# Patient Record
Sex: Male | Born: 1985
Health system: Southern US, Community
[De-identification: ages and names within clinical notes are randomized; demographics above are authoritative.]

## PROBLEM LIST (undated history)

## (undated) DIAGNOSIS — U071 COVID-19: Secondary | ICD-10-CM

## (undated) DIAGNOSIS — K589 Irritable bowel syndrome without diarrhea: Secondary | ICD-10-CM

## (undated) DIAGNOSIS — L509 Urticaria, unspecified: Secondary | ICD-10-CM

## (undated) DIAGNOSIS — D693 Immune thrombocytopenic purpura: Secondary | ICD-10-CM

## (undated) DIAGNOSIS — F419 Anxiety disorder, unspecified: Secondary | ICD-10-CM

## (undated) DIAGNOSIS — F319 Bipolar disorder, unspecified: Secondary | ICD-10-CM

## (undated) DIAGNOSIS — G4730 Sleep apnea, unspecified: Secondary | ICD-10-CM

## (undated) DIAGNOSIS — F32A Depression, unspecified: Secondary | ICD-10-CM

## (undated) DIAGNOSIS — I10 Essential (primary) hypertension: Secondary | ICD-10-CM

## (undated) DIAGNOSIS — F329 Major depressive disorder, single episode, unspecified: Secondary | ICD-10-CM

## (undated) DIAGNOSIS — K602 Anal fissure, unspecified: Secondary | ICD-10-CM

## (undated) DIAGNOSIS — R519 Headache, unspecified: Secondary | ICD-10-CM

## (undated) HISTORY — DX: Sleep apnea, unspecified: G47.30

## (undated) HISTORY — DX: Urticaria, unspecified: L50.9

## (undated) HISTORY — PX: COLONOSCOPY: SHX174

## (undated) HISTORY — DX: Headache, unspecified: R51.9

## (undated) HISTORY — DX: Depression, unspecified: F32.A

## (undated) HISTORY — DX: Major depressive disorder, single episode, unspecified: F32.9

## (undated) HISTORY — DX: Bipolar disorder, unspecified: F31.9

## (undated) HISTORY — PX: POLYPECTOMY: SHX149

## (undated) HISTORY — DX: Anal fissure, unspecified: K60.2

---
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
## 2005-03-03 ENCOUNTER — Emergency Department (HOSPITAL_COMMUNITY): Admission: EM | Admit: 2005-03-03 | Discharge: 2005-03-03 | Payer: Self-pay | Admitting: *Deleted

## 2005-07-28 ENCOUNTER — Emergency Department (HOSPITAL_COMMUNITY): Admission: EM | Admit: 2005-07-28 | Discharge: 2005-07-28 | Payer: Self-pay | Admitting: Emergency Medicine

## 2005-11-28 HISTORY — PX: UPPER GASTROINTESTINAL ENDOSCOPY: SHX188

## 2006-02-15 ENCOUNTER — Ambulatory Visit (HOSPITAL_COMMUNITY): Admission: RE | Admit: 2006-02-15 | Discharge: 2006-02-15 | Payer: Self-pay | Admitting: *Deleted

## 2006-02-23 ENCOUNTER — Emergency Department (HOSPITAL_COMMUNITY): Admission: EM | Admit: 2006-02-23 | Discharge: 2006-02-23 | Payer: Self-pay | Admitting: Emergency Medicine

## 2014-04-02 LAB — BASIC METABOLIC PANEL
BUN: 16 mg/dL (ref 4–21)
Creatinine: 1.5 mg/dL — AB (ref 0.6–1.3)
GLUCOSE: 98 mg/dL
POTASSIUM: 4.2 mmol/L (ref 3.4–5.3)
Sodium: 141 mmol/L (ref 137–147)

## 2014-04-02 LAB — CBC AND DIFFERENTIAL
HEMATOCRIT: 39 % — AB (ref 41–53)
HEMOGLOBIN: 14.1 g/dL (ref 13.5–17.5)
PLATELETS: 153 10*3/uL (ref 150–399)
WBC: 5.8 10^3/mL

## 2014-04-02 LAB — TSH: TSH: 1 u[IU]/mL (ref 0.41–5.90)

## 2014-12-29 ENCOUNTER — Encounter: Payer: Self-pay | Admitting: Family Medicine

## 2014-12-29 ENCOUNTER — Ambulatory Visit (INDEPENDENT_AMBULATORY_CARE_PROVIDER_SITE_OTHER): Payer: No Typology Code available for payment source | Admitting: Family Medicine

## 2014-12-29 VITALS — BP 147/92 | HR 116 | Temp 97.9°F | Ht 70.0 in | Wt 178.2 lb

## 2014-12-29 DIAGNOSIS — F329 Major depressive disorder, single episode, unspecified: Secondary | ICD-10-CM

## 2014-12-29 DIAGNOSIS — Z0000 Encounter for general adult medical examination without abnormal findings: Secondary | ICD-10-CM

## 2014-12-29 DIAGNOSIS — R5383 Other fatigue: Secondary | ICD-10-CM | POA: Insufficient documentation

## 2014-12-29 DIAGNOSIS — F331 Major depressive disorder, recurrent, moderate: Secondary | ICD-10-CM | POA: Insufficient documentation

## 2014-12-29 DIAGNOSIS — D693 Immune thrombocytopenic purpura: Secondary | ICD-10-CM

## 2014-12-29 DIAGNOSIS — Z79899 Other long term (current) drug therapy: Secondary | ICD-10-CM | POA: Insufficient documentation

## 2014-12-29 DIAGNOSIS — F32A Depression, unspecified: Secondary | ICD-10-CM

## 2014-12-29 LAB — BASIC METABOLIC PANEL
BUN: 15 mg/dL (ref 6–23)
CALCIUM: 9.3 mg/dL (ref 8.4–10.5)
CO2: 25 mEq/L (ref 19–32)
CREATININE: 1.5 mg/dL — AB (ref 0.50–1.35)
Chloride: 104 mEq/L (ref 96–112)
Glucose, Bld: 96 mg/dL (ref 70–99)
Potassium: 4.1 mEq/L (ref 3.5–5.3)
Sodium: 139 mEq/L (ref 135–145)

## 2014-12-29 LAB — LIPID PANEL
CHOLESTEROL: 180 mg/dL (ref 0–200)
HDL: 52 mg/dL (ref >39)
LDL Cholesterol: 97 mg/dL (ref 0–99)
Total CHOL/HDL Ratio: 3.5 Ratio
Triglycerides: 154 mg/dL — ABNORMAL HIGH (ref <150)
VLDL: 31 mg/dL (ref 0–40)

## 2014-12-29 LAB — CBC
HCT: 42.4 % (ref 39.0–52.0)
HEMOGLOBIN: 15.3 g/dL (ref 13.0–17.0)
MCH: 32.4 pg (ref 26.0–34.0)
MCHC: 36.1 g/dL — AB (ref 30.0–36.0)
MCV: 89.8 fL (ref 78.0–100.0)
MPV: 8.4 fL — AB (ref 8.6–12.4)
Platelets: 148 10*3/uL — ABNORMAL LOW (ref 150–400)
RBC: 4.72 MIL/uL (ref 4.22–5.81)
RDW: 13.4 % (ref 11.5–15.5)
WBC: 8.5 10*3/uL (ref 4.0–10.5)

## 2014-12-29 NOTE — Assessment & Plan Note (Signed)
Patient presents to establish care. He wishes to discuss fatigue/insomnia. I think that this is multifactorial from MDD and Anxiety. He has attempted multiple medications for sleeping including Trazadone/Melatonin/and Diphenhydramine. Has had some benefit from Ambien but wants to continue to wean. Previously found to be B12 deficient and Vitamin D deficient however did not respond to therapy. Currently on Seroquel and Klonopin for treatment of MDD. - check basic labs including TSH/CBC/BMP - encouraged increasing exercise to 4-5 times per week to naturally increase serotonin and to increase physical fatigue which may help with sleep

## 2014-12-29 NOTE — Patient Instructions (Signed)
It is nice to meet you today.  Poor sleep - lets attempt to increase daily exercise to 4-5 times per week, also check lab work including thyroid  Also check basic lab work, Dr. Ree Kida will call you with those results.

## 2014-12-29 NOTE — Progress Notes (Signed)
   Subjective:    Patient ID: Douglas Knight, male    DOB: 04-May-1986, 29 y.o.   MRN: [id number]  HPI 29 y/o male presents to establish care.   Reviewed New Patient Health History. See scanned document. Reviewed and updated PMH/PSH/Medications/Allergies/Social history in EPIC.   Patient would like to discuss insomnia and chronic fatigue. Patient does have a history of MDD which is currently treated by Dr. Pearson Grippe (psychiatry) and Dr. Barnabas Lister (psychology). Taking Seroquel, Klonopin, and Ambien currently. Reports difficulty falling asleep and awakes frequently, will often miss early morning appointments as he is unable to wake up, has attempted multiple medications for sleep including Trazodone, Melatonin, and Diphenhydramine. 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 Negative for fever and chills.  Respiratory: Negative for cough and shortness of breath.   Cardiovascular: Negative for chest pain.  Gastrointestinal: Negative for nausea, vomiting and diarrhea.       Objective:   Physical Exam Vitals: reviewed HEENT: normocephalic, PERRL, EOMI, no scleral icterus, MMM, neck supple, no thyromegaly Cardiac: tachycardic, S1 and S2 present, no murmur, no heaves/thrills Resp: CTAB, normal effort ABD: soft, no tenderness, normal bowel sounds Ext: no edema Skin: no rash Psych: dressed  appropriately, affects is outgoing, mood is described as down/depressed, normal thought process, does not appear to be reacting to internal stimuli     Assessment & Plan:  Please see problem specific assessment and plan.

## 2014-12-30 ENCOUNTER — Telehealth: Payer: Self-pay | Admitting: Family Medicine

## 2014-12-30 DIAGNOSIS — R7989 Other specified abnormal findings of blood chemistry: Secondary | ICD-10-CM

## 2014-12-30 LAB — TSH: TSH: 3.611 u[IU]/mL (ref 0.350–4.500)

## 2014-12-31 DIAGNOSIS — R7989 Other specified abnormal findings of blood chemistry: Secondary | ICD-10-CM | POA: Insufficient documentation

## 2014-12-31 NOTE — Telephone Encounter (Signed)
Pt scheduled Wednesday feb 10th @ 8:30. Deseree Kennon Holter, CMA

## 2014-12-31 NOTE — Telephone Encounter (Signed)
Discussed lab results. Triglycerides mildly elevated however patient had eaten prior to exam. Will recheck in one year. Cr slightly elevated. Denies previous renal disease, no current supplements. Will check UA and BMP in one week.  Note to nursing staff - please call to schedule lab draw for BMP and UA in one week.

## 2015-01-07 ENCOUNTER — Other Ambulatory Visit: Payer: No Typology Code available for payment source

## 2015-01-07 DIAGNOSIS — R7989 Other specified abnormal findings of blood chemistry: Secondary | ICD-10-CM

## 2015-01-07 LAB — BASIC METABOLIC PANEL
BUN: 11 mg/dL (ref 6–23)
CALCIUM: 9.4 mg/dL (ref 8.4–10.5)
CO2: 24 mEq/L (ref 19–32)
CREATININE: 1.34 mg/dL (ref 0.50–1.35)
Chloride: 105 mEq/L (ref 96–112)
GLUCOSE: 84 mg/dL (ref 70–99)
Potassium: 3.8 mEq/L (ref 3.5–5.3)
SODIUM: 141 meq/L (ref 135–145)

## 2015-01-07 NOTE — Progress Notes (Signed)
Bmp done today Douglas Knight 

## 2015-01-09 NOTE — Progress Notes (Signed)
Left voicemail stating normal results, call if he has questions.

## 2015-01-15 ENCOUNTER — Emergency Department (HOSPITAL_COMMUNITY): Payer: No Typology Code available for payment source

## 2015-01-15 ENCOUNTER — Encounter (HOSPITAL_COMMUNITY): Payer: Self-pay | Admitting: *Deleted

## 2015-01-15 ENCOUNTER — Telehealth: Payer: Self-pay | Admitting: Family Medicine

## 2015-01-15 ENCOUNTER — Emergency Department (HOSPITAL_COMMUNITY)
Admission: EM | Admit: 2015-01-15 | Discharge: 2015-01-15 | Disposition: A | Discharge status: Home / Self Care | Payer: No Typology Code available for payment source | Attending: Emergency Medicine | Admitting: Emergency Medicine

## 2015-01-15 DIAGNOSIS — Z79899 Other long term (current) drug therapy: Secondary | ICD-10-CM | POA: Insufficient documentation

## 2015-01-15 DIAGNOSIS — R0602 Shortness of breath: Secondary | ICD-10-CM | POA: Diagnosis present

## 2015-01-15 DIAGNOSIS — R200 Anesthesia of skin: Secondary | ICD-10-CM | POA: Insufficient documentation

## 2015-01-15 DIAGNOSIS — R0689 Other abnormalities of breathing: Secondary | ICD-10-CM | POA: Insufficient documentation

## 2015-01-15 DIAGNOSIS — R110 Nausea: Secondary | ICD-10-CM | POA: Insufficient documentation

## 2015-01-15 DIAGNOSIS — F419 Anxiety disorder, unspecified: Secondary | ICD-10-CM | POA: Insufficient documentation

## 2015-01-15 LAB — I-STAT VENOUS BLOOD GAS, ED
BICARBONATE: 23.2 meq/L (ref 20.0–24.0)
O2 SAT: 78 %
PCO2 VEN: 34.1 mmHg — AB (ref 45.0–50.0)
PO2 VEN: 40 mmHg (ref 30.0–45.0)
TCO2: 24 mmol/L (ref 0–100)
pH, Ven: 7.44 — ABNORMAL HIGH (ref 7.250–7.300)

## 2015-01-15 LAB — CBC
HCT: 41.9 % (ref 39.0–52.0)
HEMOGLOBIN: 15.3 g/dL (ref 13.0–17.0)
MCH: 32.8 pg (ref 26.0–34.0)
MCHC: 36.5 g/dL — AB (ref 30.0–36.0)
MCV: 89.9 fL (ref 78.0–100.0)
PLATELETS: 130 10*3/uL — AB (ref 150–400)
RBC: 4.66 MIL/uL (ref 4.22–5.81)
RDW: 12.5 % (ref 11.5–15.5)
WBC: 4.6 10*3/uL (ref 4.0–10.5)

## 2015-01-15 LAB — I-STAT TROPONIN, ED: Troponin i, poc: 0 ng/mL (ref 0.00–0.08)

## 2015-01-15 LAB — BASIC METABOLIC PANEL
ANION GAP: 8 (ref 5–15)
BUN: 9 mg/dL (ref 6–23)
CALCIUM: 9.3 mg/dL (ref 8.4–10.5)
CO2: 27 mmol/L (ref 19–32)
CREATININE: 1.36 mg/dL — AB (ref 0.50–1.35)
Chloride: 104 mmol/L (ref 96–112)
GFR calc Af Amer: 80 mL/min — ABNORMAL LOW (ref >90)
GFR, EST NON AFRICAN AMERICAN: 69 mL/min — AB (ref >90)
Glucose, Bld: 131 mg/dL — ABNORMAL HIGH (ref 70–99)
Potassium: 3.5 mmol/L (ref 3.5–5.1)
SODIUM: 139 mmol/L (ref 135–145)

## 2015-01-15 LAB — BRAIN NATRIURETIC PEPTIDE: B NATRIURETIC PEPTIDE 5: 3.9 pg/mL (ref 0.0–100.0)

## 2015-01-15 MED ORDER — LORAZEPAM 1 MG PO TABS
2.0000 mg | ORAL_TABLET | Freq: Once | ORAL | Status: AC
  Administered 2015-01-15: 2 mg via ORAL
  Filled 2015-01-15: qty 2

## 2015-01-15 MED ORDER — LORAZEPAM 1 MG PO TABS: 1.0000 mg | ORAL_TABLET | Freq: Once | ORAL | Status: DC

## 2015-01-15 NOTE — Telephone Encounter (Signed)
Erman Thum is a 29 y.o. ,Pt calls the after hours line to discuss his breathing. He states he is unable to catch his full breath when he "yawns". He has no history of shortness of breath, asthma, COPD, lung disease, CHF etc. He does have a history of depression/anxity, and ITP. He denies fever or cough, but states he is dizzy. He is treated by psychiatry Albertine Patricia) and psychology Ruthann Cancer).  - Unlikely lung pathology. Pt sounded very anxious on the phone. He was able to speak ijn full sentences without sounding winded and if anything had pressured speech.  He is a new pt to the clinic as of 2/1, thus not much information available in the records.  - Advised the patient if he felt he could wait until the morning he could make a same day appointment. If he felt that is breathing was difficult, labored or his dizziness was from his "nit being able to catch: his breath than he could go to an urgent care.  Howard Pouch DO PGY3 [facility]

## 2015-01-15 NOTE — ED Notes (Signed)
Rapid respiratiions with soime hand and arm numbness

## 2015-01-15 NOTE — ED Provider Notes (Signed)
CSN: [id number]     Arrival date & time 01/15/15  0244 History  This chart was scribed for Everlene Balls, MD by Rayfield Citizen, ED Scribe. This patient was seen in room A06C/A06C and the patient's care was started at 3:45 AM.    Chief Complaint  Patient presents with  . Shortness of Breath   The history is provided by the patient. No language interpreter was used.    HPI Comments: Douglas Knight is a 29 y.o. male who presents to the Emergency Department complaining of SOB. Patient reports a "constant yawning" sensation with nausea, lightheadedness and "numbness" beginning tonight around 22:00 while watching basketball on TV. He notes a recent nonproductive cough. He denies chest pain, vomiting, abdominal pain, recent travel, recent surgery, recent illness.   History reviewed. No pertinent past medical history. Past Surgical History  Procedure Laterality Date  . Appendectomy  2003    [facility]   Family History  Problem Relation Age of Onset  . Diabetes Father   . Hypertension Father   . Heart disease Maternal Grandfather   . Alzheimer's disease Paternal Grandmother   . Colon cancer Paternal Grandfather   . Depression Paternal Grandfather    History  Substance Use Topics  . Smoking status: Never Smoker   . Smokeless tobacco: Not on file  . Alcohol Use: No    Review of Systems  Psychiatric/Behavioral: The patient is nervous/anxious.   All other systems reviewed and are negative.   Allergies  Sulfa antibiotics and Wellbutrin  Home Medications   Prior to Admission medications   Medication Sig Start Date End Date Taking? Authorizing Provider  clonazePAM (KLONOPIN) 0.5 MG tablet Take 0.5 mg by mouth 2 (two) times daily as needed for anxiety.   Yes Historical Provider, MD  QUEtiapine (SEROQUEL) 300 MG tablet Take 300 mg by mouth at bedtime.   Yes Historical Provider, MD  Vortioxetine HBr 5 MG TABS Take 1 tablet by mouth daily.   Yes Historical Provider, MD   zolpidem (AMBIEN) 10 MG tablet Take 5 mg by mouth at bedtime as needed for sleep.   Yes Historical Provider, MD   BP 156/94 mmHg  Pulse 98  Temp(Src) 97.7 F (36.5 C) (Oral)  Resp 11  Ht 5\' 10"  (1.778 m)  Wt 175 lb (79.379 kg)  BMI 25.11 kg/m2  SpO2 100% Physical Exam  Constitutional: He is oriented to person, place, and time. Vital signs are normal. He appears well-developed and well-nourished.  Non-toxic appearance. He does not appear ill. No distress.  HENT:  Head: Normocephalic and atraumatic.  Nose: Nose normal.  Mouth/Throat: Oropharynx is clear and moist. No oropharyngeal exudate.  Eyes: Conjunctivae and EOM are normal. Pupils are equal, round, and reactive to light. No scleral icterus.  Neck: Normal range of motion. Neck supple. No tracheal deviation, no edema, no erythema and normal range of motion present. No thyroid mass and no thyromegaly present.  Cardiovascular: Normal rate, regular rhythm, S1 normal, S2 normal, normal heart sounds, intact distal pulses and normal pulses.  Exam reveals no gallop and no friction rub.   No murmur heard. Pulses:      Radial pulses are 2+ on the right side, and 2+ on the left side.       Dorsalis pedis pulses are 2+ on the right side, and 2+ on the left side.  Pulmonary/Chest: Effort normal and breath sounds normal. No respiratory distress. He has no wheezes. He has no rhonchi. He has no rales.  Abdominal: Soft. Normal appearance and bowel sounds are normal. He exhibits no distension, no ascites and no mass. There is no hepatosplenomegaly. There is no tenderness. There is no rebound, no guarding and no CVA tenderness.  Musculoskeletal: Normal range of motion. He exhibits no edema or tenderness.  Lymphadenopathy:    He has no cervical adenopathy.  Neurological: He is alert and oriented to person, place, and time. He has normal strength. No cranial nerve deficit or sensory deficit.  Skin: Skin is warm, dry and intact. No petechiae and no rash  noted. He is not diaphoretic. No erythema. No pallor.  Psychiatric: His behavior is normal. Judgment normal. His mood appears anxious.  Nursing note and vitals reviewed.   ED Course  Procedures   DIAGNOSTIC STUDIES: Oxygen Saturation is 99% on RA, normal by my interpretation.    COORDINATION OF CARE: 3:55 AM Discussed treatment plan with pt at bedside and pt agreed to plan.   Labs Review Labs Reviewed  CBC - Abnormal; Notable for the following:    MCHC 36.5 (*)    Platelets 130 (*)    All other components within normal limits  BASIC METABOLIC PANEL - Abnormal; Notable for the following:    Glucose, Bld 131 (*)    Creatinine, Ser 1.36 (*)    GFR calc non Af Amer 69 (*)    GFR calc Af Amer 80 (*)    All other components within normal limits  I-STAT VENOUS BLOOD GAS, ED - Abnormal; Notable for the following:    pH, Ven 7.440 (*)    pCO2, Ven 34.1 (*)    All other components within normal limits  BRAIN NATRIURETIC PEPTIDE  I-STAT TROPOININ, ED    Imaging Review Dg Chest 2 View  01/15/2015   CLINICAL DATA:  Nausea vomiting dyspnea  EXAM: CHEST  2 VIEW  COMPARISON:  None.  FINDINGS: The heart size and mediastinal contours are within normal limits. Both lungs are clear. The visualized skeletal structures are unremarkable.  IMPRESSION: No active cardiopulmonary disease.   Electronically Signed   By: Andreas Newport M.D.   On: 01/15/2015 03:11     EKG Interpretation   Date/Time:  Thursday January 15 2015 04:07:12 EST Ventricular Rate:  85 PR Interval:  150 QRS Duration: 94 QT Interval:  344 QTC Calculation: 409 R Axis:   90 Text Interpretation:  Sinus rhythm Borderline right axis deviation RSR' in  V1 or V2, probably normal variant Confirmed by Glynn Octave  [phone]) on 01/15/2015 4:20:31 AM      MDM   Final diagnoses:  None  Patient presents to the ED for SOB.  This occurred spontaneously while watching basketball.  He denies any chest pain.  This is  consistent with his anxiety, however it is lasting longer than normal.  He took his normal home meds without relief.  He denies any chest pain at anytime.  He has no risk factors for PE and his history is not consistent with ACS, pericarditis, or dissection.  His tachycardia has resolved after ativan 2mg  PO, HR 97 at 06:04am.  He was advised to follow up with his primary care physician.  His VS remain within his normal limits and he is safe for DC.   I personally performed the services described in this documentation, which was scribed in my presence. The recorded information has been reviewed and is accurate.   Everlene Balls, MD 01/15/15 [phone]

## 2015-01-15 NOTE — Discharge Instructions (Signed)
Shortness of Breath Douglas Knight, your blood work, ekg, and chest xray were normal.  Follow up with your regular physician within 3 days. If symptoms worsen come back to emergency department immediately. Thank you. Shortness of breath means you have trouble breathing. Shortness of breath needs medical care right away. HOME CARE   Do not smoke.  Avoid being around chemicals or things (paint fumes, dust) that may bother your breathing.  Rest as needed. Slowly begin your normal activities.  Only take medicines as told by your doctor.  Keep all doctor visits as told. GET HELP RIGHT AWAY IF:   Your shortness of breath gets worse.  You feel lightheaded, pass out (faint), or have a cough that is not helped by medicine.  You cough up blood.  You have pain with breathing.  You have pain in your chest, arms, shoulders, or belly (abdomen).  You have a fever.  You cannot walk up stairs or exercise the way you normally do.  You do not get better in the time expected.  You have a hard time doing normal activities even with rest.  You have problems with your medicines.  You have any new symptoms. MAKE SURE YOU:  Understand these instructions.  Will watch your condition.  Will get help right away if you are not doing well or get worse. Document Released: 05/02/2008 Document Revised: 11/19/2013 Document Reviewed: 01/30/2012 [facility] Patient Information 2015 [place], [place]. This information is not intended to replace advice given to you by your health care provider. Make sure you discuss any questions you have with your health care provider.

## 2015-01-15 NOTE — ED Notes (Signed)
The pt is c/o sob while watching basketball approx 2 hours ago. No pain.  Hx iof anxiety attacks.  He took klonopin 6 hours ago

## 2015-01-15 NOTE — ED Notes (Signed)
MD at bedside. 

## 2015-02-01 ENCOUNTER — Emergency Department (HOSPITAL_COMMUNITY): Payer: No Typology Code available for payment source

## 2015-02-01 ENCOUNTER — Encounter (HOSPITAL_COMMUNITY): Payer: Self-pay | Admitting: Emergency Medicine

## 2015-02-01 ENCOUNTER — Encounter (HOSPITAL_COMMUNITY): Payer: Self-pay | Admitting: *Deleted

## 2015-02-01 ENCOUNTER — Emergency Department (HOSPITAL_COMMUNITY)
Admission: EM | Admit: 2015-02-01 | Discharge: 2015-02-01 | Disposition: A | Discharge status: Home / Self Care | Payer: No Typology Code available for payment source | Attending: Emergency Medicine | Admitting: Emergency Medicine

## 2015-02-01 ENCOUNTER — Emergency Department (INDEPENDENT_AMBULATORY_CARE_PROVIDER_SITE_OTHER)
Admission: EM | Admit: 2015-02-01 | Discharge: 2015-02-01 | Disposition: A | Discharge status: Nursing Home (Includes ICF) | Payer: No Typology Code available for payment source | Source: Home / Self Care | Attending: Emergency Medicine | Admitting: Emergency Medicine

## 2015-02-01 DIAGNOSIS — F458 Other somatoform disorders: Secondary | ICD-10-CM | POA: Diagnosis not present

## 2015-02-01 DIAGNOSIS — R0602 Shortness of breath: Secondary | ICD-10-CM

## 2015-02-01 DIAGNOSIS — Z862 Personal history of diseases of the blood and blood-forming organs and certain disorders involving the immune mechanism: Secondary | ICD-10-CM | POA: Insufficient documentation

## 2015-02-01 DIAGNOSIS — I10 Essential (primary) hypertension: Secondary | ICD-10-CM | POA: Diagnosis not present

## 2015-02-01 DIAGNOSIS — R202 Paresthesia of skin: Secondary | ICD-10-CM | POA: Insufficient documentation

## 2015-02-01 DIAGNOSIS — Z79899 Other long term (current) drug therapy: Secondary | ICD-10-CM | POA: Insufficient documentation

## 2015-02-01 DIAGNOSIS — F419 Anxiety disorder, unspecified: Secondary | ICD-10-CM | POA: Diagnosis not present

## 2015-02-01 HISTORY — DX: Anxiety disorder, unspecified: F41.9

## 2015-02-01 HISTORY — DX: Immune thrombocytopenic purpura: D69.3

## 2015-02-01 HISTORY — DX: Essential (primary) hypertension: I10

## 2015-02-01 LAB — POCT I-STAT, CHEM 8
BUN: 7 mg/dL (ref 6–23)
CREATININE: 1.3 mg/dL (ref 0.50–1.35)
Calcium, Ion: 1.14 mmol/L (ref 1.12–1.23)
Chloride: 104 mmol/L (ref 96–112)
GLUCOSE: 93 mg/dL (ref 70–99)
HCT: 42 % (ref 39.0–52.0)
Hemoglobin: 14.3 g/dL (ref 13.0–17.0)
Potassium: 3.4 mmol/L — ABNORMAL LOW (ref 3.5–5.1)
Sodium: 141 mmol/L (ref 135–145)
TCO2: 20 mmol/L (ref 0–100)

## 2015-02-01 LAB — CBC WITH DIFFERENTIAL/PLATELET
BASOS PCT: 1 % (ref 0–1)
Basophils Absolute: 0.1 10*3/uL (ref 0.0–0.1)
Eosinophils Absolute: 0.1 10*3/uL (ref 0.0–0.7)
Eosinophils Relative: 1 % (ref 0–5)
HCT: 39.4 % (ref 39.0–52.0)
HEMOGLOBIN: 14.4 g/dL (ref 13.0–17.0)
LYMPHS ABS: 1.3 10*3/uL (ref 0.7–4.0)
Lymphocytes Relative: 15 % (ref 12–46)
MCH: 32.3 pg (ref 26.0–34.0)
MCHC: 36.5 g/dL — AB (ref 30.0–36.0)
MCV: 88.3 fL (ref 78.0–100.0)
MONO ABS: 0.6 10*3/uL (ref 0.1–1.0)
Monocytes Relative: 7 % (ref 3–12)
NEUTROS ABS: 6.6 10*3/uL (ref 1.7–7.7)
Neutrophils Relative %: 76 % (ref 43–77)
Platelets: 143 10*3/uL — ABNORMAL LOW (ref 150–400)
RBC: 4.46 MIL/uL (ref 4.22–5.81)
RDW: 12.1 % (ref 11.5–15.5)
WBC: 8.5 10*3/uL (ref 4.0–10.5)

## 2015-02-01 LAB — BASIC METABOLIC PANEL
ANION GAP: 4 — AB (ref 5–15)
BUN: 7 mg/dL (ref 6–23)
CALCIUM: 8.8 mg/dL (ref 8.4–10.5)
CHLORIDE: 105 mmol/L (ref 96–112)
CO2: 30 mmol/L (ref 19–32)
Creatinine, Ser: 1.36 mg/dL — ABNORMAL HIGH (ref 0.50–1.35)
GFR calc non Af Amer: 69 mL/min — ABNORMAL LOW (ref >90)
GFR, EST AFRICAN AMERICAN: 80 mL/min — AB (ref >90)
Glucose, Bld: 96 mg/dL (ref 70–99)
POTASSIUM: 3.5 mmol/L (ref 3.5–5.1)
Sodium: 139 mmol/L (ref 135–145)

## 2015-02-01 MED ORDER — IPRATROPIUM-ALBUTEROL 0.5-2.5 (3) MG/3ML IN SOLN
3.0000 mL | Freq: Once | RESPIRATORY_TRACT | Status: AC
  Administered 2015-02-01: 3 mL via RESPIRATORY_TRACT
  Filled 2015-02-01: qty 3

## 2015-02-01 MED ORDER — MORPHINE SULFATE 2 MG/ML IJ SOLN
2.0000 mg | Freq: Once | INTRAMUSCULAR | Status: AC
  Administered 2015-02-01: 2 mg via INTRAMUSCULAR

## 2015-02-01 MED ORDER — MORPHINE SULFATE 2 MG/ML IJ SOLN
INTRAMUSCULAR | Status: AC
  Filled 2015-02-01: qty 1

## 2015-02-01 MED ORDER — LORAZEPAM 1 MG PO TABS
1.0000 mg | ORAL_TABLET | Freq: Once | ORAL | Status: AC
  Administered 2015-02-01: 1 mg via ORAL
  Filled 2015-02-01: qty 1

## 2015-02-01 NOTE — ED Notes (Signed)
Patient transported to X-ray 

## 2015-02-01 NOTE — ED Provider Notes (Signed)
CSN: [id number]     Arrival date & time 02/01/15  1836 History   First MD Initiated Contact with Patient 02/01/15 1915     Chief Complaint  Patient presents with  . Shortness of Breath  . Tingling     (Consider location/radiation/quality/duration/timing/severity/associated sxs/prior Treatment) HPI Douglas Knight is a 29 y.o. male with history of hypertension, anxiety who comes in for evaluation of shortness of breath. Patient states he has multiple episodes where he feels like he is "constantly yawning" and is unable to catch his breath. He reports being seen last week in the ED for similar symptoms and was discharged home. He saw his PCP today at an urgent care center and was transferred to the ED for further evaluation. Patient reports associated generalized tingling diffusely throughout his chest and abdomen with no overt pain. He also reports 1 episode of nausea today without vomiting that was fleeting. He reports being recently diagnosed with hypertension, started on amlodipine which he took today, but that did not resolve his symptoms. Denies any hemoptysis, for/history of cancer, leg swelling, recent travels or surgeries.  Past Medical History  Diagnosis Date  . Hypertension   . Anxiety   . ITP (idiopathic thrombocytopenic purpura)    Past Surgical History  Procedure Laterality Date  . Appendectomy  2003    [facility]   Family History  Problem Relation Age of Onset  . Diabetes Father   . Hypertension Father   . Heart disease Maternal Grandfather   . Alzheimer's disease Paternal Grandmother   . Colon cancer Paternal Grandfather   . Depression Paternal Grandfather    History  Substance Use Topics  . Smoking status: Never Smoker   . Smokeless tobacco: Not on file  . Alcohol Use: No    Review of Systems A 10 point review of systems was completed and was negative except for pertinent positives and negatives as mentioned in the history of present  illness     Allergies  Sulfa antibiotics and Wellbutrin  Home Medications   Prior to Admission medications   Medication Sig Start Date End Date Taking? Authorizing Provider  ALPRAZolam Duanne Moron) 1 MG tablet Take 0.5-1 mg by mouth 3 (three) times daily as needed for anxiety.   Yes Historical Provider, MD  amLODipine (NORVASC) 5 MG tablet Take 5 mg by mouth daily.   Yes Historical Provider, MD  QUEtiapine (SEROQUEL) 200 MG tablet Take 200 mg by mouth at bedtime.   Yes Historical Provider, MD  Vortioxetine HBr 10 MG TABS Take 10 mg by mouth every morning.   Yes Historical Provider, MD  zolpidem (AMBIEN) 10 MG tablet Take 5 mg by mouth at bedtime.    Yes Historical Provider, MD   BP 141/88 mmHg  Pulse 89  Temp(Src) 97.9 F (36.6 C) (Oral)  Resp 16  Ht 5\' 10"  (1.778 m)  Wt 175 lb (79.379 kg)  BMI 25.11 kg/m2  SpO2 100% Physical Exam  Constitutional: He is oriented to person, place, and time. He appears well-developed and well-nourished.  Appears anxious   HENT:  Head: Normocephalic and atraumatic.  Mouth/Throat: Oropharynx is clear and moist.  Eyes: Conjunctivae are normal. Pupils are equal, round, and reactive to light. Right eye exhibits no discharge. Left eye exhibits no discharge. No scleral icterus.  Neck: Neck supple.  Cardiovascular: Normal rate, regular rhythm and normal heart sounds.   Pulmonary/Chest: Effort normal and breath sounds normal. No respiratory distress. He has no wheezes. He has no rales.  Taking deep breaths  Abdominal: Soft. There is no tenderness.  Musculoskeletal: He exhibits no edema or tenderness.  Neurological: He is alert and oriented to person, place, and time.  Cranial Nerves II-XII grossly intact  Skin: Skin is warm and dry. No rash noted.  Psychiatric: He has a normal mood and affect.  Nursing note and vitals reviewed.   ED Course  Procedures (including critical care time) Labs Review Labs Reviewed  BASIC METABOLIC PANEL - Abnormal;  Notable for the following:    Creatinine, Ser 1.36 (*)    GFR calc non Af Amer 69 (*)    GFR calc Af Amer 80 (*)    Anion gap 4 (*)    All other components within normal limits  CBC WITH DIFFERENTIAL/PLATELET - Abnormal; Notable for the following:    MCHC 36.5 (*)    Platelets 143 (*)    All other components within normal limits  I-STAT TROPOININ, ED    Imaging Review Dg Chest 2 View  02/01/2015   CLINICAL DATA:  Shortness of breath, onset last night at 11 o'clock while lying down. No cold symptoms. Headache telephone on last night and today. Dizziness. Seen in the ED 1 month ago for the same symptoms. Elevated blood pressure. Aching and tingling in the left upper abdomen and left lower chest. Nausea and gagging.  EXAM: CHEST  2 VIEW  COMPARISON:  01/15/2015  FINDINGS: The heart size and mediastinal contours are within normal limits. Both lungs are clear. The visualized skeletal structures are unremarkable.  IMPRESSION: No active cardiopulmonary disease.   Electronically Signed   By: Lucienne Capers M.D.   On: 02/01/2015 20:07     EKG Interpretation   Date/Time:  Sunday February 01 2015 18:51:44 EST Ventricular Rate:  74 PR Interval:  151 QRS Duration: 97 QT Interval:  381 QTC Calculation: 423 R Axis:   91 Text Interpretation:  Sinus rhythm Borderline right axis deviation RSR' in  V1 or V2, probably normal variant since last tracing no significant change  Confirmed by [facility]  MD, ELLIOTT [phone]) on 02/01/2015 7:44:44 PM     Meds given in ED:  Medications  ipratropium-albuterol (DUONEB) 0.5-2.5 (3) MG/3ML nebulizer solution 3 mL (3 mLs Nebulization Given 02/01/15 2019)  LORazepam (ATIVAN) tablet 1 mg (1 mg Oral Given 02/01/15 2201)    Discharge Medication List as of 02/01/2015 10:25 PM     Filed Vitals:   02/01/15 2130 02/01/15 2145 02/01/15 2200 02/01/15 2243  BP: 144/87 142/83 150/83 141/88  Pulse: 99 100 94 89  Temp:    97.9 F (36.6 C)  TempSrc:    Oral  Resp:   17 16   Height:      Weight:      SpO2: 100% 98% 99% 100%    MDM  Vitals stable - WNL -afebrile, maintains saturations 99% on room air Pt resting comfortably in ED. PE--upon reevaluation, patient is very calm, no evidence of respiratory distress, and normal respirations Labwork noncontributory. Troponin negative, EKG reassuring Imaging--chest x-ray shows no acute cardiopulmonary pathology  DDX--clinical picture not consistent with ACS. Patient is perc negative. Low suspicion for dissection, esophageal rupture. No evidence of DKA. Symptoms improved with 1mg  PO Ativan, symptoms potentially due to psychogenic process. Discussed follow-up with psychiatrist for further evaluation and management of psych meds.  I discussed all relevant lab findings and imaging results with pt and they verbalized understanding. Discussed f/u with PCP within 48 hrs and return precautions, pt very amenable to  plan. Patient stable, in good condition and is appropriate for discharge  Final diagnoses:  SOB (shortness of breath)        Verl Dicker, PA-C 02/02/15 2761  Daleen Bo, MD 02/02/15 1110

## 2015-02-01 NOTE — ED Provider Notes (Addendum)
CSN: [id number]     Arrival date & time 02/01/15  1523 History   First MD Initiated Contact with Patient 02/01/15 1530     Chief Complaint  Patient presents with  . Shortness of Breath   (Consider location/radiation/quality/duration/timing/severity/associated sxs/prior Treatment) HPI  He is a 29 year old man here for evaluation of shortness of breath. He states this started last night. He states he feels like he just cannot get his breath. He also reports dizziness, numbness in his fingers and toes, and some discomfort in his left chest and abdomen. He reports nausea, but no vomiting. No fevers or cough. He states he had a similar episode about a month ago. At that time he was evaluated in the ER and treated with some Ativan. He states that shortly after that visit to the ER he was seen by another doctor and found to have elevated blood pressure and was treated with amlodipine. He has tried amlodipine today, but it has not improved his shortness of breath.  He has a history of depression and anxiety. No history of asthma. He is a nonsmoker.  History reviewed. No pertinent past medical history. Past Surgical History  Procedure Laterality Date  . Appendectomy  2003    [facility]   Family History  Problem Relation Age of Onset  . Diabetes Father   . Hypertension Father   . Heart disease Maternal Grandfather   . Alzheimer's disease Paternal Grandmother   . Colon cancer Paternal Grandfather   . Depression Paternal Grandfather    History  Substance Use Topics  . Smoking status: Never Smoker   . Smokeless tobacco: Not on file  . Alcohol Use: No    Review of Systems  Constitutional: Negative for fever and chills.  HENT: Negative for congestion and rhinorrhea.   Respiratory: Positive for shortness of breath. Negative for cough and wheezing.   Cardiovascular: Positive for chest pain.  Gastrointestinal: Positive for nausea and abdominal pain. Negative for vomiting.   Musculoskeletal: Negative for myalgias.  Neurological: Positive for dizziness and numbness.    Allergies  Sulfa antibiotics and Wellbutrin  Home Medications   Prior to Admission medications   Medication Sig Start Date End Date Taking? Authorizing Provider  ALPRAZolam Duanne Moron) 1 MG tablet Take 1 mg by mouth at bedtime as needed for anxiety.   Yes Historical Provider, MD  QUEtiapine (SEROQUEL) 300 MG tablet Take 300 mg by mouth at bedtime.   Yes Historical Provider, MD  Vortioxetine HBr 5 MG TABS Take 2 tablets by mouth daily.    Yes Historical Provider, MD  zolpidem (AMBIEN) 10 MG tablet Take 5 mg by mouth at bedtime as needed for sleep.   Yes Historical Provider, MD  clonazePAM (KLONOPIN) 0.5 MG tablet Take 0.5 mg by mouth 2 (two) times daily as needed for anxiety.    Historical Provider, MD   BP 132/76 mmHg  Pulse 104  Temp(Src) 97.9 F (36.6 C) (Oral)  Resp 24  SpO2 100% Physical Exam  Constitutional: He is oriented to person, place, and time. He appears well-developed and well-nourished. He appears distressed (looks anxious, taking deep breaths.).  Cardiovascular: Regular rhythm and normal heart sounds.  Tachycardia present.   No murmur heard. Pulmonary/Chest: Breath sounds normal. He has no wheezes. He has no rales.  He is taking deep breaths every few seconds.  Neurological: He is alert and oriented to person, place, and time.    ED Course  ED EKG  Date/Time: 02/01/2015 4:15 PM Performed  by: Melony Overly Authorized by: Melony Overly Interpreted by ED physician Comparison: compared with previous ECG from 01/15/2015 Similar to previous ECG Rhythm: sinus rhythm Rate: normal BPM: 90 QRS axis: right Conduction: conduction normal ST Segments: ST segments normal T Waves: T waves normal Clinical impression: abnormal ECG Comments: Rightward axis, but stable from prior EKG.  I have personally reviewed this EKG and agree with the computer printout.   (including critical  care time) Labs Review Labs Reviewed  POCT I-STAT, CHEM 8 - Abnormal; Notable for the following:    Potassium 3.4 (*)    All other components within normal limits    Imaging Review No results found.   MDM   1. Hyperventilation syndrome    Morphine 2 mg IM given for air hunger.  He is hyperventilating. This is likely causing his other symptoms. I do not have a clear reason for him to hyperventilate. He did start all of his psychiatric medications shortly before his first episode of hyperventilation. No real improvement after morphine. I'm concerned about discharging him home in his current state as I think he will likely pass out from hyperventilation. We'll transfer to [facility] ER via EMS for additional management.    Melony Overly, MD 02/01/15 [place], MD 02/06/15 [phone]

## 2015-02-01 NOTE — ED Notes (Addendum)
[facility] not available.  I called [facility] and they are coming. Lips dry given sips of water. States headache is worse.  C/o nausea.

## 2015-02-01 NOTE — Discharge Instructions (Signed)
Shortness of Breath Shortness of breath means you have trouble breathing. It could also mean that you have a medical problem. You should get immediate medical care for shortness of breath. CAUSES   Not enough oxygen in the air such as with high altitudes or a smoke-filled room.  Certain lung diseases, infections, or problems.  Heart disease or conditions, such as angina or heart failure.  Low red blood cells (anemia).  Poor physical fitness, which can cause shortness of breath when you exercise.  Chest or back injuries or stiffness.  Being overweight.  Smoking.  Anxiety, which can make you feel like you are not getting enough air. DIAGNOSIS  Serious medical problems can often be found during your physical exam. Tests may also be done to determine why you are having shortness of breath. Tests may include:  Chest X-rays.  Lung function tests.  Blood tests.  An electrocardiogram (ECG).  An ambulatory electrocardiogram. An ambulatory ECG records your heartbeat patterns over a 24-hour period.  Exercise testing.  A transthoracic echocardiogram (TTE). During echocardiography, sound waves are used to evaluate how blood flows through your heart.  A transesophageal echocardiogram (TEE).  Imaging scans. Your health care provider may not be able to find a cause for your shortness of breath after your exam. In this case, it is important to have a follow-up exam with your health care provider as directed.  TREATMENT  Treatment for shortness of breath depends on the cause of your symptoms and can vary greatly. HOME CARE INSTRUCTIONS   Do not smoke. Smoking is a common cause of shortness of breath. If you smoke, ask for help to quit.  Avoid being around chemicals or things that may bother your breathing, such as paint fumes and dust.  Rest as needed. Slowly resume your usual activities.  If medicines were prescribed, take them as directed for the full length of time directed. This  includes oxygen and any inhaled medicines.  Keep all follow-up appointments as directed by your health care provider. SEEK MEDICAL CARE IF:   Your condition does not improve in the time expected.  You have a hard time doing your normal activities even with rest.  You have any new symptoms. SEEK IMMEDIATE MEDICAL CARE IF:   Your shortness of breath gets worse.  You feel light-headed, faint, or develop a cough not controlled with medicines.  You start coughing up blood.  You have pain with breathing.  You have chest pain or pain in your arms, shoulders, or abdomen.  You have a fever.  You are unable to walk up stairs or exercise the way you normally do. MAKE SURE YOU:  Understand these instructions.  Will watch your condition.  Will get help right away if you are not doing well or get worse. Document Released: 08/09/2001 Document Revised: 11/19/2013 Document Reviewed: 01/30/2012 [facility] Patient Information 2015 [place], [place]. This information is not intended to replace advice given to you by your health care provider. Make sure you discuss any questions you have with your health care provider.  Your evaluation in the ED today did not reveal an emergent cause for your symptoms at this time. He had normal labs and chest x-ray today. It is important for you to follow-up with your primary care and/or your psychiatrist for further evaluation and management of your symptoms. Return to ED for new or worsening symptoms.

## 2015-02-01 NOTE — ED Notes (Signed)
Per EMS: Pt from [facility] for eval of sob and generalized tingling since last night, pt with hx of anxiety but feels this is different. Pt states recent htn dx and has been having trouble controlling bp, currently on amlodipine.  Denies any pain at this time, pt also reports some "chest tingling" that started last night. Reports left underarm pressure at this time. WAs given morphine at [facility] for "air hunger". NAD noted.

## 2015-02-01 NOTE — ED Notes (Addendum)
C/o SOB onset last night @ 1100 while lying down.  No cold symptoms. C/o headache off and on last night and today. C/o dizziness onset last night and today.  Seen in ED 1 month ago for the same and was given 2 mg. Ativan.  He was put on amlodipine.  He took 1 hr ago, because his BP was 170/100. C/o aching and tingling in L upper abdomen and L lower chest. When he can't get out a yawn he gets nauseated and gags and then there is another one right behind it.

## 2015-02-02 LAB — I-STAT TROPONIN, ED: Troponin i, poc: 0 ng/mL (ref 0.00–0.08)

## 2015-02-09 ENCOUNTER — Encounter: Payer: Self-pay | Admitting: Family Medicine

## 2015-02-09 ENCOUNTER — Ambulatory Visit (INDEPENDENT_AMBULATORY_CARE_PROVIDER_SITE_OTHER): Payer: No Typology Code available for payment source | Admitting: Family Medicine

## 2015-02-09 VITALS — BP 137/85 | HR 83 | Temp 97.9°F | Ht 70.0 in | Wt 183.8 lb

## 2015-02-09 DIAGNOSIS — F419 Anxiety disorder, unspecified: Principal | ICD-10-CM

## 2015-02-09 DIAGNOSIS — F32A Depression, unspecified: Secondary | ICD-10-CM

## 2015-02-09 DIAGNOSIS — F418 Other specified anxiety disorders: Secondary | ICD-10-CM

## 2015-02-09 DIAGNOSIS — F329 Major depressive disorder, single episode, unspecified: Secondary | ICD-10-CM

## 2015-02-09 DIAGNOSIS — I10 Essential (primary) hypertension: Secondary | ICD-10-CM

## 2015-02-09 NOTE — Progress Notes (Signed)
   Subjective:    Patient ID: Janie Strothman, male    DOB: June 19, 1986, 29 y.o.   MRN: [id number]  HPI 29 year old male presents for ER follow-up.  Patient is a 29 year old male with past medical history of anxiety and depression. Patient has had multiple ER visits over the past 2 months. 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 Pearson Grippe of psychiatry and Dr. Barnabas Lister of psychology, patient is awaiting sleep study to further evaluate his insomnia  Current medications include Vortioxetine which was recently increased to 15 mg daily, Xanax when necessary, Norvasc 5 mg daily for hypertension, and zolpidem and Seroquel at nighttime for sleep  Hypertension-during recent emergency room visits patient has been noted to have elevated blood pressure, he has been started on Norvasc 5 mg daily, patient has been checking his blood pressures at home which have also been elevated, no associated chest pain, no headache, no vision changes   Review of Systems See history of present illness No current chest pain, shortness of breath, or panic    Objective:   Physical Exam Vitals: Reviewed Gen.: Pleasant male, no acute  distress Cardiac: Regular rate and rhythm, S1 and S2 present, no murmurs, no heaves or thrills Respiratory: Clear to patient bilaterally, normal effort Psych: Appropriately dressed, no tangential thoughts, does not appear to be reacting to internal stimuli, no flight of ideas, affect is outgoing  Reviewed recent ED notes, chest x-rays have been unremarkable, most recent EKG showed normal sinus rhythm with right axis deviation     Assessment & Plan:  Please see problem specific assessment and plan.

## 2015-02-09 NOTE — Patient Instructions (Signed)
Your blood pressure is well controlled. Continue Norvasc daily.  Please let me know the results of your sleep study.  Follow up in one month.

## 2015-02-09 NOTE — Assessment & Plan Note (Addendum)
Patient presents to office for follow-up of multiple ER visits. Symptoms are most consistent with panic attacks. Patient is currently asymptomatic. -No changes in pharmacotherapy today as a psychiatrist recently increased SSRI -Continue Xanax when necessary panic -Follow-up with sleep study for insomnia which has been ordered by [facility] -I will contact Dr. Albertine Patricia to discuss this patient, patient has given verbal consent for me to contact Dr. Albertine Patricia

## 2015-02-27 ENCOUNTER — Encounter: Payer: Self-pay | Admitting: Family Medicine

## 2015-02-27 DIAGNOSIS — I10 Essential (primary) hypertension: Secondary | ICD-10-CM | POA: Insufficient documentation

## 2015-02-27 NOTE — Assessment & Plan Note (Signed)
Blood pressure mildly elevated (in prehypertension range) -continue Norvasc 5 mg daily -patient to check home blood pressure -adjust medications based on readings -consider home 24 hour BP monitoring

## 2015-03-09 ENCOUNTER — Ambulatory Visit (INDEPENDENT_AMBULATORY_CARE_PROVIDER_SITE_OTHER): Payer: No Typology Code available for payment source | Admitting: Family Medicine

## 2015-03-09 ENCOUNTER — Encounter: Payer: Self-pay | Admitting: Family Medicine

## 2015-03-09 VITALS — BP 134/66 | HR 89 | Temp 97.7°F | Ht 70.0 in | Wt 180.0 lb

## 2015-03-09 DIAGNOSIS — L709 Acne, unspecified: Secondary | ICD-10-CM | POA: Insufficient documentation

## 2015-03-09 DIAGNOSIS — F329 Major depressive disorder, single episode, unspecified: Secondary | ICD-10-CM

## 2015-03-09 DIAGNOSIS — I10 Essential (primary) hypertension: Secondary | ICD-10-CM | POA: Diagnosis not present

## 2015-03-09 DIAGNOSIS — L700 Acne vulgaris: Secondary | ICD-10-CM | POA: Diagnosis not present

## 2015-03-09 DIAGNOSIS — R5383 Other fatigue: Secondary | ICD-10-CM | POA: Diagnosis not present

## 2015-03-09 DIAGNOSIS — F32A Depression, unspecified: Secondary | ICD-10-CM

## 2015-03-09 MED ORDER — CLINDAMYCIN PHOSPHATE 1 % EX GEL: Freq: Two times a day (BID) | CUTANEOUS | Status: DC

## 2015-03-09 MED ORDER — BENZOYL PEROXIDE 5 % EX LIQD: Freq: Two times a day (BID) | CUTANEOUS | Status: DC

## 2015-03-09 NOTE — Patient Instructions (Addendum)
It was nice to see you.  Continue current dose of Norvasc.   Please schedule an appointment with our pharmacy team to get a 24 hour blood pressure monitor.   Return after the 24 hour blood pressure monitor.

## 2015-03-09 NOTE — Progress Notes (Signed)
   Subjective:    Patient ID: Douglas Knight, male    DOB: 19-May-1986, 29 y.o.   MRN: [id number]  HPI 29 y/o male presents for follow up of HTN.  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  Respiratory: Negative for cough and shortness of breath.   Cardiovascular: Negative for chest pain.  Gastrointestinal: Negative for nausea, vomiting and diarrhea.       Objective:   Physical Exam Vitals: reviewed Gen: pleasant male, NAD HEENT: normocephalic, PERRL, EOMI, no scleral icterus, MMM Cardiac: RRR, S1 and S2 present, no murmur Resp: CTAB, normal effort Ext: no edema Skin: multiple closed and inflamed comedones of the face and neck     Assessment & Plan:  Please see problem specific assessment and plan.

## 2015-03-09 NOTE — Assessment & Plan Note (Signed)
Multiple closed/inflamed comedones of face and neck -start Benzoyl Peroxide wash and Clindamycin gel -return in one month

## 2015-03-09 NOTE — Assessment & Plan Note (Signed)
Patient has upcoming home sleep study to evaluate insomnia.  -follow up results

## 2015-03-09 NOTE — Assessment & Plan Note (Signed)
Blood Pressures at goal today however home pressure elevated -continue Norvasc 5 mg daily -schedule for 24 hour home BP monitoring -patient to bring home BP cuff to next appointment to compare to office cuff

## 2015-03-16 ENCOUNTER — Ambulatory Visit (INDEPENDENT_AMBULATORY_CARE_PROVIDER_SITE_OTHER): Payer: No Typology Code available for payment source | Admitting: Pharmacist

## 2015-03-16 ENCOUNTER — Encounter: Payer: Self-pay | Admitting: Pharmacist

## 2015-03-16 VITALS — BP 128/78 | HR 86 | Ht 70.28 in | Wt 184.6 lb

## 2015-03-16 DIAGNOSIS — I10 Essential (primary) hypertension: Secondary | ICD-10-CM | POA: Diagnosis not present

## 2015-03-16 NOTE — Progress Notes (Signed)
S:    Patient arrives in a pleasant mood ambulating by self.    He presents to the clinic for ambulatory blood pressure evaluation.  Pt is a 29yoM studying social work at [employer] reporting home BP readings elevated in the 160-170s/100-110s.  Clinic readings have consistently been ~140/90s.  Pt may be anxious/stressed at home with school but his home BP cuff may also not be working properly- asked patient to bring it in at next visit to compare with office cuff.   Medication compliance is reported to be with amlodipine 5mg  daily in AM.  Discussed procedure for wearing the monitor and gave patient written instructions. Monitor was placed on non-dominant arm with instructions to return in the morning.   Patient returned the monitor to clinic on the afternoon of 4/20 (unable to bring the meter back on 4/19).   Meter was interrogated and results were reviewed on 4/20.   During phone conversation with patient on 4/21 he shared the following RE his BP monitoring day.    He was relaxed for several hours after monitor placement and took a nap during the morning.  He traveled to [facility] and was in class for the afternoon and evening.  He stayed at a hotel in [facility] going to sleep ~ 11:30 PM.   O:   Last 3 Office BP readings: BP Readings from Last 3 Encounters:  03/16/15 128/78  03/09/15 134/66  02/09/15 137/85  Today's Office BP reading: 128/78 mmHg (manual reading)  ABPM Study Data: Arm Placement left arm   Overall Mean 24hr BP:   145/87 mmHg HR: 91   Daytime Mean BP:  150/95 mmHg HR: 94   Nighttime Mean BP:  131/69 mmHg HR: 82    Dipping Pattern: Yes.    Sys:   12.7%   Dia: 27.4%   [normal dipping ~10-20%]   Non-hypertensive ABPM thresholds: daytime BP <135/85 mmHg, sleeptime BP <120/70 mmHg NICE Hypertension Guidelines ([place]) using ABPM: Stage I: >135/85 mmHg, Stage 2: >150/95 mmHg)   BMET    Component Value Date/Time   NA 139 02/01/2015 1932   NA 141 04/02/2014   K 3.5  02/01/2015 1932   CL 105 02/01/2015 1932   CO2 30 02/01/2015 1932   GLUCOSE 96 02/01/2015 1932   BUN 7 02/01/2015 1932   BUN 16 04/02/2014   CREATININE 1.36* 02/01/2015 1932   CREATININE 1.34 01/07/2015 0935   CREATININE 1.5* 04/02/2014   CALCIUM 8.8 02/01/2015 1932   GFRNONAA 69* 02/01/2015 1932   GFRAA 80* 02/01/2015 1932    A/P: Recently diagnosed hypertension in a 29 year old male found to have erratic blood pressure readings during the day.   After reviewing his activity log and the 24-hour ambulatory blood pressure readings it appears his blood pressure is at goal with resting or relatively low stress.   However, when he is stressed (in class) his blood pressure increases to readings > 150/100 including several readings > 170/100.   His nocturnal dipping pattern is normal.  Changes to medications were discussed however it was decided that his elevated readings may decrease as soon as the stress of his final classes at [employer] ends in a few weeks.   He was asked to monitor his readings at home, record readings that are out of range and the situation that creates the elevated readings.   Suggest a trial of a beta-blocker in the future.   Discussed with Dr. Ree Kida.    Results reviewed.  F/U Clinic Visit with Dr. Ree Kida in 2-4 weeks for Blood pressure follow up.  Total time in face-to-face counseling 35 minutes.  Patient seen with Eunice Blase, PharmD Candidate.

## 2015-03-16 NOTE — Patient Instructions (Addendum)
It was good to see you today!  Continue to take your amlodipine 5mg  daily.  Wearing the Blood Pressure Monitor  The cuff will inflate every 20 minutes during the day and every 30 minutes while you sleep.  Your blood pressure readings will NOT display after cuff inflation  Fill out the blood pressure-activity diary during the day, especially during activities that may affect your reading -- such as exercise, stress, walking, taking your blood pressure medications  Important things to know:  Avoid taking the monitor off for the next 24 hours, unless it causes you discomfort or pain.  Do NOT get the monitor wet and do NOT dry to clean the monitor with any cleaning products.  Do NOT drop the monitor.  Do NOT put the monitor on anyone else's arm.  When the cuff inflates, avoid excess movement. Let the cuffed arm hang loosely, slightly away from the body. Avoid flexing the muscles or moving the hand/fingers.  When you go to sleep, make sure that the hose is not kinked.  Remember to fill out the blood pressure activity diary.  If you experience severe pain or unusual pain (not associated with getting your blood pressure checked), remove the monitor.  Troubleshooting:  Code  Troubleshooting   1  Check cuff position, tighten cuff   2, 3  Remain still during reading   4, 87  Check air hose connections and make sure cuff is tight   85, 89  Check hose connections and make tubing is not crimped   86  Push START/STOP to restart reading   88, 91  Retry by pushing START/STOP   90  Replace batteries. If problem persists, remove monitor and bring back to   clinic at follow up   97, 98, 99  Service required - Remove monitor and bring back to clinic at follow up    Blood Pressure Activity Diary  Time Lying down/ Sleeping Walking/ Exercise Stressed/ Angry Headache/ Pain Dizzy  9 AM       10 AM       11 AM       12 PM       1 PM       2 PM       Time Lying down/ Sleeping Walking/  Exercise Stressed/ Angry Headache/ Pain Dizzy  3 PM       4 PM        5 PM       6 PM       7 PM       8 PM       Time Lying down/ Sleeping Walking/ Exercise Stressed/ Angry Headache/ Pain Dizzy  9 PM       10 PM       11 PM       12 AM       1 AM       2 AM       3 AM       Time Lying down/ Sleeping Walking/ Exercise Stressed/ Angry Headache/ Pain Dizzy  4 AM       5 AM       6 AM       7 AM       8 AM       9 AM       10 AM        Time you woke up: _________  Time you went to sleep:__________    Come back Wednesday morning to have the monitor removed  Call the [facility] if you have any questions before then [phone])   Follow up Plan - See DR. Fletke in 2-4 weeks with any elevated blood pressure readings from home monitor.

## 2015-03-16 NOTE — Progress Notes (Deleted)
Wearing the Blood Pressure Monitor  The cuff will inflate every 20 minutes during the day and every 30 minutes while you sleep.  Your blood pressure readings will NOT display after cuff inflation  Fill out the blood pressure-activity diary during the day, especially during activities that may affect your reading -- such as exercise, stress, walking, taking your blood pressure medications  Important things to know:  Avoid taking the monitor off for the next 24 hours, unless it causes you discomfort or pain.  Do NOT get the monitor wet and do NOT dry to clean the monitor with any cleaning products.  Do NOT drop the monitor.  Do NOT put the monitor on anyone else's arm.  When the cuff inflates, avoid excess movement. Let the cuffed arm hang loosely, slightly away from the body. Avoid flexing the muscles or moving the hand/fingers.  When you go to sleep, make sure that the hose is not kinked.  Remember to fill out the blood pressure activity diary.  If you experience severe pain or unusual pain (not associated with getting your blood pressure checked), remove the monitor.  Troubleshooting:  Code  Troubleshooting   1  Check cuff position, tighten cuff   2, 3  Remain still during reading   4, 87  Check air hose connections and make sure cuff is tight   85, 89  Check hose connections and make tubing is not crimped   86  Push START/STOP to restart reading   88, 91  Retry by pushing START/STOP   90  Replace batteries. If problem persists, remove monitor and bring back to   clinic at follow up   97, 98, 99  Service required - Remove monitor and bring back to clinic at follow up    Blood Pressure Activity Diary  Time Lying down/ Sleeping Walking/ Exercise Stressed/ Angry Headache/ Pain Dizzy  9 AM       10 AM       11 AM       12 PM       1 PM       2 PM       Time Lying down/ Sleeping Walking/ Exercise Stressed/ Angry Headache/ Pain Dizzy  3 PM       4 PM        5 PM       6  PM       7 PM       8 PM       Time Lying down/ Sleeping Walking/ Exercise Stressed/ Angry Headache/ Pain Dizzy  9 PM       10 PM       11 PM       12 AM       1 AM       2 AM       3 AM       Time Lying down/ Sleeping Walking/ Exercise Stressed/ Angry Headache/ Pain Dizzy  4 AM       5 AM       6 AM       7 AM       8 AM       9 AM       10 AM        Time you woke up: _________                  Time you went to sleep:__________  Come back tomorrow at ___________ to have the monitor removed  Call the [facility] if you have any questions before then [phone])

## 2015-03-18 ENCOUNTER — Telehealth: Payer: Self-pay | Admitting: Pharmacist

## 2015-03-18 NOTE — Telephone Encounter (Signed)
Did NOT return to meter, called requested return of meter.   He immediately called back and stated he would be returning to the office within the next hour.   He was told he will likely need to just drop off meter and have [place] return call with results.

## 2015-03-19 ENCOUNTER — Telehealth: Payer: Self-pay | Admitting: Pharmacist

## 2015-03-19 NOTE — Telephone Encounter (Signed)
Attempted to call to follow up with Ambulatory Blood Pressure Monitor - results.  Left message requesting call back to discuss results.

## 2015-03-19 NOTE — Progress Notes (Signed)
Patient ID: Douglas Knight, male   DOB: 07-Sep-1986, 29 y.o.   MRN: [id number] Reviewed: Agree with Dr. Graylin Shiver documentation and management.

## 2015-03-19 NOTE — Assessment & Plan Note (Signed)
Recently diagnosed hypertension in a 29 year old male found to have erratic blood pressure readings during the day.   After reviewing his activity log and the 24-hour ambulatory blood pressure readings it appears his blood pressure is at goal with resting or relatively low stress.   However, when he is stressed (in class) his blood pressure increases to readings > 150/100 including several readings > 170/100.   His nocturnal dipping pattern is normal.  Changes to medications were discussed however it was decided that his elevated readings may decrease as soon as the stress of his final classes at [employer] ends in a few weeks.   He was asked to monitor his readings at home, record readings that are out of range and the situation that creates the elevated readings.   Suggest a trial of a beta-blocker in the future.

## 2015-06-02 ENCOUNTER — Ambulatory Visit: Payer: No Typology Code available for payment source | Admitting: Family Medicine

## 2015-07-27 ENCOUNTER — Encounter: Payer: Self-pay | Admitting: Family Medicine

## 2015-07-27 ENCOUNTER — Ambulatory Visit (INDEPENDENT_AMBULATORY_CARE_PROVIDER_SITE_OTHER): Payer: No Typology Code available for payment source | Admitting: Family Medicine

## 2015-07-27 VITALS — BP 122/76 | HR 72 | Temp 98.0°F | Ht 70.0 in | Wt 182.0 lb

## 2015-07-27 DIAGNOSIS — G4700 Insomnia, unspecified: Secondary | ICD-10-CM | POA: Diagnosis not present

## 2015-07-27 DIAGNOSIS — F329 Major depressive disorder, single episode, unspecified: Secondary | ICD-10-CM

## 2015-07-27 DIAGNOSIS — R5383 Other fatigue: Secondary | ICD-10-CM

## 2015-07-27 DIAGNOSIS — I10 Essential (primary) hypertension: Secondary | ICD-10-CM | POA: Diagnosis not present

## 2015-07-27 MED ORDER — ESZOPICLONE 1 MG PO TABS: 1.0000 mg | ORAL_TABLET | Freq: Every evening | ORAL | Status: DC | PRN

## 2015-07-27 NOTE — Progress Notes (Signed)
   Subjective:    Patient ID: Douglas Knight, male    DOB: May 12, 1986, 29 y.o.   MRN: [id number]  HPI 29 year old male presents for follow-up of multiple medical issues.  Fatigue- patient continues to have daily fatigue, this is significantly impacting his work as he has trouble with concentration, she reports associated insomnia, he has prolonged sleep latency, patient did recently undergo sleep study which identified mild sleep apnea, he has been intolerant to the CPAP machine  Insomnia - patient reports racing thoughts, difficulty falling asleep, it affects his ability to wake up in the morning, currently taking Ambien 10 mg nightly which has provided minimal relief, has attempted trazodone in the past which provided minimal relief of his symptoms, he has also tried Restoril which provided minimal relief  Anxiety/depressed mood - patient follows with Dr. Raeanne Barry and Dr. 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  Respiratory: Negative for shortness of breath.   Cardiovascular: Negative for chest pain.  Gastrointestinal: Negative for nausea, vomiting and diarrhea.       Objective:   Physical Exam Vitals: Reviewed Gen.: Pleasant male, no acute distress Cardiac: Regular rate and rhythm, S1 and S2 present, no  murmurs, no heaves or thrills Respiratory: Clear to station bilaterally, normal effort Psych: Appropriate dressed, affect is outgoing and slightly anxious, mood is described as depressed, no genital thoughts, no flight of ideas, does not appear to be reacting to internal stimuli       Assessment & Plan:  Please see problem specific assessment and plan.

## 2015-07-27 NOTE — Assessment & Plan Note (Signed)
Patient continues to have insomnia with prolonged sleep latency -Attempt trial of Lunesta 1 mg nightly

## 2015-07-27 NOTE — Patient Instructions (Addendum)
It was nice to see you today.  I will check with Dr. Albertine Patricia to see if Douglas Knight is an option for you.  Stop ambien, start Lunesta.

## 2015-07-27 NOTE — Assessment & Plan Note (Signed)
Controlled on amlodipine 5 mg daily -Continue current therapy

## 2015-07-27 NOTE — Assessment & Plan Note (Addendum)
Patient continues to have daily fatigue which is likely multifactorial from insomnia, mild OSA, and chronic depression/anxiety. -Attempt trial of Lunesta 1 mg nightly, discontinue Ambien -No changes to current psychiatric medications -Encouraged continued follow-up with psychologist and psychiatrist -Continue to exercise 4-5 times per week -Will discuss starting Strattera with Dr. Albertine Patricia to help with decreased focus

## 2015-09-24 ENCOUNTER — Ambulatory Visit: Payer: No Typology Code available for payment source | Admitting: Family Medicine

## 2015-11-16 ENCOUNTER — Ambulatory Visit (INDEPENDENT_AMBULATORY_CARE_PROVIDER_SITE_OTHER): Payer: BLUE CROSS/BLUE SHIELD | Admitting: Family Medicine

## 2015-11-16 ENCOUNTER — Encounter: Payer: Self-pay | Admitting: Family Medicine

## 2015-11-16 VITALS — BP 148/84 | HR 81 | Temp 98.0°F | Ht 70.0 in | Wt 167.0 lb

## 2015-11-16 DIAGNOSIS — F418 Other specified anxiety disorders: Secondary | ICD-10-CM

## 2015-11-16 DIAGNOSIS — F419 Anxiety disorder, unspecified: Principal | ICD-10-CM

## 2015-11-16 DIAGNOSIS — R5383 Other fatigue: Secondary | ICD-10-CM

## 2015-11-16 DIAGNOSIS — F329 Major depressive disorder, single episode, unspecified: Secondary | ICD-10-CM

## 2015-11-16 DIAGNOSIS — F32A Depression, unspecified: Secondary | ICD-10-CM

## 2015-11-16 LAB — HIV ANTIBODY (ROUTINE TESTING W REFLEX): HIV 1&2 Ab, 4th Generation: NONREACTIVE

## 2015-11-16 MED ORDER — HYDROXYZINE HCL 50 MG PO TABS: 50.0000 mg | ORAL_TABLET | Freq: Four times a day (QID) | ORAL | Status: DC | PRN

## 2015-11-16 NOTE — Patient Instructions (Signed)
Take hydroxyzine as needed for anxiety. Will likely make you sleepy. Please contact Dr. Gwenlyn Saran, clinical psychologist, to set up an appointment in  [place] [facility].  She can be reached at [phone].    Checking labwork today: vitamin D, HIV Follow up with Dr. Ree Kida in 2-3 weeks  Be well, Dr. Ardelia Mems

## 2015-11-16 NOTE — Progress Notes (Signed)
Date of Visit: 11/16/2015   HPI:  Patient presents for follow up of his depression and anxiety. Previously followed by psychiatry Dr. Tomasa Blase but patient reports no longer wanting to see that provider. 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 Wants to get in with a new psychiatrist. Tapered off of xanax recently, last took about 2.5 weeks ago. Reports feeling withdrawal symptoms from not being on xanax - headache, body aches, nausea, poor sleep, increased anxiety, fatigue. Also notes appetite is low. He attributes all these to uncontrolled mood issues. Currently taking risperdal 0.5mg  daily and lunesta 3mg  daily. Denies SI/HI. Patient adamant that he does not want to go back on a benzodiazepine due to how badly he felt coming off of xanax.  ROS: See HPI.  [place]: history of hypertension, fatigue, acne, anxiety/dep, chronic ITP  PHYSICAL EXAM: BP 148/84 mmHg  Pulse 81  Temp(Src) 98 F (36.7 C) (Oral)  Ht 5\' 10"  (1.778 m)  Wt 167 lb (75.751 kg)  BMI 23.96 kg/m2 Gen: NAD, pleasant, cooperative HEENT: NCAT Heart: regular rate and rhythm no murmur Lungs: clear to auscultation bilaterally normal work of breathing  Neuro: alert grossly nonfocal, speech normal Ext: atraumatic Psych: normal range of affect, well groomed, speech normal in rate and volume, normal eye contact   ASSESSMENT/PLAN:  Anxiety and depression Uncontrolled at present. PHQ-9 score 12, "very difficult". Denies SI/HI. Discussed recommendation that patient be seen by new psychiatrist. While he is agreeable, unfortunately his options are limited. He is willing to see Drs. Gwenlyn Saran and [name] in mood disorder clinic here. Will give him Dr. Tod Persia phone number so that he can schedule appointment. In the interim, rx hydroxyzine as anxiolytic, will also help with sleep.   Fatigue Prior  labwork reviewed - normal TSH, CBC, BMET. Patient endorsing mild weight loss, decreased appetite, and fatigue. Likely these are related to his psychiatric conditions, but will do further eval to rule out organic process: check vit D & HIV today.   FOLLOW UP: F/u in  for 2-3 weeks with PCP for above issues Schedule appointment in [facility].   [place]. Ardelia Mems, [place]

## 2015-11-17 ENCOUNTER — Telehealth: Payer: Self-pay | Admitting: Family Medicine

## 2015-11-17 ENCOUNTER — Telehealth: Payer: Self-pay | Admitting: Psychology

## 2015-11-17 ENCOUNTER — Encounter: Payer: Self-pay | Admitting: Family Medicine

## 2015-11-17 DIAGNOSIS — E559 Vitamin D deficiency, unspecified: Secondary | ICD-10-CM | POA: Insufficient documentation

## 2015-11-17 LAB — VITAMIN D 25 HYDROXY (VIT D DEFICIENCY, FRACTURES): Vit D, 25-Hydroxy: 18 ng/mL — ABNORMAL LOW (ref 30–100)

## 2015-11-17 MED ORDER — CHOLECALCIFEROL 1.25 MG (50000 UT) PO TABS: 1.0000 | ORAL_TABLET | ORAL | Status: DC

## 2015-11-17 NOTE — Telephone Encounter (Signed)
Patient left a VM requesting an appointment for [facility].  Reviewed chart.  Looks like he has seen Pearson Grippe (psychiatrist) in the past and Lissa Morales (psychologist.  Called patient back to discuss.  Left a VM.

## 2015-11-17 NOTE — Assessment & Plan Note (Addendum)
Prior labwork reviewed - normal TSH, CBC, BMET. Patient endorsing mild weight loss, decreased appetite, and fatigue. Likely these are related to his psychiatric conditions, but will do further eval to rule out organic process: check vit D & HIV today.

## 2015-11-17 NOTE — Telephone Encounter (Signed)
Mr. Douglas Knight called back.  We discussed his situation briefly.  He recently came off of Xanax and would like to stay off of it.  Dr. Ardelia Mems prescribed him hydroxyzine for anxiety.  He is no longer seeing Dr. Albertine Patricia (things did not work out) and is hesitant to see too many people in the community because he knows them professionally (he is a Education officer, museum at [employer]).  Explained the nature of our clinic and what to expect.  First available is February 1st at 9:00.  He agreed.  I told him the following: -  If he is unable to make the appointment he needs to call me. -  If he misses the appointment without a phone call, I won't be able to schedule him back in my clinic. He voiced an understanding and was able to repeat the appointment date and time back to me.

## 2015-11-17 NOTE — Telephone Encounter (Signed)
Attempted to reach patient to discuss low vit D level. No answer, left VM asking him to return call to [place]. -When he returns call, please advise that his vit D level was low and that I recommend he take a weekly vitamin D supplement for 8 weeks, at which point his level will need to be rechecked. I have sent this in to his pharmacy. -Please also tell him his HIV test was NEGATIVE.  Thanks, Leeanne Rio, MD

## 2015-11-17 NOTE — Telephone Encounter (Signed)
Message delivered

## 2015-11-17 NOTE — Assessment & Plan Note (Signed)
Uncontrolled at present. PHQ-9 score 12, "very difficult". Denies SI/HI. Discussed recommendation that patient be seen by new psychiatrist. While he is agreeable, unfortunately his options are limited. He is willing to see Drs. Gwenlyn Saran and [name] in mood disorder clinic here. Will give him Dr. Tod Persia phone number so that he can schedule appointment. In the interim, rx hydroxyzine as anxiolytic, will also help with sleep.

## 2015-12-01 NOTE — Telephone Encounter (Signed)
Red team, can you call this patient and deliver message below? I wasn't able to reach him before and it appears he has not called back.  Thanks, Leeanne Rio, MD

## 2015-12-02 NOTE — Telephone Encounter (Signed)
Left message on voicemail for patient to return call. 

## 2015-12-02 NOTE — Telephone Encounter (Signed)
Patient informed, expressed understanding. 

## 2015-12-04 ENCOUNTER — Ambulatory Visit (INDEPENDENT_AMBULATORY_CARE_PROVIDER_SITE_OTHER): Payer: BLUE CROSS/BLUE SHIELD | Admitting: Family Medicine

## 2015-12-04 ENCOUNTER — Encounter: Payer: Self-pay | Admitting: Family Medicine

## 2015-12-04 ENCOUNTER — Telehealth: Payer: Self-pay | Admitting: *Deleted

## 2015-12-04 VITALS — BP 147/82 | HR 79 | Temp 98.4°F | Ht 70.0 in | Wt 166.9 lb

## 2015-12-04 DIAGNOSIS — F329 Major depressive disorder, single episode, unspecified: Secondary | ICD-10-CM

## 2015-12-04 DIAGNOSIS — F418 Other specified anxiety disorders: Secondary | ICD-10-CM

## 2015-12-04 DIAGNOSIS — L700 Acne vulgaris: Secondary | ICD-10-CM | POA: Diagnosis not present

## 2015-12-04 DIAGNOSIS — F419 Anxiety disorder, unspecified: Secondary | ICD-10-CM

## 2015-12-04 DIAGNOSIS — G4700 Insomnia, unspecified: Secondary | ICD-10-CM

## 2015-12-04 DIAGNOSIS — F32A Depression, unspecified: Secondary | ICD-10-CM

## 2015-12-04 MED ORDER — BUSPIRONE HCL 7.5 MG PO TABS: 7.5000 mg | ORAL_TABLET | Freq: Two times a day (BID) | ORAL | Status: DC

## 2015-12-04 MED ORDER — RISPERIDONE 0.5 MG PO TABS: 0.5000 mg | ORAL_TABLET | Freq: Every day | ORAL | Status: DC

## 2015-12-04 MED ORDER — ESZOPICLONE 1 MG PO TABS: 3.0000 mg | ORAL_TABLET | Freq: Every evening | ORAL | Status: DC | PRN

## 2015-12-04 MED ORDER — LORAZEPAM 0.5 MG PO TABS: 0.5000 mg | ORAL_TABLET | Freq: Two times a day (BID) | ORAL | Status: DC | PRN

## 2015-12-04 NOTE — Assessment & Plan Note (Signed)
Patient reports stable acne with occasional flareups. -Continue current therapy with benzoyl peroxide and when necessary clindamycin

## 2015-12-04 NOTE — Assessment & Plan Note (Signed)
Uncontrolled with Lunesta. Patient has failed multiple other sleep therapies. Symptoms secondary to severe anxiety. -We'll continue current dose of Lunesta -CBT therapy to be initiated when he sees Dr. Gwenlyn Saran in February 2017

## 2015-12-04 NOTE — Progress Notes (Signed)
   Subjective:    Patient ID: Douglas Knight, male    DOB: [birth year], 30 y.o.   MRN: [id number]  HPI 30 year old male presents for follow-up of anxiety and depression.  Anxiety and depression - patient recently seen on 11/16/2015 by Dr. Ardelia Mems at [facility], started on hydroxyzine when necessary anxiety, patient reports no relief with this medication, he continues to take risperidone 0.5 mg daily at bedtime, this has previously been prescribed by his psychiatrist Dr. Albertine Patricia, the patient is no longer seeing this physician as he felt that they did not have a therapeutic relationship, he has upcoming appointment with Dr. 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, museum, he is considering restarting a benzodiazepine however is hesitant due to the addictive quality of these medications  Insomnia - patient reports continued difficulty with sleep, she has difficulty with both falling asleep and has multiple nighttime awakenings, is currently taking Lunesta 3 mg nightly  Acne - patient reports intermittent flares however symptoms are significantly improved with benzoyl peroxide wash and as needed clindamycin gel  Social - currently has a girlfriend who works as a Theme park manager, works as a Education officer, museum   Review of Systems  Constitutional: Negative for fever, chills and fatigue.  Respiratory: Negative for shortness of breath.   Cardiovascular: Negative for chest pain.  Gastrointestinal: Negative for nausea, vomiting and diarrhea.       Objective:   Physical Exam Vitals: Reviewed Gen.: Pleasant male, mildly anxious, no acute distress Psych: Well dressed, behavior is appropriate, affect is outgoing, mood is depressed, no tangential thoughts, no hallucinations, does not appear to be responding to  internal stimuli, no homicidal or suicidal thoughts Skin - multiple inflammatory papules of the face     Assessment & Plan:  Anxiety and depression Patient continues to have uncontrolled symptoms of anxiety and depression. -Will initiate trial of BuSpar 7.5 mg twice a day, short course of Ativan included until full effect of BuSpar is noted -Refill of risperidone provided today -Patient to follow-up with Dr. Sharyn Lull cane in February 2017  Acne Patient reports stable acne with occasional flareups. -Continue current therapy with benzoyl peroxide and when necessary clindamycin  Insomnia Uncontrolled with Lunesta. Patient has failed multiple other sleep therapies. Symptoms secondary to severe anxiety. -We'll continue current dose of Lunesta -CBT therapy to be initiated when he sees Dr. Gwenlyn Saran in February 2017

## 2015-12-04 NOTE — Telephone Encounter (Signed)
Douglas Knight from [facility] called requesting Lunesta 1 mg tablet to the 3 mg tablet.  Rx was written for eszopiclone (LUNESTA) 1 MG TABS tablet:Take 3 tablets (3 mg total) by mouth at bedtime as needed for sleep. Take immediately before bedtime.  Patient has been taking 3 mg tablet once at bed time as needed. Verbal order given by Dr. Nori Riis to change to Lunesta 3 mg tablet once at bedtime as needed.  Derl Barrow, RN

## 2015-12-04 NOTE — Patient Instructions (Signed)
It was nice to see you today.  Please start Buspar twice daily. Take Ativan prn anxiety.   Please return for follow up in one month.

## 2015-12-04 NOTE — Assessment & Plan Note (Signed)
Patient continues to have uncontrolled symptoms of anxiety and depression. -Will initiate trial of BuSpar 7.5 mg twice a day, short course of Ativan included until full effect of BuSpar is noted -Refill of risperidone provided today -Patient to follow-up with Dr. Sharyn Lull cane in February 2017

## 2015-12-22 ENCOUNTER — Telehealth: Payer: Self-pay | Admitting: *Deleted

## 2015-12-22 ENCOUNTER — Ambulatory Visit (INDEPENDENT_AMBULATORY_CARE_PROVIDER_SITE_OTHER): Payer: BLUE CROSS/BLUE SHIELD | Admitting: Family Medicine

## 2015-12-22 ENCOUNTER — Encounter: Payer: Self-pay | Admitting: Family Medicine

## 2015-12-22 VITALS — BP 135/88 | HR 96 | Temp 98.0°F | Ht 70.0 in | Wt 173.0 lb

## 2015-12-22 DIAGNOSIS — G4452 New daily persistent headache (NDPH): Secondary | ICD-10-CM | POA: Diagnosis not present

## 2015-12-22 DIAGNOSIS — M62838 Other muscle spasm: Secondary | ICD-10-CM

## 2015-12-22 DIAGNOSIS — R42 Dizziness and giddiness: Secondary | ICD-10-CM

## 2015-12-22 DIAGNOSIS — M6248 Contracture of muscle, other site: Secondary | ICD-10-CM | POA: Diagnosis not present

## 2015-12-22 LAB — BASIC METABOLIC PANEL
BUN: 16 mg/dL (ref 7–25)
CALCIUM: 9.2 mg/dL (ref 8.6–10.3)
CO2: 27 mmol/L (ref 20–31)
CREATININE: 1.1 mg/dL (ref 0.60–1.35)
Chloride: 103 mmol/L (ref 98–110)
GLUCOSE: 94 mg/dL (ref 65–99)
Potassium: 4 mmol/L (ref 3.5–5.3)
Sodium: 139 mmol/L (ref 135–146)

## 2015-12-22 LAB — CBC
HEMATOCRIT: 42.2 % (ref 39.0–52.0)
Hemoglobin: 15.1 g/dL (ref 13.0–17.0)
MCH: 32.3 pg (ref 26.0–34.0)
MCHC: 35.8 g/dL (ref 30.0–36.0)
MCV: 90.4 fL (ref 78.0–100.0)
MPV: 8.4 fL — ABNORMAL LOW (ref 8.6–12.4)
Platelets: 119 10*3/uL — ABNORMAL LOW (ref 150–400)
RBC: 4.67 MIL/uL (ref 4.22–5.81)
RDW: 13.1 % (ref 11.5–15.5)
WBC: 6.3 10*3/uL (ref 4.0–10.5)

## 2015-12-22 LAB — POCT SEDIMENTATION RATE: POCT SED RATE: 2 mm/hr (ref 0–22)

## 2015-12-22 MED ORDER — CYCLOBENZAPRINE HCL 10 MG PO TABS: 10.0000 mg | ORAL_TABLET | Freq: Three times a day (TID) | ORAL | Status: DC | PRN

## 2015-12-22 NOTE — Progress Notes (Signed)
Date of Visit: 12/22/2015   HPI:  Patient presents to discuss vertigo, ongoing for the last 8 days. Was seen at Urgent Care ([facility] [address]) twice for this. First time was given rx for meclizine, which didn't help. Seen again and was given rx for prednisone and doxycycline out of concern for possible tick bite. Is still on doxycycline. Took one day of prednisone but didn't like how it made him feel so he stopped it. Initially had stomach bug about 2 weeks ago with vomiting and diarrhea. That resolved. Took phenergan during that illness.   Denies any head injuries. Does not take blood thinners. Has mild ringing in his ears but no decreased hearing. He denies having any fevers. Has had posterior neck pain and stiffness. Has had constant headache since the dizziness started, mostly in the back of his head and neck. No history of migraines. While the headache is constant, the dizziness comes and goes several times per day. Headache has awoken him from sleep. Dizziness is not made worse with position changes. Bright lights make the headache worse. Tylenol and advil have helped dull the pain. No trouble speaking. His coworkers saw mild tremors yesterday but these have since resolved.   ROS: See HPI.  [place]: history of acne, anxiety/depression, chronic ITP, hypertension, chronic fatigue and insomnia, vit D deficiency  PHYSICAL EXAM: BP 135/88 mmHg  Pulse 96  Temp(Src) 98 F (36.7 C) (Oral)  Ht 5\' 10"  (1.778 m)  Wt 173 lb (78.472 kg)  BMI 24.82 kg/m2 Gen: NAD, pleasant, cooperative, alert HEENT: normocephalic, atraumatic. Posterior neck musculature tender to palpation and consistent with spasm. Diminished passive and active ROM secondary to posterior neck musculature pain. Mouth moist. tympanic membranes with mild effusion but no erythema or severe bulging. Ophthalmoscopic exam attempted but difficult due to lack of pharmacologic dilatation. Heart: regular rate and rhythm, no  murmur Lungs: clear to auscultation bilaterally, normal work of breathing  Neuro: alert and oriented. Speech normal. pupils equal round and reactive to light. extraocular movements intact without nystagmus. Hearing grossly equal bilaterally. Face symmetric. Facial muscles intact. Sensation intact to light touch over bilateral face. Shoulder shrug and head turn 5/5 bilaterally. Tongue protrudes midline. Uvula midline. Sensation intact to light touch over upper and lower extremities bilaterally. Patellar reflexes diminished bilaterally but equal. Finger nose finger testing normal bilaterally. Unable to perform head impulse test due to neck pain associated with rapid movement of head. Ext: No appreciable lower extremity edema bilaterally   ASSESSMENT/PLAN:  30 yo M with history of anxiety, depression, chronic fatigue presents with 8 days of vertigo, continuous new headache, and neck pain. Differential diagnosis is broad and includes benign etiologies such as BPPV or complex migraine, along with more severe conditions such as intracranial mass lesion, posterior cerebral or cerebellar stroke, intracranial bleed, meningitis, vestibular tumor. While patient does have notable neck stiffness on exam, I have low suspicion for meningitis at this time given lack of subjective or objective fevers. Favor neck muscle spasm given tenderness directly over neck musculature. Neurological exam is reassuringly normal. With new headache that awakes patient in the morning and associated with vertiginous symptoms, warrants further evaluation with labwork and imaging.  Plan: - labwork today: CBC with differential, BMET, sed rate - MRI brain with and without contrast. Advised can take up to two of his ativan 0.5mg  pill prior to MRI, will need a driver if he does this. - trial of flexeril and heating pad for trapezius spasm - offered medication to  assist with pain, patient declines and prefers to continue to use  tylenol/ibuprofen - schedule follow up appointment here at [facility] with either myself or PCP within 1-2 weeks - discussed red flags, including neuro abnormalities or fevers which should prompt immediate evaluation - patient agreeable to this plan.  FOLLOW UP: Follow up in 1-2 weeks for above problems.  [place]. Ardelia Mems, [place]

## 2015-12-22 NOTE — Telephone Encounter (Signed)
Contacted scheduling for this and the med center [facility]) is going to contact pt to set the time and allow pt to answer questions.  Forwarding to referral coordinator as an Juluis Rainier, still needs authorization though. Katharina Caper, April D, [place]

## 2015-12-22 NOTE — Patient Instructions (Signed)
Checking labwork and MRI scan. Follow up in 1-2 weeks to review results and see how you're doing.  Be well, Dr. Ardelia Mems   Vertigo Vertigo means you feel like you or your surroundings are moving when they are not. Vertigo can be dangerous if it occurs when you are at work, driving, or performing difficult activities.  CAUSES  Vertigo occurs when there is a conflict of signals sent to your brain from the visual and sensory systems in your body. There are many different causes of vertigo, including:  Infections, especially in the inner ear.  A bad reaction to a drug or misuse of alcohol and medicines.  Withdrawal from drugs or alcohol.  Rapidly changing positions, such as lying down or rolling over in bed.  A migraine headache.  Decreased blood flow to the brain.  Increased pressure in the brain from a head injury, infection, tumor, or bleeding. SYMPTOMS  You may feel as though the world is spinning around or you are falling to the ground. Because your balance is upset, vertigo can cause nausea and vomiting. You may have involuntary eye movements (nystagmus). DIAGNOSIS  Vertigo is usually diagnosed by physical exam. If the cause of your vertigo is unknown, your caregiver may perform imaging tests, such as an MRI scan (magnetic resonance imaging). TREATMENT  Most cases of vertigo resolve on their own, without treatment. Depending on the cause, your caregiver may prescribe certain medicines. If your vertigo is related to body position issues, your caregiver may recommend movements or procedures to correct the problem. In rare cases, if your vertigo is caused by certain inner ear problems, you may need surgery. HOME CARE INSTRUCTIONS   Follow your caregiver's instructions.  Avoid driving.  Avoid operating heavy machinery.  Avoid performing any tasks that would be dangerous to you or others during a vertigo episode.  Tell your caregiver if you notice that certain medicines seem to  be causing your vertigo. Some of the medicines used to treat vertigo episodes can actually make them worse in some people. SEEK IMMEDIATE MEDICAL CARE IF:   Your medicines do not relieve your vertigo or are making it worse.  You develop problems with talking, walking, weakness, or using your arms, hands, or legs.  You develop severe headaches.  Your nausea or vomiting continues or gets worse.  You develop visual changes.  A family member notices behavioral changes.  Your condition gets worse. MAKE SURE YOU:  Understand these instructions.  Will watch your condition.  Will get help right away if you are not doing well or get worse.   This information is not intended to replace advice given to you by your health care provider. Make sure you discuss any questions you have with your health care provider.   Document Released: 08/24/2005 Document Revised: 02/06/2012 Document Reviewed: 03/09/2015 Elsevier Interactive Patient Education [employer].

## 2015-12-23 DIAGNOSIS — R42 Dizziness and giddiness: Secondary | ICD-10-CM | POA: Insufficient documentation

## 2015-12-23 NOTE — Telephone Encounter (Signed)
Done

## 2015-12-24 ENCOUNTER — Encounter: Payer: Self-pay | Admitting: Family Medicine

## 2015-12-25 ENCOUNTER — Telehealth: Payer: Self-pay | Admitting: Family Medicine

## 2015-12-25 NOTE — Telephone Encounter (Signed)
Would like test results from tuesday

## 2015-12-25 NOTE — Telephone Encounter (Signed)
Returned call to patient and explained unremarkable labs except for low platelets (chronic, stable) Advised patient he should be getting letter in the mail with lab copy as I routed one yesterday  Leeanne Rio, MD

## 2015-12-26 ENCOUNTER — Ambulatory Visit (HOSPITAL_BASED_OUTPATIENT_CLINIC_OR_DEPARTMENT_OTHER): Payer: BLUE CROSS/BLUE SHIELD

## 2015-12-30 ENCOUNTER — Ambulatory Visit (INDEPENDENT_AMBULATORY_CARE_PROVIDER_SITE_OTHER): Payer: BLUE CROSS/BLUE SHIELD | Admitting: Psychology

## 2015-12-30 DIAGNOSIS — F331 Major depressive disorder, recurrent, moderate: Secondary | ICD-10-CM | POA: Diagnosis not present

## 2015-12-30 NOTE — Patient Instructions (Signed)
Please schedule a follow-up for:  February 15th at 8:30.   Dr. [name] recommended increasing your Risperdal to 1 mg at night (increased from 0.5 mg).  We are hoping this helps your racing thoughts and gets you better sleep. Going to dinner at your parents on Friday seems like a good thing to do. You agreed to watch the Ted Talk by Almond Lint on Shame and Vulnerability.  You can google this.

## 2015-12-30 NOTE — Assessment & Plan Note (Signed)
Douglas Knight is neatly groomed and appropriately dressed for work.  He maintains good eye contact and is cooperative and attentive.  Speech is normal in tone, rate and rhythm.  Mood is depressed with a mildly anxious affect.  Thought process is logical and goal directed.  Denied suicidal or homicidal ideation.  Does not appear to be responding to any internal stimuli.  Able to maintain train of thought and concentrate on the questions.  Judgment and insight are average to above average.  Treatment team thinks that he was "faking good" and that he is doing not as well as he presents.  Given his long list of medications that he has tried (and failed) and his report of his experience with a previous psychiatrist, wanted to be cautious in our approach.  Therapy is likely the most important component of treatment.  He is interested in medication however.  Dr. [name] thought it reasonable to increase the Risperdal.  It has been helpful per patient report and is at a low dose.  Hopefully it will help with sleep and decreasing his anxious thoughts at bed time.  Only wanted to make one change at a time.  See patient instructions for further plan.

## 2015-12-30 NOTE — Progress Notes (Signed)
Presenting Issue: Depression and anxiety.  Reports psychosomatic symptoms manifest when his anxiety worsens.  Report of symptoms: Vertigo and other somatic symptoms - most prominent is a need, but inability, to yawn.  Can lead to a panic attack.  Anxiety symptoms can be panic like (lasting sometimes for 15-30 minutes and one time for days).  Also reports depressive symptoms including anhedonia, depressed mood, lack of energy, sleep difficulty, feelings of worthlessness, difficulty concentrating, and (currently) fleeting suicidal ideation.  Duration of CURRENT symptoms:  End of December to now. Age of onset of first mood disturbance:  2005 when he was 30 years old.  Graduated from his K-12 school where he fit in and went off to college where he did not fit in.  Started out as an "adjustment disorder" per his report.     Impact on function: Significant.  Missed 7 days of work this month already.  Having trouble focusing at work, getting notes done.  Isolating.  Hasn't done anything outside of work and home since December.  Keeps up with laundry and other household tasks.    Psychiatric History - Diagnoses:  GAD and Depression - Hospitalizations:  Behavioral Health in [facility] in 2007 secondary to a suicide attempt (overdose) - Pharmacotherapy: Saw Dr. Tressa Busman until he retired.  Saw Dr. Albertine Patricia but did not think this was a good match. Zoloft   50 mg worsening depression Brintellix  10 mg Panic state Remeron 45 mg Sleep good; gained too much weight Abilify/Celexa  No benefit Cymbalta 60 mg Started during hospitalization; remained stable for about 8 months; went to [place] and did well without anything;  Restarted in 2014 and did not notice benefit Lexapro  No worse; no better Ambien  Short-term it was helpful; decreased in efficacy over time Wellbutrin       150 mg Panic state Nortriptyline    100 mg No benefit Xanax     1 mg Three times a day.  Had withdrawal symptoms when stopped. Klonopin 0.5  mg No benefit Buspar 7.5 mg Twice a day Seroquel        300 mg For sleep; was not helpful and did not notice an effect on mood  Currently takes: Risperdal 0.5 mg Calms mind; helps some with sleep Lunesta    3 mg  Ativan  0.5 mg Twice a day  - Outpatient therapy: Was seeing Lissa Morales but became too expensive.  Has better insurance and saw Dr. Almon Register Monday.  Will follow on Friday.  Thinks it is a reasonable connection  Family history of psychiatric issues:  Parents are married.  No noted mental health issues in them or his two siblings.  Paternal grandfather may have suffered a major depressive episode.  No significant details.  Parents are supportive.  He has trouble being honest / authentic with them for fear of disappointing them.  Current and history of substance use: Denied current or history of significant substance use.  Other: Works as a Mining engineer at [employer].  Has Masters in Social Work.  Owns his own home.  Has significant debt to pay off.  Was involved in [employer] until he went to college.  Significant shift afterward that has been difficult to get past.

## 2016-01-02 ENCOUNTER — Ambulatory Visit (HOSPITAL_BASED_OUTPATIENT_CLINIC_OR_DEPARTMENT_OTHER): Payer: BLUE CROSS/BLUE SHIELD

## 2016-01-04 ENCOUNTER — Telehealth: Payer: Self-pay | Admitting: Family Medicine

## 2016-01-04 NOTE — Telephone Encounter (Signed)
Attempted to contact patient. Left message stating that [facility] has been attempting to contact him for an imaging study. Provided number. Also told the patient to call my office to confirm that he has received this message.

## 2016-01-04 NOTE — Telephone Encounter (Signed)
[facility] called because the patient has missed both of his appointments to have some imaging done. jw

## 2016-01-13 ENCOUNTER — Ambulatory Visit (INDEPENDENT_AMBULATORY_CARE_PROVIDER_SITE_OTHER): Payer: BLUE CROSS/BLUE SHIELD | Admitting: Psychology

## 2016-01-13 DIAGNOSIS — E559 Vitamin D deficiency, unspecified: Secondary | ICD-10-CM

## 2016-01-13 DIAGNOSIS — F331 Major depressive disorder, recurrent, moderate: Secondary | ICD-10-CM

## 2016-01-13 DIAGNOSIS — F332 Major depressive disorder, recurrent severe without psychotic features: Secondary | ICD-10-CM | POA: Diagnosis not present

## 2016-01-13 LAB — T4, FREE: Free T4: 1.3 ng/dL (ref 0.8–1.8)

## 2016-01-13 LAB — TSH: TSH: 1.37 m[IU]/L (ref 0.40–4.50)

## 2016-01-13 LAB — T3, FREE: T3 FREE: 3.7 pg/mL (ref 2.3–4.2)

## 2016-01-13 NOTE — Addendum Note (Signed)
Addended by: Lupita Dawn on: 01/13/2016 02:16 PM   Modules accepted: Orders, Medications

## 2016-01-13 NOTE — Assessment & Plan Note (Addendum)
Significantly depressed mood with evidence of psychomotor retardation.  He denies suicidal thoughts, intention, or plan.  Dr. [name] reviewed lab work and medical record.  Discussed recommended plan as follows:  FMLA:  Would help make sure he doesn't lose his job but runs the risk of an easy excuse for calling out.  Decided against this at present. Inpatient:  Not interested in 5-7 acute stay; looking for a wellness treatment facility but most he has found need a co-occuring substance use disorder. 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 Therapy:  Had a not so good 2nd session with Dr. Parke Simmers.  Discussed options.  Recommended two therapists.  Tyge thinks it is necessary to get connected.   Medications:  His list of tried medications is very long.  Dr. [name] thinks Cymbalta had the best reported benefit so far.  Given the severity of Graylon's depression, Dr. [name] wanted to try several things at once.  - Cymbalta 60 mg daily  - Vit D 2000 units  - L-methylfolate 15 mg  - Lyothyronine 25 mcg  - Keep Risperdal the same  - Keep Ativan the same (he reported he is taking about one pill 4 out of 7 days)  Jaycean able to repeat back most of the treatment plan.  Will see in two weeks and talk as needed.

## 2016-01-13 NOTE — Patient Instructions (Addendum)
Please schedule a follow-up for:  March 1st at 11:30.   Dr. [name] recommended several changes today: - Keep the Risperdal where it is - Add Cymbalta 60 mg - best taken in the evening with a meal - Start the thyroid medicine Dr. [name] talked to you about - You can get the Folic Acid on [place] as Dr. [name] described - He also recommended adding Vitamin D since your level was so low before  We recommended two therapists for you to see if you might connect with them better.  Josph Macho May can be reached at [phone].  Dr. Cheryln Manly can be reached at [phone].

## 2016-01-13 NOTE — Progress Notes (Signed)
Kalev states that "he can't go any lower" in terms of his mood.  He has noticed increasing crying spells and has missed two more days of work.  His somatic symptoms are worse as well and he notices himself "stuffing" his emotional experience whenever around people.  He denies suicidal ideation but notes disturbing dreams about dying.  He is thinking a lot about his financial debt and is not socially connected with his family or his colleagues.  He does note that going to work is helpful in terms of structure.  It can be hard to do his job though.  Discussed inpatient, IOP programs, therapy referrals, FMLA, medications, and behavioral interventions.

## 2016-01-14 ENCOUNTER — Telehealth: Payer: Self-pay | Admitting: Psychology

## 2016-01-14 LAB — COMPREHENSIVE METABOLIC PANEL
ALBUMIN: 4.6 g/dL (ref 3.6–5.1)
ALT: 42 U/L (ref 9–46)
AST: 24 U/L (ref 10–40)
Alkaline Phosphatase: 91 U/L (ref 40–115)
BILIRUBIN TOTAL: 0.4 mg/dL (ref 0.2–1.2)
BUN: 15 mg/dL (ref 7–25)
CHLORIDE: 104 mmol/L (ref 98–110)
CO2: 28 mmol/L (ref 20–31)
CREATININE: 1.12 mg/dL (ref 0.60–1.35)
Calcium: 9.7 mg/dL (ref 8.6–10.3)
Glucose, Bld: 95 mg/dL (ref 65–99)
Potassium: 4.5 mmol/L (ref 3.5–5.3)
SODIUM: 140 mmol/L (ref 135–146)
Total Protein: 7.2 g/dL (ref 6.1–8.1)

## 2016-01-14 NOTE — Telephone Encounter (Signed)
Discussed lab results with Dr. [name].  Called patient and left a VM indicated that everything came back in the normal range.  Will follow-up at next appointment or he can call back to discuss.

## 2016-01-15 ENCOUNTER — Telehealth: Payer: Self-pay | Admitting: Psychology

## 2016-01-15 NOTE — Telephone Encounter (Signed)
Darris left a VM stating he has had a bad headache since starting the Cymbalta and has felt more on edge emotionally with crying spells.  Did not attend work today.  Discussed with Dr. [name] who recommended Linvel continue with the medication and take an additional Ativan if need be.  Discussed suicidal ideation.  Not currently suicidal.  Discussed plan if this were to change.  He voiced an understanding of Dr. Johny Shears thinking (temporary or not related to the medicine) and agreed to continue.  Will follow up as scheduled or as needed.

## 2016-01-16 LAB — VITAMIN D 1,25 DIHYDROXY
VITAMIN D 1, 25 (OH) TOTAL: 39 pg/mL (ref 18–72)
VITAMIN D3 1, 25 (OH): 9 pg/mL
Vitamin D2 1, 25 (OH)2: 30 pg/mL

## 2016-01-26 ENCOUNTER — Telehealth: Payer: Self-pay | Admitting: Psychology

## 2016-01-26 NOTE — Telephone Encounter (Signed)
Called to cancel [facility] appointment for tomorrow secondary to Dr. [name] having the flu.  Rescheduled for 3/15 at 8:30.  Checked in with how he is doing.  Said he had a bad weekend with panic attacks.  Attended two performances (one in [place] and one in [employer]) scheduled this weekend.  Was looking forward to it but got overwhelmed / panicky with crowds, noise, stimulation.  Felt bad about things not going the way he wanted.    Headaches are still there but more dull.  Appetite is pretty poor.  Mood is reported about the same - discouraged about performance at work - feels like he isn't able to put a coherent thought together.  Explained that we likely can not make any changes prior to seeing him but said I would run things by Dr. [name].  He said he wrote down the new appointment time and said he was okay with the plan.

## 2016-01-28 ENCOUNTER — Other Ambulatory Visit: Payer: Self-pay

## 2016-01-28 ENCOUNTER — Ambulatory Visit (HOSPITAL_COMMUNITY)
Admission: RE | Admit: 2016-01-28 | Discharge: 2016-01-28 | Disposition: A | Discharge status: Home / Self Care | Payer: BLUE CROSS/BLUE SHIELD | Source: Ambulatory Visit | Attending: Family Medicine | Admitting: Family Medicine

## 2016-01-28 ENCOUNTER — Ambulatory Visit (INDEPENDENT_AMBULATORY_CARE_PROVIDER_SITE_OTHER): Payer: BLUE CROSS/BLUE SHIELD | Admitting: Family Medicine

## 2016-01-28 VITALS — BP 157/88 | HR 78 | Temp 98.3°F | Resp 18 | Wt 173.3 lb

## 2016-01-28 DIAGNOSIS — R251 Tremor, unspecified: Secondary | ICD-10-CM | POA: Diagnosis not present

## 2016-01-28 DIAGNOSIS — F331 Major depressive disorder, recurrent, moderate: Secondary | ICD-10-CM | POA: Diagnosis not present

## 2016-01-28 DIAGNOSIS — R0789 Other chest pain: Secondary | ICD-10-CM

## 2016-01-28 DIAGNOSIS — T43205A Adverse effect of unspecified antidepressants, initial encounter: Secondary | ICD-10-CM

## 2016-01-28 LAB — GLUCOSE, CAPILLARY: Glucose-Capillary: 88 mg/dL (ref 65–99)

## 2016-01-28 NOTE — Assessment & Plan Note (Signed)
Patient's presentation consistent with withdrawal from cymbalta given that he stopped it abruptly 3-4 days ago due to side effect intolerance. Possibly has some component of panic attack. EKG and CBG normal. No SI today, though appears visibly depressed. Will restart patient's cymbalta at half dose and taper off over the next 2 weeks .Patient has appointment with mood disorder clinic in 2 weeks.

## 2016-01-28 NOTE — Progress Notes (Signed)
    Subjective:  Douglas Knight is a 30 y.o. male who presents to the [facility] today for same day appointment with a chief complaint of tremor.   HPI:  Tremor Patient presents with diffuse tremors for about an hour and a half. Associated symptoms include nausea, headache, chest "pressure," palpitations, and chills. Patient has a history of panic attacks, however says that this is not like a normal panic attack for him. No fevers or recent illnesses. Describes the chest pressure as like "someone hitting me in the chest" but says that it is not painful. No shortness of breath. No vomiting or diarrhea.  Patient was started on cymbalta 60mg  2 weeks ago, however he stopped 3-4 days ago because he did not like the side effects. Patient otherwise has been compliant with his medications and has not taken any additional medications. He last took a dose of Ativan 3-4 days ago.   ROS: Per HPI  Objective:  Physical Exam: BP 157/88 mmHg  Pulse 78  Temp(Src) 98.3 F (36.8 C) (Oral)  Resp 18  Wt 173 lb 4.8 oz (78.608 kg)  SpO2 99%  Gen: Anxious appearing 30 year old male in NAD sitting on exam table, poor eye contact HEENT: EOMI with 2-3 beats of clonus with lateral movement bilaterally. PERRL. MMM CV: RRR with no murmurs appreciated Pulm: NWOB, CTAB with no crackles, wheezes, or rhonchi GI: Normal bowel sounds present. Soft, Nontender, Nondistended. MSK: no edema, cyanosis, or clubbing noted Skin: warm, dry Neuro: CN2-12 intact. Motor strength 5/5 in upper and lower extremities. Sensation to light touch grossly intact. No tremor observed, though patient appears restless.  Psych: Patient anxious and agitated appearing though overall has blunted affect with poor eye contact. No SI or HI. Speech normal.   Results for orders placed or performed in visit on 01/28/16 (from the past 72 hour(s))  Glucose, capillary     Status: None   Collection Time: 01/28/16  2:19 PM  Result Value Ref Range   Glucose-Capillary 88 65 - 99 mg/dL   EKG: NSR  Assessment/Plan:  Antidepressant Withdrawal Syndrome Major depressive disorder, recurrent episode, moderate (HCC) Patient's presentation consistent with withdrawal from cymbalta given that he stopped it abruptly 3-4 days ago due to side effect intolerance. Possibly has some component of panic attack. EKG and CBG normal. No SI today, though appears visibly depressed. Will restart patient's cymbalta at half dose and taper off over the next 2 weeks .Patient has appointment with mood disorder clinic in 2 weeks.   Algis Greenhouse. Jerline Pain, [facility] Resident PGY-2 01/28/2016 2:47 PM

## 2016-01-28 NOTE — Patient Instructions (Signed)
Your symptoms are due to withdrawal effects from the cymbalta. Restarting the ativan will help.  Please start back taking half a tablet of your cymbalta for the next 4 days, then take half a tablet every other day for 4 days, then take a half a tablet every 3 days for 2 doses then stop.   Please come back to see Dr [name] and Dr Gwenlyn Saran soon.  If you need anything else, let us know.  Take care,  Dr Jerline Pain

## 2016-02-10 ENCOUNTER — Ambulatory Visit (INDEPENDENT_AMBULATORY_CARE_PROVIDER_SITE_OTHER): Payer: BLUE CROSS/BLUE SHIELD | Admitting: Psychology

## 2016-02-10 DIAGNOSIS — F331 Major depressive disorder, recurrent, moderate: Secondary | ICD-10-CM | POA: Diagnosis not present

## 2016-02-10 NOTE — Progress Notes (Signed)
Reason for follow-up:  Continued difficulty with depression.  Recently was tapered off of Cymbalta secondary to side effects (headaches, decreased appetite, sexual side effects.  Continues on Risperdal, Ativan.  Not taking Cytomel or Deplin.    Issues discussed:  Medication, ECT, Treatment / Wellness programs, therapy.  Girlfriend.  Learned that he has a live-in girlfriend.  They have been together for 1.5 years.  Identified goals:  He will research options and come back to discuss.

## 2016-02-10 NOTE — Assessment & Plan Note (Addendum)
Mood is reported as depressed and irritable.  Affect is consistent.  Also reports irritability, anxiety, poor sleep.  Not functioning well at work.  Supervisor has noticed.  Denied SI currently.    He called therapists.  Has an appointment with Georgana Curio in three weeks.  Previous therapist cost prohibitive.  Other therapist did not return phone call.  Expressed that treatment team has exhausted most of the possibilities.  Trying newly release medications is an option but he has been tried on older versions of the same class without success.  The only class he hasn't been tried on is MAOIs.  Discussed efficacy and potential safety issues.  Reviewed food and medicine restrictions.  He will research.  See patient instructions for further plan.

## 2016-02-10 NOTE — Patient Instructions (Addendum)
Please follow-up with [place] on 3/22 at 11:30.   Please research Parnate, Emsam (selegiline patch), and rexulti (replacing Risperdal).  You can also research ECT at [facility]. Trying gabapentin was a strategy that you proposed that Dr. [name] thought was reasonable.  Take one pill three times a day.  If drowsiness is an issue, you can split the dose or take it all in the evening.

## 2016-02-17 ENCOUNTER — Ambulatory Visit: Payer: BLUE CROSS/BLUE SHIELD | Admitting: Psychology

## 2016-02-17 ENCOUNTER — Telehealth: Payer: Self-pay | Admitting: Psychology

## 2016-02-17 NOTE — Telephone Encounter (Addendum)
Douglas Knight called to cancel Douglas Knight follow-up because of work commitments.  He stated he has been thinking about his options.  ECT "scares" him but he would be open to a consult.  He called Dr. Donia Pounds office - a psychiatrist Dr. [name] recommended, and has an intake appointment on 02/24/16.  He will not see the physician at that appointment.  He scheduled a follow-up for 03/02/16 to check-in and help determine next steps.  We will not continue to advise him on pharmacologic options if he establishes with a psychiatrist.

## 2016-03-02 ENCOUNTER — Ambulatory Visit (INDEPENDENT_AMBULATORY_CARE_PROVIDER_SITE_OTHER): Payer: BLUE CROSS/BLUE SHIELD | Admitting: Psychology

## 2016-03-02 DIAGNOSIS — F331 Major depressive disorder, recurrent, moderate: Secondary | ICD-10-CM

## 2016-03-02 NOTE — Progress Notes (Signed)
Reason for follow-up:  Continued treatment of treatment resistant depression.    Issues discussed:  He had an appointment scheduled with Dr. [name] mutltidisciplinary clinic but had to cancel.  Expects to get another appointment in two weeks or so.  Is going to search for a therapist within this practice.  Will wait to pursue any additional treatment (MAOi, ECT, [place]) until he meets with him.    He reported he has not missed any work in the last two weeks.  Overall, feeling better with increased productivity.  He cites the fact he has worked a few weekends which has given him a day off during the week.  He has been productive during this time and it has felt good.  Has joined [employer] and plans to go twice during the week and for group activities on Saturday.  His parents are helping him some financially which is a bit of a relief.    Identified goals:  Transfer care to Dr. Barbie Banner, a psychiatrist that Dr. [name] recommended.

## 2016-03-02 NOTE — Assessment & Plan Note (Signed)
Report of mood is improved (not likely not depressed but definitely less depressed).  Affect is significantly brighter with what seems like genuine smiling and laughter and increased verbal output.  Treatment team questioned whether there might be a seasonal component to his depression.  He notices some shifts but not on a yearly basis.  We asked Tanvir to let us know if we can help in the future.  Based on his history and how he presented the last few times, a higher level of care was recommended.

## 2016-03-29 DIAGNOSIS — F411 Generalized anxiety disorder: Secondary | ICD-10-CM | POA: Diagnosis not present

## 2016-03-29 DIAGNOSIS — F4010 Social phobia, unspecified: Secondary | ICD-10-CM | POA: Diagnosis not present

## 2016-03-29 DIAGNOSIS — F339 Major depressive disorder, recurrent, unspecified: Secondary | ICD-10-CM | POA: Diagnosis not present

## 2016-04-04 DIAGNOSIS — F3340 Major depressive disorder, recurrent, in remission, unspecified: Secondary | ICD-10-CM | POA: Diagnosis not present

## 2016-04-04 DIAGNOSIS — F411 Generalized anxiety disorder: Secondary | ICD-10-CM | POA: Diagnosis not present

## 2016-04-04 DIAGNOSIS — F4010 Social phobia, unspecified: Secondary | ICD-10-CM | POA: Diagnosis not present

## 2016-04-29 ENCOUNTER — Encounter: Payer: Self-pay | Admitting: Family Medicine

## 2016-04-29 ENCOUNTER — Ambulatory Visit (INDEPENDENT_AMBULATORY_CARE_PROVIDER_SITE_OTHER): Payer: BLUE CROSS/BLUE SHIELD | Admitting: Family Medicine

## 2016-04-29 VITALS — BP 140/91 | HR 82 | Temp 98.1°F | Ht 70.0 in | Wt 189.0 lb

## 2016-04-29 DIAGNOSIS — J309 Allergic rhinitis, unspecified: Secondary | ICD-10-CM | POA: Insufficient documentation

## 2016-04-29 DIAGNOSIS — R7989 Other specified abnormal findings of blood chemistry: Secondary | ICD-10-CM

## 2016-04-29 DIAGNOSIS — G43009 Migraine without aura, not intractable, without status migrainosus: Secondary | ICD-10-CM

## 2016-04-29 DIAGNOSIS — I10 Essential (primary) hypertension: Secondary | ICD-10-CM | POA: Diagnosis not present

## 2016-04-29 DIAGNOSIS — G4700 Insomnia, unspecified: Secondary | ICD-10-CM

## 2016-04-29 DIAGNOSIS — F331 Major depressive disorder, recurrent, moderate: Secondary | ICD-10-CM | POA: Diagnosis not present

## 2016-04-29 DIAGNOSIS — G43909 Migraine, unspecified, not intractable, without status migrainosus: Secondary | ICD-10-CM | POA: Insufficient documentation

## 2016-04-29 DIAGNOSIS — R748 Abnormal levels of other serum enzymes: Secondary | ICD-10-CM

## 2016-04-29 MED ORDER — SUMATRIPTAN SUCCINATE 25 MG PO TABS: 25.0000 mg | ORAL_TABLET | ORAL | Status: DC | PRN

## 2016-04-29 MED ORDER — GABAPENTIN 100 MG PO CAPS: 300.0000 mg | ORAL_CAPSULE | Freq: Every day | ORAL | Status: DC

## 2016-04-29 NOTE — Patient Instructions (Signed)
It was nice to see you today.  Anxiety/Mood - continue to see Dr. Barbie Banner and his team  Acne - continue the Benzoyl Peroxide and Clindamycin  Blood Pressure - continue Norvasc  Migraines - take Imitrex as needed  Come back in a month to follow up on Migraines.

## 2016-04-29 NOTE — Progress Notes (Signed)
   Subjective:    Patient ID: Douglas Knight, male    DOB: August 09, 1986, 30 y.o.   MRN: [id number]  HPI 30 y/o male presents for routine follow up of HTN and Anxiety/Depression.   HTN - on Norvasc 5 mg daily, reports compliance, chest pain with anxiety/panic  Anxiety/Depression - Followed previously with Dr. Lyndle Herrlich. [name] (last visit 03/02/16), Risperdal 0.5 mg QHS, was started on Gapapentin 300 mg QHS (mild improvement of sleep)  Referred to Dr. [name] [facility], was seen by a psychiatric NP - started on Zyprexa (had worsening mood, poor sleep), NP wanted to start Prozac but has previously failed this therapy in the past. Ativan was discontinued (however has been taking previous prescription that is left over). Lunesta switched to Ambien CR   Hx. Elevated Cr. - no urinary complaints  Acne - still using Benzoyl peroxide and Clindamycin gel, goes through flares intermittently, worse with shaving, overall improved  Migraines - ?allergy related, sensitive to light, nausea, across head, last 1-2 hours, has attempted otc migraine meds (Excedrine Migraine). Occur several times per week. Does report seasonal allergies/runny nose/congestion however this has improved over the past few weeks as pollen levels have decreased.   Social - still working as Education officer, museum, still having difficulty with fatigue/tiredness, not getting work done in a time fashion due to this, had been calling out frequently early in the year, has not missed work in a month now   Review of Systems See above    Objective:   Physical Exam BP 140/91 mmHg  Pulse 82  Temp(Src) 98.1 F (36.7 C) (Oral)  Ht 5\' 10"  (1.778 m)  Wt 189 lb (85.73 kg)  BMI 27.12 kg/m2 Gen: pleasant male, NAD Cardiac: RRR, S1 and S2 present, no murmur Resp: CTAB, normal effort Psych: well dressed, affect is outgoing, mood is anxious, no flight of ideas, no tangential thoughts, no SI, no HI   PHQ-9 score of 16 (very  difficult) GAD-7 score of 16 (very difficult)  TSH 12/2015 wnl BMP 11/2015 Cr 1.10    Assessment & Plan:  Major depressive disorder, recurrent episode, moderate ([place]) Patient continues to have daily depressed mood and anxiety (PHQ-9 score of 16 and GAD-7 score of 16). Recently referred to office of Dr. [name] (seen by NP). Had multiple medication changes. Prozac prescribed (did not take as previous treatment failure), started Zyprexa (mulitple side effects), continues Risperday 0.5 mg QHS and Gabapentin 300 mg QHS. Ativan discontinued by NP (however patient taking leftover pills that were previously prescribed).  -I encouraged regular follow up with Dr. [name] and patient is amenable to following their treatment recommendations -refill of Gabapentin provided (suggested that this be filled by Dr. [name] office in the future)  Essential hypertension Mildly elevated but much improved with Norvasc 5 mg. Anxiety likely contributing to elevated BP. -no changes in therapy today  Insomnia NP at Dr. [name] office changed him from [place] to Ambien CR. Symptoms unchanged. -continue current therapy, patient to get sleep aids from Dr. [name] office  Elevated serum creatinine Improved. Cr 1.10 at last check in early 2017.  Migraine Headaches 1-2 times per week consistent with migraines. -trial of Imitrex  Allergic rhinitis Patient has symptoms consistent with allergic rhinitis in early spring (now improved as pollen counts down) -no treatment today however consider anti-histimine in future

## 2016-04-29 NOTE — Assessment & Plan Note (Signed)
Patient continues to have daily depressed mood and anxiety (PHQ-9 score of 16 and GAD-7 score of 16). Recently referred to office of Dr. [name] (seen by NP). Had multiple medication changes. Prozac prescribed (did not take as previous treatment failure), started Zyprexa (mulitple side effects), continues Risperday 0.5 mg QHS and Gabapentin 300 mg QHS. Ativan discontinued by NP (however patient taking leftover pills that were previously prescribed).  -I encouraged regular follow up with Dr. [name] and patient is amenable to following their treatment recommendations -refill of Gabapentin provided (suggested that this be filled by Dr. [name] office in the future)

## 2016-04-29 NOTE — Assessment & Plan Note (Signed)
Headaches 1-2 times per week consistent with migraines. -trial of Imitrex

## 2016-04-29 NOTE — Assessment & Plan Note (Signed)
Patient has symptoms consistent with allergic rhinitis in early spring (now improved as pollen counts down) -no treatment today however consider anti-histimine in future

## 2016-04-29 NOTE — Assessment & Plan Note (Signed)
NP at Dr. [name] office changed him from [place] to Ambien CR. Symptoms unchanged. -continue current therapy, patient to get sleep aids from Dr. [name] office

## 2016-04-29 NOTE — Assessment & Plan Note (Signed)
Mildly elevated but much improved with Norvasc 5 mg. Anxiety likely contributing to elevated BP. -no changes in therapy today

## 2016-04-29 NOTE — Assessment & Plan Note (Signed)
Improved. Cr 1.10 at last check in early 2017.

## 2016-05-04 DIAGNOSIS — F3340 Major depressive disorder, recurrent, in remission, unspecified: Secondary | ICD-10-CM | POA: Diagnosis not present

## 2016-05-04 DIAGNOSIS — F411 Generalized anxiety disorder: Secondary | ICD-10-CM | POA: Diagnosis not present

## 2016-05-04 DIAGNOSIS — F4010 Social phobia, unspecified: Secondary | ICD-10-CM | POA: Diagnosis not present

## 2016-05-20 ENCOUNTER — Encounter: Payer: Self-pay | Admitting: Internal Medicine

## 2016-05-20 ENCOUNTER — Ambulatory Visit (INDEPENDENT_AMBULATORY_CARE_PROVIDER_SITE_OTHER): Payer: BLUE CROSS/BLUE SHIELD | Admitting: Internal Medicine

## 2016-05-20 VITALS — BP 131/85 | HR 81 | Temp 98.5°F | Wt 186.0 lb

## 2016-05-20 DIAGNOSIS — J069 Acute upper respiratory infection, unspecified: Secondary | ICD-10-CM | POA: Diagnosis not present

## 2016-05-20 MED ORDER — DM-GUAIFENESIN ER 30-600 MG PO TB12: 1.0000 | ORAL_TABLET | Freq: Two times a day (BID) | ORAL | Status: DC

## 2016-05-20 MED ORDER — BENZONATATE 200 MG PO CAPS: 200.0000 mg | ORAL_CAPSULE | Freq: Three times a day (TID) | ORAL | Status: DC | PRN

## 2016-05-20 MED ORDER — ONDANSETRON HCL 4 MG PO TABS: 4.0000 mg | ORAL_TABLET | Freq: Three times a day (TID) | ORAL | Status: DC | PRN

## 2016-05-20 NOTE — Patient Instructions (Signed)
Cough likely due to a viral infection You should be better in: next week  Call us if you have severe shortness of breath, high fever or are not better in 2 weeks

## 2016-05-20 NOTE — Progress Notes (Signed)
   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 Dry heaving but no vomiting  - Congestion  - No muscle aches  - DayQuil has helped some.    Past Medical History Reviewed problem list.  Medications- reviewed and updated No additions to family history Social history- patient is a non- smoker  Objective: BP 131/85 mmHg  Pulse 81  Temp(Src) 98.5 F (36.9 C) (Oral)  Wt 186 lb (84.369 kg)  SpO2 96% Gen: NAD, alert, cooperative with exam HEENT: PERRL, TMs nml Neck: FROM, supple, no lymphadenopathy  CV: RRR, good S1/S2, no murmur, Resp: CTABL, no wheezes, non-labored  Assessment/Plan: See problem based a/p  URI, acute URI symptoms, sick contacts, likely viral with no report of fevers. Here for symptomatic treatment of cough and nausea.  - benzonatate (TESSALON) 200 MG capsule; Take 1 capsule (200 mg total) by mouth 3 (three) times daily as needed for cough.  Dispense: 20 capsule; Refill: 0 - dextromethorphan-guaiFENesin (MUCINEX DM) 30-600 MG 12hr tablet; Take 1 tablet by mouth 2 (two) times daily.  Dispense: 20 tablet; Refill: 0 - ondansetron (ZOFRAN) 4 MG tablet; Take 1 tablet (4 mg total) by mouth every 8 (eight) hours as needed for nausea or vomiting.  Dispense: 20 tablet; Refill: 0

## 2016-05-21 NOTE — Assessment & Plan Note (Signed)
URI symptoms, sick contacts, likely viral with no report of fevers. Here for symptomatic treatment of cough and nausea.  - benzonatate (TESSALON) 200 MG capsule; Take 1 capsule (200 mg total) by mouth 3 (three) times daily as needed for cough.  Dispense: 20 capsule; Refill: 0 - dextromethorphan-guaiFENesin (MUCINEX DM) 30-600 MG 12hr tablet; Take 1 tablet by mouth 2 (two) times daily.  Dispense: 20 tablet; Refill: 0 - ondansetron (ZOFRAN) 4 MG tablet; Take 1 tablet (4 mg total) by mouth every 8 (eight) hours as needed for nausea or vomiting.  Dispense: 20 tablet; Refill: 0

## 2016-05-26 ENCOUNTER — Other Ambulatory Visit: Payer: Self-pay | Admitting: Family Medicine

## 2016-06-02 DIAGNOSIS — F39 Unspecified mood [affective] disorder: Secondary | ICD-10-CM | POA: Diagnosis not present

## 2016-06-02 DIAGNOSIS — F332 Major depressive disorder, recurrent severe without psychotic features: Secondary | ICD-10-CM | POA: Diagnosis not present

## 2016-06-02 DIAGNOSIS — F5109 Other insomnia not due to a substance or known physiological condition: Secondary | ICD-10-CM | POA: Diagnosis not present

## 2016-06-02 DIAGNOSIS — F411 Generalized anxiety disorder: Secondary | ICD-10-CM | POA: Diagnosis not present

## 2016-07-21 DIAGNOSIS — G4730 Sleep apnea, unspecified: Secondary | ICD-10-CM | POA: Diagnosis not present

## 2016-07-21 DIAGNOSIS — F5109 Other insomnia not due to a substance or known physiological condition: Secondary | ICD-10-CM | POA: Diagnosis not present

## 2016-07-21 DIAGNOSIS — F332 Major depressive disorder, recurrent severe without psychotic features: Secondary | ICD-10-CM | POA: Diagnosis not present

## 2016-07-21 DIAGNOSIS — F411 Generalized anxiety disorder: Secondary | ICD-10-CM | POA: Diagnosis not present

## 2016-08-18 DIAGNOSIS — J069 Acute upper respiratory infection, unspecified: Secondary | ICD-10-CM | POA: Diagnosis not present

## 2016-08-26 ENCOUNTER — Telehealth: Payer: Self-pay | Admitting: Family Medicine

## 2016-08-26 NOTE — Telephone Encounter (Signed)
Spoke with patient, he complains of ongoing blood and mucus tinged diarrhea that has been going on for several weeks. Patient states he would prefer to see Dr. Ree Kida if he is agreeable to working him in sooner. Will forward to PCP.

## 2016-08-26 NOTE — Telephone Encounter (Signed)
Pt called because he has some serious concerns and needed to be seen and wants to see his doctor. The first available  Appointment is 09/22/16. He would like to see if Dr. Ree Kida could squeeze him next week. Please call patient to discuss options. jw

## 2016-08-26 NOTE — Telephone Encounter (Signed)
I have 3 circumcisions scheduled next Friday 10/6. OK to double book patient with second circumcision.

## 2016-08-26 NOTE — Telephone Encounter (Signed)
Left message on patient voicemail that MD is willing to double book him next Friday around 10:45. If patient calls back please double book him for this time.

## 2016-09-01 DIAGNOSIS — F411 Generalized anxiety disorder: Secondary | ICD-10-CM | POA: Diagnosis not present

## 2016-09-01 DIAGNOSIS — G4730 Sleep apnea, unspecified: Secondary | ICD-10-CM | POA: Diagnosis not present

## 2016-09-01 DIAGNOSIS — F5109 Other insomnia not due to a substance or known physiological condition: Secondary | ICD-10-CM | POA: Diagnosis not present

## 2016-09-01 DIAGNOSIS — F332 Major depressive disorder, recurrent severe without psychotic features: Secondary | ICD-10-CM | POA: Diagnosis not present

## 2016-09-02 ENCOUNTER — Ambulatory Visit (INDEPENDENT_AMBULATORY_CARE_PROVIDER_SITE_OTHER): Payer: BLUE CROSS/BLUE SHIELD | Admitting: Family Medicine

## 2016-09-02 ENCOUNTER — Encounter: Payer: Self-pay | Admitting: Family Medicine

## 2016-09-02 VITALS — BP 128/82 | HR 67 | Temp 98.2°F | Ht 70.0 in | Wt 183.6 lb

## 2016-09-02 DIAGNOSIS — I10 Essential (primary) hypertension: Secondary | ICD-10-CM

## 2016-09-02 DIAGNOSIS — K582 Mixed irritable bowel syndrome: Secondary | ICD-10-CM | POA: Diagnosis not present

## 2016-09-02 DIAGNOSIS — G43009 Migraine without aura, not intractable, without status migrainosus: Secondary | ICD-10-CM

## 2016-09-02 DIAGNOSIS — K602 Anal fissure, unspecified: Secondary | ICD-10-CM

## 2016-09-02 DIAGNOSIS — K921 Melena: Secondary | ICD-10-CM | POA: Diagnosis not present

## 2016-09-02 DIAGNOSIS — Z0000 Encounter for general adult medical examination without abnormal findings: Secondary | ICD-10-CM

## 2016-09-02 DIAGNOSIS — F331 Major depressive disorder, recurrent, moderate: Secondary | ICD-10-CM

## 2016-09-02 DIAGNOSIS — K644 Residual hemorrhoidal skin tags: Secondary | ICD-10-CM | POA: Insufficient documentation

## 2016-09-02 DIAGNOSIS — K589 Irritable bowel syndrome without diarrhea: Secondary | ICD-10-CM | POA: Insufficient documentation

## 2016-09-02 LAB — HEMOCCULT GUIAC POC 1CARD (OFFICE): Fecal Occult Blood, POC: POSITIVE — AB

## 2016-09-02 MED ORDER — POLYETHYLENE GLYCOL 3350 17 GM/SCOOP PO POWD: 17.0000 g | Freq: Every day | ORAL | 1 refills | Status: DC

## 2016-09-02 MED ORDER — GABAPENTIN 100 MG PO CAPS: 200.0000 mg | ORAL_CAPSULE | Freq: Four times a day (QID) | ORAL | 1 refills | Status: DC

## 2016-09-02 MED ORDER — ALPRAZOLAM 1 MG PO TABS: 1.0000 mg | ORAL_TABLET | Freq: Two times a day (BID) | ORAL | 0 refills | Status: DC | PRN

## 2016-09-02 MED ORDER — SUMATRIPTAN SUCCINATE 25 MG PO TABS: 25.0000 mg | ORAL_TABLET | ORAL | 0 refills | Status: DC | PRN

## 2016-09-02 NOTE — Assessment & Plan Note (Signed)
Patient has symptoms consistent with IBS. Rectal exam did show small fissure and external hemorrhoid. -gave information on IBS diet -continue daily fiber and started trial of Miralax daily

## 2016-09-02 NOTE — Assessment & Plan Note (Signed)
Controlled with Norvasc 5 mg daily.

## 2016-09-02 NOTE — Patient Instructions (Addendum)
It was nice to see you today.  I am happy to hear that your mood and anxiety is improved.  Your blood pressure is well controlled.  I am going to attempt a trial of Miralax.   Irritable Bowel Syndrome, Adult Irritable bowel syndrome (IBS) is not one specific disease. It is a group of symptoms that affects the organs responsible for digestion (gastrointestinal or GI tract).  To regulate how your GI tract works, your body sends signals back and forth between your intestines and your brain. If you have IBS, there may be a problem with these signals. As a result, your GI tract does not function normally. Your intestines may become more sensitive and overreact to certain things. This is especially true when you eat certain foods or when you are under stress.  There are four types of IBS. These may be determined based on the consistency of your stool:   IBS with diarrhea.   IBS with constipation.   Mixed IBS.   Unsubtyped IBS.  It is important to know which type of IBS you have. Some treatments are more likely to be helpful for certain types of IBS.  CAUSES  The exact cause of IBS is not known. RISK FACTORS You may have a higher risk of IBS if:  You are a woman.  You are younger than 31 years old.  You have a family history of IBS.  You have mental health problems.  You have had bacterial infection of your GI tract. SIGNS AND SYMPTOMS  Symptoms of IBS vary from person to person. The main symptom is abdominal pain or discomfort. Additional symptoms usually include one or more of the following:   Diarrhea, constipation, or both.   Abdominal swelling or bloating.   Feeling full or sick after eating a small or regular-size meal.   Frequent gas.   Mucus in the stool.   A feeling of having more stool left after a bowel movement.  Symptoms tend to come and go. They may be associated with stress, psychiatric conditions, or nothing at all.  DIAGNOSIS  There is no specific  test to diagnose IBS. Your health care provider will make a diagnosis based on a physical exam, medical history, and your symptoms. You may have other tests to rule out other conditions that may be causing your symptoms. These may include:   Blood tests.   X-rays.   CT scan.  Endoscopy and colonoscopy. This is a test in which your GI tract is viewed with a long, thin, flexible tube. TREATMENT There is no cure for IBS, but treatment can help relieve symptoms. IBS treatment often includes:   Changes to your diet, such as:  Eating more fiber.  Avoiding foods that cause symptoms.  Drinking more water.  Eating regular, medium-sized portioned meals.  Medicines. These may include:  Fiber supplements if you have constipation.  Medicine to control diarrhea (antidiarrheal medicines).  Medicine to help control muscle spasms in your GI tract (antispasmodic medicines).  Medicines to help with any mental health issues, such as antidepressants or tranquilizers.  Therapy.  Talk therapy may help with anxiety, depression, or other mental health issues that can make IBS symptoms worse.  Stress reduction.  Managing your stress can help keep symptoms under control. HOME CARE INSTRUCTIONS   Take medicines only as directed by your health care provider.  Eat a healthy diet.  Avoid foods and drinks with added sugar.  Include more whole grains, fruits, and vegetables gradually into your  diet. This may be especially helpful if you have IBS with constipation.  Avoid any foods and drinks that make your symptoms worse. These may include dairy products and caffeinated or carbonated drinks.  Do not eat large meals.  Drink enough fluid to keep your urine clear or pale yellow.  Exercise regularly. Ask your health care provider for recommendations of good activities for you.  Keep all follow-up visits as directed by your health care provider. This is important. SEEK MEDICAL CARE IF:   You  have constant pain.  You have trouble or pain with swallowing.  You have worsening diarrhea. SEEK IMMEDIATE MEDICAL CARE IF:   You have severe and worsening abdominal pain.   You have diarrhea and:   You have a rash, stiff neck, or severe headache.   You are irritable, sleepy, or difficult to awaken.   You are weak, dizzy, or extremely thirsty.   You have bright red blood in your stool or you have black tarry stools.   You have unusual abdominal swelling that is painful.   You vomit continuously.   You vomit blood (hematemesis).   You have both abdominal pain and a fever.    This information is not intended to replace advice given to you by your health care provider. Make sure you discuss any questions you have with your health care provider.   Document Released: 11/14/2005 Document Revised: 12/05/2014 Document Reviewed: 08/01/2014 Elsevier Interactive Patient Education 2016 [place]. Diet for Irritable Bowel Syndrome When you have irritable bowel syndrome (IBS), the foods you eat and your eating habits are very important. IBS may cause various symptoms, such as abdominal pain, constipation, or diarrhea. Choosing the right foods can help ease discomfort caused by these symptoms. Work with your health care provider and dietitian to find the best eating plan to help control your symptoms. WHAT GENERAL GUIDELINES DO I NEED TO FOLLOW?  Keep a food diary. This will help you identify foods that cause symptoms. Write down:  What you eat and when.  What symptoms you have.  When symptoms occur in relation to your meals.  Avoid foods that cause symptoms. Talk with your dietitian about other ways to get the same nutrients that are in these foods.  Eat more foods that contain fiber. Take a fiber supplement if directed by your dietitian.  Eat your meals slowly, in a relaxed setting.  Aim to eat 5-6 small meals per day. Do not skip meals.  Drink enough fluids to  keep your urine clear or pale yellow.  Ask your health care provider if you should take an over-the-counter probiotic during flare-ups to help restore healthy gut bacteria.  If you have cramping or diarrhea, try making your meals low in fat and high in carbohydrates. Examples of carbohydrates are pasta, rice, whole grain breads and cereals, fruits, and vegetables.  If dairy products cause your symptoms to flare up, try eating less of them. You might be able to handle yogurt better than other dairy products because it contains bacteria that help with digestion. WHAT FOODS ARE NOT RECOMMENDED? The following are some foods and drinks that may worsen your symptoms:  Fatty foods, such as [place] fries.  Milk products, such as cheese or ice cream.  Chocolate.  Alcohol.  Products with caffeine, such as coffee.  Carbonated drinks, such as soda. The items listed above may not be a complete list of foods and beverages to avoid. Contact your dietitian for more information. WHAT FOODS ARE GOOD  SOURCES OF FIBER? Your health care provider or dietitian may recommend that you eat more foods that contain fiber. Fiber can help reduce constipation and other IBS symptoms. Add foods with fiber to your diet a little at a time so that your body can get used to them. Too much fiber at once might cause gas and swelling of your abdomen. The following are some foods that are good sources of fiber:  Apples.  Peaches.  Pears.  Berries.  Figs.  Broccoli (raw).  Cabbage.  Carrots.  Raw peas.  Kidney beans.  Lima beans.  Whole grain bread.  Whole grain cereal. FOR MORE INFORMATION  International Foundation for Functional Gastrointestinal Disorders: www.iffgd.[employer] of Diabetes and Digestive and Kidney Diseases: NetworkAffair.co.za.aspx   This information is not intended to replace advice given to you by your health  care provider. Make sure you discuss any questions you have with your health care provider.   Document Released: 02/04/2004 Document Revised: 12/05/2014 Document Reviewed: 02/14/2014 Elsevier Interactive Patient Education [employer].

## 2016-09-02 NOTE — Assessment & Plan Note (Signed)
Controlled with prn Imitrex.

## 2016-09-02 NOTE — Assessment & Plan Note (Signed)
12 o'clock position on exam today. Continue Preparation H as needed.

## 2016-09-02 NOTE — Progress Notes (Signed)
   Subjective:    Patient ID: Douglas Knight, male    DOB: Mar 01, 1986, 30 y.o.   MRN: [id number]  HPI 29 y/o male presents for follow up of depression/anxiety.    Anxiety/Depression  Recently returned to Dr. Albertine Patricia, Was unhappy with Dr. [name] office (only ever saw the NP). Gabapentin increased to 200 mg QID, mood is improved, still having trouble concentrating at work Ambien CR 12.5 QHS prn sleep Restarted Xanax 1 mg PRN (not needing every day, maybe every 1-2 days) Also returned to therapy with Dr. Ruthann Cancer twice per month.   Migraines Started on PRN Imitrex at last visit, HA improved with Imitrex, having less headaches with new medication regimen  HTN On Norvasc 5 mg, no side effects, no chest pain  HM Recently has had flu shot  Bowel Change Blood (mostly bright red) and mucous, associated constipation alternating with diarrhea, feeling of needing to go and not able to go, has been using preparation H which has helped, present for 6 weeks, some mild abdominal cramps, tolerating diet, no nausea; has been doing regular fiber at home, does use stool softener when needed. 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  Respiratory: Negative for chest tightness and shortness of breath.   Gastrointestinal: Positive for blood in stool, constipation and diarrhea. Negative for vomiting.       Objective:   Physical Exam BP 128/82   Pulse 67   Temp 98.2 F (36.8 C) (Oral)   Ht 5\' 10"  (1.778 m)   Wt 183 lb 9.6 oz (83.3 kg)   BMI 26.34 kg/m  Gen: pleasant male, NAD Cardiac: RRR, S1 and S2 present, no murmur Resp: CTAB, normal effort Abd: soft, bilateral lower quadrant tenderness, normal bowel sounds Rectal: small fissure at 6 o'clock, small external non-bleeding  hemorrhoid at 12 o'clock. Normal tone on rectal exam. Prostate normal size without mass. No internal hemorrhoids on DRE. Anoscopy performed and identified to internal hemorrhoids.   PHQ 9 score of 12 GAD 7 score of 11 POC fecal occult pending    Assessment & Plan:  Major depressive disorder, recurrent episode, moderate (HCC) Patient has returned to Dr. Albertine Patricia and Dr. Ruthann Cancer. Symptoms stable on Gabapentin, Xanax prn, and Ambien CR for insomnia  Preventative health care Up to date on vaccines.   Essential hypertension Controlled with Norvasc 5 mg daily.   Migraine Controlled with prn Imitrex.   Irritable bowel syndrome Patient has symptoms consistent with IBS. Rectal exam did show small fissure and external hemorrhoid. -gave information on IBS diet -continue daily fiber and started trial of Miralax daily  External hemorrhoid 12 o'clock position on exam today. Continue Preparation H as needed.   Rectal fissure Found at 6 o'clock position today.  -trial of Miralax and Fiber

## 2016-09-02 NOTE — Assessment & Plan Note (Signed)
Up to date on vaccines.

## 2016-09-02 NOTE — Assessment & Plan Note (Signed)
Patient has returned to Dr. Albertine Patricia and Dr. Ruthann Cancer. Symptoms stable on Gabapentin, Xanax prn, and Ambien CR for insomnia

## 2016-09-02 NOTE — Assessment & Plan Note (Signed)
Found at 6 o'clock position today.  -trial of Miralax and Fiber

## 2016-09-13 DIAGNOSIS — L700 Acne vulgaris: Secondary | ICD-10-CM | POA: Diagnosis not present

## 2016-09-13 DIAGNOSIS — Z79899 Other long term (current) drug therapy: Secondary | ICD-10-CM | POA: Diagnosis not present

## 2016-09-28 NOTE — Progress Notes (Addendum)
   Subjective:    Patient ID: Douglas Knight, male    DOB: 10-Jun-1986, 30 y.o.   MRN: [id number]  HPI 30 y/o male presents for follow up of depression/anxiety and rectal bleeding.   Rectal bleeding/IBS Started on Miralax at last visit (stopped 1.5 weeks ago). Has continued to have abdominal pain, 4-5 bowel movements per day (sometimes loose and sometimes hard), has feeling like he has to stool but not able to; occasional blood in stool (very little ,streaking, no melena). Has taken immodium to help with symptoms. Has cut down on dairy and has not improved symptoms. Previously worked up for Celiac disease (negative). Was on a gluten diet in his teens that did not improve symptoms.   Anxiety/Depression Follows with Dr. Albertine Patricia and Dr. Ruthann Cancer  Social - nonsmoker  Review of Systems  Constitutional: Negative for chills and fatigue.  Cardiovascular: Negative for chest pain.  Gastrointestinal: Positive for abdominal distention, abdominal pain, blood in stool and diarrhea.       Objective:   Physical Exam BP (!) 151/89   Pulse 63   Temp 98.2 F (36.8 C) (Oral)   Ht 5\' 10"  (1.778 m)   Wt 185 lb 3.2 oz (84 kg)   BMI 26.57 kg/m   Gen: pleasant male, NAD Cardiac: RRR, S1 and S2 present, no murmur Resp: CTAB, normal effort Abd: soft, mild diffuse tenderness, normal bowel sounds      Assessment & Plan:  Irritable bowel syndrome IBS with primarily diarrhea type symptoms (meets Rome IV criteria). Previous GI scope negative per patient which makes Chron's/UC less likely. Symptoms not improved with eliminating dairy. Patient thinks that he was tested for Celiac disease but is uncertain.  -trial of Viberzi 100 mg BID (discussed potential risks of this medication including constipation, pancreatitis, abdominal pain) -referral to GI -check [facility] IGA to evaluate for celiac disease

## 2016-09-29 ENCOUNTER — Other Ambulatory Visit: Payer: BLUE CROSS/BLUE SHIELD

## 2016-09-29 ENCOUNTER — Encounter: Payer: Self-pay | Admitting: Family Medicine

## 2016-09-29 ENCOUNTER — Encounter: Payer: Self-pay | Admitting: Gastroenterology

## 2016-09-29 ENCOUNTER — Ambulatory Visit (INDEPENDENT_AMBULATORY_CARE_PROVIDER_SITE_OTHER): Payer: BLUE CROSS/BLUE SHIELD | Admitting: Family Medicine

## 2016-09-29 DIAGNOSIS — K582 Mixed irritable bowel syndrome: Secondary | ICD-10-CM

## 2016-09-29 MED ORDER — ELUXADOLINE 100 MG PO TABS: 1.0000 | ORAL_TABLET | Freq: Two times a day (BID) | ORAL | 2 refills | Status: DC

## 2016-09-29 NOTE — Patient Instructions (Addendum)
It was nice to see you today.  Dr. Ree Kida will call you with your lab results.   Start Viberzi twice daily.  Return in 3-4 weeks for follow up.

## 2016-09-29 NOTE — Assessment & Plan Note (Addendum)
IBS with primarily diarrhea type symptoms (meets Rome IV criteria). Previous GI scope negative per patient which makes Chron's/UC less likely. Symptoms not improved with eliminating dairy. Patient thinks that he was tested for Celiac disease but is uncertain.  -trial of Viberzi 100 mg BID (discussed potential risks of this medication including constipation, pancreatitis, abdominal pain) -referral to GI -check [facility] IGA to evaluate for celiac disease

## 2016-10-03 LAB — TISSUE TRANSGLUTAMINASE, IGA: Tissue Transglutaminase Ab, IgA: 1 U/mL (ref <4)

## 2016-10-03 LAB — TISSUE TRANSGLUTAMINASE, IGG: TISSUE TRANSGLUT AB: 1 U/mL (ref <6)

## 2016-10-06 ENCOUNTER — Encounter: Payer: Self-pay | Admitting: Family Medicine

## 2016-10-07 NOTE — Telephone Encounter (Signed)
Pt called about the email. He is miserable and needs advice on what to do. Please advise

## 2016-10-13 ENCOUNTER — Encounter: Payer: Self-pay | Admitting: Family Medicine

## 2016-10-14 DIAGNOSIS — Z79899 Other long term (current) drug therapy: Secondary | ICD-10-CM | POA: Diagnosis not present

## 2016-10-14 DIAGNOSIS — L700 Acne vulgaris: Secondary | ICD-10-CM | POA: Diagnosis not present

## 2016-10-17 ENCOUNTER — Other Ambulatory Visit: Payer: Self-pay | Admitting: Family Medicine

## 2016-10-17 DIAGNOSIS — K582 Mixed irritable bowel syndrome: Secondary | ICD-10-CM

## 2016-10-17 MED ORDER — AMITRIPTYLINE HCL 10 MG PO TABS: 10.0000 mg | ORAL_TABLET | Freq: Every day | ORAL | 2 refills | Status: DC

## 2016-10-17 NOTE — Assessment & Plan Note (Signed)
Patient reports (via Mychart) that symptoms not improved with Viberzi. Will start Amitriptyline 10 mg daily (he ok'ed this with his psychiatrist Dr. Albertine Patricia as well).

## 2016-10-25 DIAGNOSIS — F332 Major depressive disorder, recurrent severe without psychotic features: Secondary | ICD-10-CM | POA: Diagnosis not present

## 2016-10-25 DIAGNOSIS — G4730 Sleep apnea, unspecified: Secondary | ICD-10-CM | POA: Diagnosis not present

## 2016-10-25 DIAGNOSIS — F5109 Other insomnia not due to a substance or known physiological condition: Secondary | ICD-10-CM | POA: Diagnosis not present

## 2016-10-25 DIAGNOSIS — F411 Generalized anxiety disorder: Secondary | ICD-10-CM | POA: Diagnosis not present

## 2016-10-26 NOTE — Progress Notes (Signed)
   Subjective:    Patient ID: Douglas Knight, male    DOB: 1986-11-16, 30 y.o.   MRN: [id number]  HPI 30 y/o male presents for follow up of IBS.  Started on Vibrezi at last visit, had no improvement in symptoms (reported over MyChart). Recently started him on Amitriptyline 10 mg at night. Reports less diarrhea (now only 2-3 times per day), still has multiple urges to go during the day but when attempts to go is unable to.  Does report a sedating effect with the Amitryptyline but is tolerable because taking at night, has been able to cut down on Ambien. Hasn't taken his Ambien in the past 2 nights.Patient states that he is able to sleep throughout the night without urges to go to the bathroom. Reports a decrease in severity and frequency of abdominal pain flares. Douglas Knight is scheduled to see GI on Dec 01, 2017.    Review of Systems  Constitutional: Negative for chills, fatigue, fever and unexpected weight change.  HENT: Positive for congestion. Negative for facial swelling and trouble swallowing.   Eyes: Negative for pain, discharge, itching and visual disturbance.  Respiratory: Negative for chest tightness and shortness of breath.   Cardiovascular: Negative for chest pain and palpitations.  Gastrointestinal: Positive for abdominal pain, constipation and diarrhea. Negative for rectal pain and vomiting.  Genitourinary: Negative for difficulty urinating, dysuria, flank pain, frequency and hematuria.  Skin: Negative for color change and rash.  Neurological: Negative for dizziness, light-headedness, numbness and headaches.  Hematological: Does not bruise/bleed easily.         Objective:   Physical Exam BP (!) 137/91   Pulse 86   Temp 98.9 F (37.2 C) (Oral)   Ht 5\' 10"  (1.778 m)   Wt 180 lb (81.6 kg)   BMI 25.83 kg/m   GEN: Pleasant male. NAD NEURO: Alert and interacting appropriately.  HEENT: Normocephalic. Atraumatic. EOMI bilaterally. Nasal dorsum midline. No rhinorrhea or  epistaxis. No LAD. Trachea midline.  CV: Regular rate and rhythm. No murmurs or rubs.  Pulm: Lungs CTAB. No rhonchi, rales, wheezing or stridor.  Abd: Positive bowel sounds. Mild tenderness to palpation in Left upper and lower quadrant. No rebound or guarding.  Ext: Warm and well perfused. Strength 5/5 bilaterally.  SKIN: No rashes        Assessment & Plan:  Irritable bowel syndrome Improved on Amitriptyline. Stopped Vibrezi.  -continue current regiment -return in one month for follow up  Insomnia Improved with Amitriptyline. May be able to wean/discontinue Ambien in near future.

## 2016-10-27 ENCOUNTER — Ambulatory Visit (INDEPENDENT_AMBULATORY_CARE_PROVIDER_SITE_OTHER): Payer: BLUE CROSS/BLUE SHIELD | Admitting: Family Medicine

## 2016-10-27 ENCOUNTER — Encounter: Payer: Self-pay | Admitting: Family Medicine

## 2016-10-27 DIAGNOSIS — K582 Mixed irritable bowel syndrome: Secondary | ICD-10-CM

## 2016-10-27 DIAGNOSIS — G4700 Insomnia, unspecified: Secondary | ICD-10-CM | POA: Diagnosis not present

## 2016-10-27 MED ORDER — DOCUSATE SODIUM 100 MG PO CAPS: 100.0000 mg | ORAL_CAPSULE | Freq: Two times a day (BID) | ORAL | 2 refills | Status: DC

## 2016-10-27 NOTE — Patient Instructions (Signed)
Continue Amitriptyline 10 mg night, send my a MyChart message in 1-2 weeks if you would like to try the 25 mg tablet.  Start Colace 100 mg twice daily for constipation.

## 2016-10-27 NOTE — Assessment & Plan Note (Signed)
Improved with Amitriptyline. May be able to wean/discontinue Ambien in near future.

## 2016-10-27 NOTE — Assessment & Plan Note (Signed)
Improved on Amitriptyline. Stopped Vibrezi.  -continue current regiment -return in one month for follow up

## 2016-11-04 DIAGNOSIS — Z79899 Other long term (current) drug therapy: Secondary | ICD-10-CM | POA: Diagnosis not present

## 2016-11-04 DIAGNOSIS — L700 Acne vulgaris: Secondary | ICD-10-CM | POA: Diagnosis not present

## 2016-11-07 ENCOUNTER — Encounter: Payer: Self-pay | Admitting: Family Medicine

## 2016-11-07 DIAGNOSIS — L700 Acne vulgaris: Secondary | ICD-10-CM

## 2016-11-07 NOTE — Assessment & Plan Note (Signed)
Follows with [facility] Started Accutane 08/2016

## 2016-11-07 NOTE — Progress Notes (Signed)
Reviewed notes from [facility].  09/13/16 - Inflammatory Acne, Started Isotretinoin Scanned copy of records into EPIC

## 2016-11-08 ENCOUNTER — Encounter: Payer: Self-pay | Admitting: Family Medicine

## 2016-11-16 NOTE — Telephone Encounter (Signed)
Pt needs FMLA paperwork sent to employer by 11-18-16. Please fax it to [phone]. Thanks! ep

## 2016-11-30 DIAGNOSIS — L0889 Other specified local infections of the skin and subcutaneous tissue: Secondary | ICD-10-CM | POA: Diagnosis not present

## 2016-11-30 DIAGNOSIS — L0109 Other impetigo: Secondary | ICD-10-CM | POA: Diagnosis not present

## 2016-11-30 DIAGNOSIS — L308 Other specified dermatitis: Secondary | ICD-10-CM | POA: Diagnosis not present

## 2016-11-30 DIAGNOSIS — L700 Acne vulgaris: Secondary | ICD-10-CM | POA: Diagnosis not present

## 2016-11-30 DIAGNOSIS — Z79899 Other long term (current) drug therapy: Secondary | ICD-10-CM | POA: Diagnosis not present

## 2016-12-01 ENCOUNTER — Encounter: Payer: Self-pay | Admitting: Gastroenterology

## 2016-12-01 ENCOUNTER — Ambulatory Visit (INDEPENDENT_AMBULATORY_CARE_PROVIDER_SITE_OTHER): Payer: BLUE CROSS/BLUE SHIELD | Admitting: Gastroenterology

## 2016-12-01 VITALS — BP 118/84 | HR 86 | Ht 68.5 in | Wt 175.5 lb

## 2016-12-01 DIAGNOSIS — R197 Diarrhea, unspecified: Secondary | ICD-10-CM | POA: Diagnosis not present

## 2016-12-01 DIAGNOSIS — R1031 Right lower quadrant pain: Secondary | ICD-10-CM

## 2016-12-01 DIAGNOSIS — K625 Hemorrhage of anus and rectum: Secondary | ICD-10-CM

## 2016-12-01 MED ORDER — NA SULFATE-K SULFATE-MG SULF 17.5-3.13-1.6 GM/177ML PO SOLN
1.0000 | Freq: Once | ORAL | 0 refills | Status: AC
Start: 2016-12-01 — End: 2016-12-01

## 2016-12-01 NOTE — Progress Notes (Signed)
[name] Consult Note:  History: Douglas Knight 12/01/2016  Referring physician: Dossie Knight, Douglas Sciara, MD  Reason for consult/chief complaint: Rectal Bleeding (BRB on the toilet paper and in the toilet, Heme positive stool); Abdominal Pain (RLQ pain); Anal Fissure (and hemorrhoids); and Diarrhea (then constipation due to med for diarrhea)   Subjective  HPI:  This is a 31 year old man referred by primary care for abdominal pain and altered bowel habits. He reports that about 10 years ago he had some chronic abdominal pain, saw a GI physician: And was ultimately diagnosed with what sounds like functional abdominal pain. He was told it was psychosomatic" and related to stress. For the last several months he has had frequent crampy mid to lower abdominal pain with frequent BMs that are often loose, and some intermittent blood per rectum. Pain is worse on the right, is nonradiating, and has no clear triggers such as eating or time of day. It might come on before bowel movements, unclear if it is improved afterwards. He was tried on Viberzi without much improvement, and it also made him sleepy. He sees a psychiatrist and therapist for his affect of disorder, says he is unable to tolerate SSRIs, and feels those issues are doing well now with counseling, gabapentin, and Xanax. He denies dysphagia, odynophagia, nausea vomiting early satiety or weight loss.  ROS:  Review of Systems  Constitutional: Negative for appetite change and unexpected weight change.  HENT: Negative for mouth sores and voice change.   Eyes: Negative for pain and redness.  Respiratory: Negative for cough and shortness of breath.   Cardiovascular: Negative for chest pain and palpitations.  Genitourinary: Negative for dysuria and hematuria.  Musculoskeletal: Negative for arthralgias and myalgias.  Skin: Negative for pallor and rash.  Neurological: Negative for weakness and headaches.  Hematological: Negative for  adenopathy.  Psychiatric/Behavioral: The patient is nervous/anxious.      Past Medical History: Past Medical History:  Diagnosis Date  . Anal fissure   . Anxiety   . Depression   . Hypertension   . ITP (idiopathic thrombocytopenic purpura)      Past Surgical History: Past Surgical History:  Procedure Laterality Date  . APPENDECTOMY  2003   [facility]     Family History: Family History  Problem Relation Age of Onset  . Diabetes Father   . Hypertension Father   . Colon polyps Father   . Heart disease Maternal Grandfather   . Alzheimer's disease Paternal Grandmother   . Depression Paternal Grandfather   . Colon polyps Paternal Grandfather   . Colon cancer Paternal Aunt     Social History: Social History   Social History  . Marital status: Single    Spouse name: N/A  . Number of children: 0  . Years of education: N/A   Occupational History  . social worker/addiction specialist    Social History Main Topics  . Smoking status: Never Smoker  . Smokeless tobacco: Never Used  . Alcohol use 0.0 oz/week     Comment: rarely  . Drug use: No  . Sexual activity: Not Asked   Other Topics Concern  . None   Social History Narrative   01/2015 Currently working on Masters of SW at [employer]: Allergies  Allergen Reactions  . Sulfa Antibiotics Anaphylaxis, Hives and Other (See Comments)    Stomach pain  . Wellbutrin [Bupropion] Shortness Of Breath    Outpatient Meds: Current Outpatient Prescriptions  Medication Sig Dispense Refill  .  ALPRAZolam (XANAX) 1 MG tablet Take 1 tablet (1 mg total) by mouth 2 (two) times daily as needed for anxiety. 20 tablet 0  . amLODipine (NORVASC) 5 MG tablet Take 5 mg by mouth daily.    Marland Kitchen dextromethorphan-guaiFENesin (MUCINEX DM) 30-600 MG 12hr tablet Take 1 tablet by mouth 2 (two) times daily. 20 tablet 0  . docusate sodium (COLACE) 100 MG capsule Take 1 capsule (100 mg total) by mouth 2 (two) times daily.  60 capsule 2  . MYORISAN 40 MG capsule   0  . SUMAtriptan (IMITREX) 25 MG tablet Take 1 tablet (25 mg total) by mouth every 2 (two) hours as needed for migraine. May repeat in 2 hours if headache persists or recurs. 10 tablet 0  . zolpidem (AMBIEN CR) 12.5 MG CR tablet Take 12.5 mg by mouth Nightly.  1  . gabapentin (NEURONTIN) 100 MG capsule Take 2 capsules (200 mg total) by mouth 4 (four) times daily. Take one pill three times a day.  If drowsiness is an issue - you can take it all in the evening. 120 capsule 1  . Na Sulfate-K Sulfate-Mg Sulf 17.5-3.13-1.6 GM/180ML SOLN Take 1 kit by mouth once. 354 mL 0   No current facility-administered medications for this visit.       ___________________________________________________________________ Objective   Exam:  BP 118/84 (BP Location: Left Arm, Patient Position: Sitting, Cuff Size: Normal)   Pulse 86   Ht 5' 8.5" (1.74 m) Comment: height measured without shoes  Wt 175 lb 8 oz (79.6 kg)   BMI 26.30 kg/m    General: this is a(n) Pleasant, anxious, otherwise well-appearing young man with good muscle mass   Eyes: sclera anicteric, no redness  ENT: oral mucosa moist without lesions, no cervical or supraclavicular lymphadenopathy, good dentition  CV: RRR without murmur, S1/S2, no JVD, no peripheral edema  Resp: clear to auscultation bilaterally, normal RR and effort noted  GI: soft, moderate RLQ tenderness, with active bowel sounds. No guarding or palpable organomegaly noted.  Skin; warm and dry, positive acne , no jaundice. He has a rash on both forearms that he says has recently come up as a result of Accutane. He saw his dermatologist recently.  Neuro: awake, alert and oriented x 3. Normal gross motor function and fluent speech Rectal: Normal sphincter tone, no external lesions or palpable internal lesions, no fissure or tenderness. Labs:  ttg Ab neg  CBC Latest Ref Rng & Units 12/22/2015 02/01/2015 02/01/2015  WBC 4.0 - 10.5 K/uL  6.3 8.5 -  Hemoglobin 13.0 - 17.0 g/dL 15.1 14.4 14.3  Hematocrit 39.0 - 52.0 % 42.2 39.4 42.0  Platelets 150 - 400 K/uL 119(L) 143(L) -    Assessment: Encounter Diagnoses  Name Primary?  . RLQ abdominal pain Yes  . Diarrhea, unspecified type   . Rectal bleeding     This could be IBS with benign anal bleeding, but we must rule out IBD.  Plan:  Colonoscopy If negative, probably a trial of hyoscyamine  Thank you for the courtesy of this consult.  Please call me with any questions or concerns.  Nelida Meuse III  CC: Lupita Dawn, MD

## 2016-12-01 NOTE — Patient Instructions (Signed)
If you are age 32 or older, your body mass index should be between 23-30. Your Body mass index is 26.3 kg/m. If this is out of the aforementioned range listed, please consider follow up with your Primary Care Provider.  If you are age 18 or younger, your body mass index should be between 19-25. Your Body mass index is 26.3 kg/m. If this is out of the aformentioned range listed, please consider follow up with your Primary Care Provider.   You have been scheduled for a colonoscopy. Please follow written instructions given to you at your visit today.  Please pick up your prep supplies at the pharmacy within the next 1-3 days. If you use inhalers (even only as needed), please bring them with you on the day of your procedure. Your physician has requested that you go to www.startemmi.com and enter the access code given to you at your visit today. This web site gives a general overview about your procedure. However, you should still follow specific instructions given to you by our office regarding your preparation for the procedure.  Thank you for choosing [facility]  Dr Wilfrid Lund III

## 2016-12-06 ENCOUNTER — Ambulatory Visit (AMBULATORY_SURGERY_CENTER): Payer: BLUE CROSS/BLUE SHIELD | Admitting: Gastroenterology

## 2016-12-06 ENCOUNTER — Encounter: Payer: Self-pay | Admitting: Gastroenterology

## 2016-12-06 VITALS — BP 122/70 | HR 88 | Temp 99.1°F | Resp 16 | Ht 68.0 in | Wt 175.0 lb

## 2016-12-06 DIAGNOSIS — D125 Benign neoplasm of sigmoid colon: Secondary | ICD-10-CM

## 2016-12-06 DIAGNOSIS — R1031 Right lower quadrant pain: Secondary | ICD-10-CM

## 2016-12-06 DIAGNOSIS — R197 Diarrhea, unspecified: Secondary | ICD-10-CM

## 2016-12-06 DIAGNOSIS — K625 Hemorrhage of anus and rectum: Secondary | ICD-10-CM | POA: Diagnosis not present

## 2016-12-06 DIAGNOSIS — D124 Benign neoplasm of descending colon: Secondary | ICD-10-CM

## 2016-12-06 MED ORDER — SODIUM CHLORIDE 0.9 % IV SOLN
500.0000 mL | INTRAVENOUS | Status: DC
Start: 2016-12-06 — End: 2016-12-30

## 2016-12-06 NOTE — Progress Notes (Signed)
Report given to PACU RN, vss 

## 2016-12-06 NOTE — Patient Instructions (Signed)
YOU HAD AN ENDOSCOPIC PROCEDURE TODAY AT THE [facility]:   Refer to the procedure report that was given to you for any specific questions about what was found during the examination.  If the procedure report does not answer your questions, please call your gastroenterologist to clarify.  If you requested that your care partner not be given the details of your procedure findings, then the procedure report has been included in a sealed envelope for you to review at your convenience later.  YOU SHOULD EXPECT: Some feelings of bloating in the abdomen. Passage of more gas than usual.  Walking can help get rid of the air that was put into your GI tract during the procedure and reduce the bloating. If you had a lower endoscopy (such as a colonoscopy or flexible sigmoidoscopy) you may notice spotting of blood in your stool or on the toilet paper. If you underwent a bowel prep for your procedure, you may not have a normal bowel movement for a few days.  Please Note:  You might notice some irritation and congestion in your nose or some drainage.  This is from the oxygen used during your procedure.  There is no need for concern and it should clear up in a day or so.  SYMPTOMS TO REPORT IMMEDIATELY:   Following lower endoscopy (colonoscopy or flexible sigmoidoscopy):  Excessive amounts of blood in the stool  Significant tenderness or worsening of abdominal pains  Swelling of the abdomen that is new, acute  Fever of 100F or higher  For urgent or emergent issues, a gastroenterologist can be reached at any hour by calling [phone].   DIET:  We do recommend a small meal at first, but then you may proceed to your regular diet.  Drink plenty of fluids but you should avoid alcoholic beverages for 24 hours.  ACTIVITY:  You should plan to take it easy for the rest of today and you should NOT DRIVE or use heavy machinery until tomorrow (because of the sedation medicines used during the test).     FOLLOW UP: Our staff will call the number listed on your records the next business day following your procedure to check on you and address any questions or concerns that you may have regarding the information given to you following your procedure. If we do not reach you, we will leave a message.  However, if you are feeling well and you are not experiencing any problems, there is no need to return our call.  We will assume that you have returned to your regular daily activities without incident.  If any biopsies were taken you will be contacted by phone or by letter within the next 1-3 weeks.  Please call us at [phone] if you have not heard about the biopsies in 3 weeks.    SIGNATURES/CONFIDENTIALITY: You and/or your care partner have signed paperwork which will be entered into your electronic medical record.  These signatures attest to the fact that that the information above on your After Visit Summary has been reviewed and is understood.  Full responsibility of the confidentiality of this discharge information lies with you and/or your care-partner. 

## 2016-12-06 NOTE — Op Note (Signed)
[facility] Patient Name: Douglas Knight Procedure Date: 12/06/2016 1:20 PM MRN: [id number] Endoscopist: Mallie Mussel L. Loletha Carrow , MD Age: 31 Referring MD:  Date of Birth: [birth year] Gender: Male Account #: [id number] Procedure:                Colonoscopy Indications:              Abdominal pain in the left lower quadrant, Chronic                            diarrhea, Rectal bleeding Medicines:                Monitored Anesthesia Care Procedure:                Pre-Anesthesia Assessment:                           - Prior to the procedure, a History and Physical                            was performed, and patient medications and                            allergies were reviewed. The patient's tolerance of                            previous anesthesia was also reviewed. The risks                            and benefits of the procedure and the sedation                            options and risks were discussed with the patient.                            All questions were answered, and informed consent                            was obtained. Prior Anticoagulants: The patient has                            taken no previous anticoagulant or antiplatelet                            agents. ASA Grade Assessment: II - A patient with                            mild systemic disease. After reviewing the risks                            and benefits, the patient was deemed in                            satisfactory condition to undergo the procedure.  After obtaining informed consent, the colonoscope                            was passed under direct vision. Throughout the                            procedure, the patient's blood pressure, pulse, and                            oxygen saturations were monitored continuously. The                            Colonoscope was introduced through the anus and                            advanced to the the terminal  ileum. The colonoscopy                            was performed without difficulty. The patient                            tolerated the procedure well. The quality of the                            bowel preparation was excellent. The terminal                            ileum, ileocecal valve, appendiceal orifice, and                            rectum were photographed. The bowel preparation                            used was SUPREP. Scope In: 1:46:09 PM Scope Out: 1:59:33 PM Scope Withdrawal Time: 0 hours 11 minutes 6 seconds  Total Procedure Duration: 0 hours 13 minutes 24 seconds  Findings:                 The perianal and digital rectal examinations were                            normal.                           The terminal ileum appeared normal.                           Normal mucosa was found in the entire colon.                            Biopsies for histology were taken with a cold                            forceps from the right colon and left colon for  evaluation of microscopic colitis.                           A 12 mm polyp was found in the distal sigmoid                            colon. The polyp was pedunculated. The polyp was                            removed with a hot snare. Resection and retrieval                            were complete.                           The exam was otherwise without abnormality on                            direct and retroflexion views. Complications:            No immediate complications. Estimated Blood Loss:     Estimated blood loss: none. Impression:               - The examined portion of the ileum was normal.                           - Normal mucosa in the entire examined colon.                            Biopsied.                           - One 12 mm polyp in the distal sigmoid colon,                            removed with a hot snare. Resected and retrieved.                           - The  examination was otherwise normal on direct                            and retroflexion views.                           If biopsies negative for microscopic colitis,                            overall clinical picture most consistent with IBS                            and trial of hyoscyamine will be given. Recommendation:           - Patient has a contact number available for                            emergencies. The signs  and symptoms of potential                            delayed complications were discussed with the                            patient. Return to normal activities tomorrow.                            Written discharge instructions were provided to the                            patient.                           - Resume previous diet.                           - Continue present medications.                           - Await pathology results.                           - Repeat colonoscopy is recommended for                            surveillance. The colonoscopy date will be                            determined after pathology results from today's                            exam become available for review. [name] L. Loletha Carrow, MD 12/06/2016 2:10:00 PM This report has been signed electronically.

## 2016-12-06 NOTE — Progress Notes (Signed)
Called to room to assist during endoscopic procedure.  Patient ID and intended procedure confirmed with present staff. Received instructions for my participation in the procedure from the performing physician.  

## 2016-12-07 ENCOUNTER — Encounter: Payer: Self-pay | Admitting: Family Medicine

## 2016-12-07 ENCOUNTER — Telehealth: Payer: Self-pay

## 2016-12-07 DIAGNOSIS — K635 Polyp of colon: Secondary | ICD-10-CM | POA: Insufficient documentation

## 2016-12-07 NOTE — Progress Notes (Signed)
   Subjective:    Patient ID: Douglas Knight, male    DOB: [birth year], 31 y.o.   MRN: [id number]  HPI 31 y/o male presents for follow up of GI symptoms.  GI symptoms, Suspected IBS Had recent Colonoscopy (see results below). Previously prescribed Amitriptyline for symtpom control (no longer taking as affected mood, became more irritable). Not eating well therefore less trips to bathroom, continued abdominal discomfort, decreased appetite, some nausea.  Chronic Fatigue Worse over the past few months, associated weight loss, exercising daily over the past few weeks (off of work currently).  Social Currently on short term disability  Review of Systems     Objective:   Physical Exam BP 116/70   Pulse 91   Temp 97.9 F (36.6 C) (Oral)   Ht 5\' 8"  (1.727 m)   Wt 174 lb 9.6 oz (79.2 kg)   BMI 26.55 kg/m   Gen: pleasant male, NAD Cardiac: RRR, S1 and S2 present, no murmur Resp: CTAB, normal effort Abd: guarding present (felt like he was flexing abdominal muscles), normal bowel sounds  Colonoscpy: small polyp (12 mm of sigmoid colon), biopsies taken    Assessment & Plan:  Irritable bowel syndrome Patient has continued symptoms most consistent with Irritable Bowel Syndrome. Biopsies from colonoscopy pending. Patient stopped Amitriptyline.  -start trial of Hyoscyamine 0.125 mg Q4 prn -start phenergan to help with nausea  Fatigue Continued fatigue. Suspect due to recent poor oral intake related to GI symptoms.  -provided phenergan to help with nausea

## 2016-12-07 NOTE — Telephone Encounter (Signed)
  Follow up Call-  Call back number 12/06/2016  Post procedure Call Back phone  # [phone]  Permission to leave phone message Yes  Some recent data might be hidden     Patient questions:  Do you have a fever, pain , or abdominal swelling? No. Pain Score  0 *  Have you tolerated food without any problems? Yes.    Have you been able to return to your normal activities? Yes.    Do you have any questions about your discharge instructions: Diet   No. Medications  No. Follow up visit  No.  Do you have questions or concerns about your Care? No.  Actions: * If pain score is 4 or above: No action needed, pain <4.

## 2016-12-08 ENCOUNTER — Ambulatory Visit (INDEPENDENT_AMBULATORY_CARE_PROVIDER_SITE_OTHER): Payer: BLUE CROSS/BLUE SHIELD | Admitting: Family Medicine

## 2016-12-08 DIAGNOSIS — R5383 Other fatigue: Secondary | ICD-10-CM

## 2016-12-08 DIAGNOSIS — F329 Major depressive disorder, single episode, unspecified: Secondary | ICD-10-CM

## 2016-12-08 DIAGNOSIS — K582 Mixed irritable bowel syndrome: Secondary | ICD-10-CM | POA: Diagnosis not present

## 2016-12-08 DIAGNOSIS — F32A Depression, unspecified: Secondary | ICD-10-CM

## 2016-12-08 MED ORDER — HYOSCYAMINE SULFATE 0.125 MG PO TABS: 0.1250 mg | ORAL_TABLET | ORAL | 1 refills | Status: DC | PRN

## 2016-12-08 MED ORDER — PROMETHAZINE HCL 12.5 MG PO TABS: 12.5000 mg | ORAL_TABLET | Freq: Three times a day (TID) | ORAL | 1 refills | Status: DC | PRN

## 2016-12-08 NOTE — Assessment & Plan Note (Addendum)
Patient has continued symptoms most consistent with Irritable Bowel Syndrome. Biopsies from colonoscopy pending. Patient stopped Amitriptyline.  -start trial of Hyoscyamine 0.125 mg Q4 prn -start phenergan to help with nausea

## 2016-12-08 NOTE — Assessment & Plan Note (Signed)
Continued fatigue. Suspect due to recent poor oral intake related to GI symptoms.  -provided phenergan to help with nausea

## 2016-12-08 NOTE — Patient Instructions (Signed)
It was nice to see you today.  I have given you a prescription for Phenergan and Hyoscyamine (take every 4 hours as needed for stomach cramping).

## 2016-12-12 ENCOUNTER — Telehealth: Payer: Self-pay | Admitting: Gastroenterology

## 2016-12-12 NOTE — Telephone Encounter (Signed)
I would ask him to hang in there - I reviewed chart and there are random colon biopsies pending and would like to avoid any other Rx for time being until Dr. Loletha Carrow can see pathology and decide what is next  If he wants can try ondansetron 4 mg q 6 prn # 30 no refill for nausea

## 2016-12-12 NOTE — Telephone Encounter (Signed)
Patient states that ondansetron gives him severe headaches so he will stick with the phenergan. Suggested he continue to eat and drink, break it down into smaller more frequent meals. When the biopsy results are available then we will let him know. Patient did state that he has not yet started the Levsin, he wanted to wait to see what the results showed.

## 2016-12-12 NOTE — Telephone Encounter (Signed)
Patient called today, reports still having continued right sided abdominal pain, with some epigastric pain and decreased appetite, low grade temperature (99.3). Patient had colonoscopy on 1/9 by Dr. Loletha Carrow. Patient continues to take all his regular medications, PCP sent in Rx for phenergan and Levsin on 1/11. Patient states that he has some nausea,denies vomiting, phenergan makes him too sleepy and only took one dose on Friday. Please advise.

## 2016-12-13 ENCOUNTER — Encounter: Payer: Self-pay | Admitting: Gastroenterology

## 2016-12-13 NOTE — Telephone Encounter (Signed)
Patient advised of results. He will start the Levsin. He has appointment on 2/5 to follow up in clinic.

## 2016-12-13 NOTE — Telephone Encounter (Signed)
Random colon biopsies are normal, so no microscopic colitis.  As he and I discussed, this makes me feel even more that he has IBS.  I know he can be sensitive to med side effects, but I would like him to try the levsin.  Then I would like to see him in clinic in a few weeks. Also, his polyp was benign, and will be recalled for colonoscopy in 3 years.  A letter will go to him about that as well.

## 2016-12-19 ENCOUNTER — Other Ambulatory Visit: Payer: Self-pay | Admitting: Family Medicine

## 2016-12-19 ENCOUNTER — Encounter: Payer: Self-pay | Admitting: Family Medicine

## 2016-12-19 DIAGNOSIS — G43009 Migraine without aura, not intractable, without status migrainosus: Secondary | ICD-10-CM

## 2016-12-21 DIAGNOSIS — F5109 Other insomnia not due to a substance or known physiological condition: Secondary | ICD-10-CM | POA: Diagnosis not present

## 2016-12-21 DIAGNOSIS — G4730 Sleep apnea, unspecified: Secondary | ICD-10-CM | POA: Diagnosis not present

## 2016-12-21 DIAGNOSIS — F332 Major depressive disorder, recurrent severe without psychotic features: Secondary | ICD-10-CM | POA: Diagnosis not present

## 2016-12-21 DIAGNOSIS — F411 Generalized anxiety disorder: Secondary | ICD-10-CM | POA: Diagnosis not present

## 2016-12-22 ENCOUNTER — Encounter: Payer: Self-pay | Admitting: Family Medicine

## 2016-12-22 DIAGNOSIS — L0109 Other impetigo: Secondary | ICD-10-CM | POA: Diagnosis not present

## 2016-12-22 DIAGNOSIS — L700 Acne vulgaris: Secondary | ICD-10-CM | POA: Diagnosis not present

## 2016-12-22 DIAGNOSIS — L0889 Other specified local infections of the skin and subcutaneous tissue: Secondary | ICD-10-CM | POA: Diagnosis not present

## 2016-12-22 DIAGNOSIS — L2089 Other atopic dermatitis: Secondary | ICD-10-CM | POA: Diagnosis not present

## 2016-12-26 NOTE — Progress Notes (Signed)
   Subjective:    Patient ID: Douglas Knight, male    DOB: 21-Mar-1986, 31 y.o.   MRN: [id number]  HPI 31 y/o male presents for follow up of IBS.  IBS Recently had colonoscopy with negative biopsies for inflammatory process. Started on Hyoscyamine last visit. Less frequent flare up (avg. 2x per week), no diarrhea, still having intermittent constipation, occasional blood with hard stools  Cat Bite/skin rash 1.5 months ago, felt "buring in ear" about a week ago, culture grew back staph. Placed on Keflex by dermatology, resolving  Acne No longer taking Acutane, follows with dermatology  Social Recently returned to work, had been off for a month (short term disability) due to GI symptoms.    Review of Systems See above    Objective:   Physical Exam BP 134/84   Pulse 73   Temp 98.1 F (36.7 C) (Oral)   Ht 5\' 8"  (1.727 m)   Wt 178 lb 9.6 oz (81 kg)   SpO2 97%   BMI 27.16 kg/m   Gen: pleasant male, NAD Skin: erythematous scaling plagues of bilateral external ear Abd: soft, RUQ tenderness, normal bowel sounds      Assessment & Plan:  Irritable bowel syndrome Improved with Hyoscyamine however now have increased constipation.  -decrease Gyoscyamine to BID and complete symptoms log  Cellulitis Cellulitis of bilateral ears currently treated with Keflex by Dermatology. Improving. -continue current treatment

## 2016-12-26 NOTE — Progress Notes (Signed)
.  kf

## 2016-12-29 ENCOUNTER — Ambulatory Visit (INDEPENDENT_AMBULATORY_CARE_PROVIDER_SITE_OTHER): Payer: BLUE CROSS/BLUE SHIELD | Admitting: Family Medicine

## 2016-12-29 ENCOUNTER — Encounter: Payer: Self-pay | Admitting: Family Medicine

## 2016-12-29 DIAGNOSIS — K582 Mixed irritable bowel syndrome: Secondary | ICD-10-CM

## 2016-12-29 DIAGNOSIS — L03211 Cellulitis of face: Secondary | ICD-10-CM

## 2016-12-29 NOTE — Patient Instructions (Signed)
It was nice to see you today.  Please keep a log of your symptoms. Decrease the Hyoscyamine to twice daily. You may change what times of day you take this to see if this improves your symptoms.  Return in 2-4 weeks. Please message me in MyChart if yo have questions.

## 2016-12-30 DIAGNOSIS — L0390 Cellulitis, unspecified: Secondary | ICD-10-CM | POA: Insufficient documentation

## 2016-12-30 NOTE — Assessment & Plan Note (Signed)
Cellulitis of bilateral ears currently treated with Keflex by Dermatology. Improving. -continue current treatment

## 2016-12-30 NOTE — Assessment & Plan Note (Signed)
Improved with Hyoscyamine however now have increased constipation.  -decrease Gyoscyamine to BID and complete symptoms log

## 2017-01-02 ENCOUNTER — Ambulatory Visit: Payer: BLUE CROSS/BLUE SHIELD | Admitting: Gastroenterology

## 2017-01-09 ENCOUNTER — Ambulatory Visit (INDEPENDENT_AMBULATORY_CARE_PROVIDER_SITE_OTHER): Payer: BLUE CROSS/BLUE SHIELD | Admitting: Gastroenterology

## 2017-01-09 ENCOUNTER — Encounter: Payer: Self-pay | Admitting: Gastroenterology

## 2017-01-09 ENCOUNTER — Other Ambulatory Visit (INDEPENDENT_AMBULATORY_CARE_PROVIDER_SITE_OTHER): Payer: BLUE CROSS/BLUE SHIELD

## 2017-01-09 ENCOUNTER — Other Ambulatory Visit: Payer: Self-pay | Admitting: Family Medicine

## 2017-01-09 VITALS — BP 126/80 | HR 64 | Ht 68.5 in | Wt 174.5 lb

## 2017-01-09 DIAGNOSIS — R634 Abnormal weight loss: Secondary | ICD-10-CM

## 2017-01-09 DIAGNOSIS — R1031 Right lower quadrant pain: Secondary | ICD-10-CM

## 2017-01-09 DIAGNOSIS — K582 Mixed irritable bowel syndrome: Secondary | ICD-10-CM

## 2017-01-09 LAB — COMPREHENSIVE METABOLIC PANEL
ALT: 15 U/L (ref 0–53)
AST: 16 U/L (ref 0–37)
Albumin: 4.8 g/dL (ref 3.5–5.2)
Alkaline Phosphatase: 122 U/L — ABNORMAL HIGH (ref 39–117)
BILIRUBIN TOTAL: 0.6 mg/dL (ref 0.2–1.2)
BUN: 11 mg/dL (ref 6–23)
CALCIUM: 9.3 mg/dL (ref 8.4–10.5)
CHLORIDE: 105 meq/L (ref 96–112)
CO2: 28 meq/L (ref 19–32)
Creatinine, Ser: 1.08 mg/dL (ref 0.40–1.50)
GFR: 84.26 mL/min (ref >60.00)
Glucose, Bld: 96 mg/dL (ref 70–99)
POTASSIUM: 3.5 meq/L (ref 3.5–5.1)
Sodium: 140 mEq/L (ref 135–145)
Total Protein: 7.4 g/dL (ref 6.0–8.3)

## 2017-01-09 NOTE — Patient Instructions (Signed)
If you are age 32 or older, your body mass index should be between 23-30. Your Body mass index is 26.15 kg/m. If this is out of the aforementioned range listed, please consider follow up with your Primary Care Provider.  If you are age 22 or younger, your body mass index should be between 19-25. Your Body mass index is 26.15 kg/m. If this is out of the aformentioned range listed, please consider follow up with your Primary Care Provider.   You have been scheduled for a CT scan of the abdomen and pelvis at [place] ([address]---this is in the same building as Press photographer).   You are scheduled on 01-17-2017 at [facility] should arrive 15 minutes prior to your appointment time for registration. Please follow the written instructions below on the day of your exam:  WARNING: IF YOU ARE ALLERGIC TO IODINE/X-RAY DYE, PLEASE NOTIFY RADIOLOGY IMMEDIATELY AT [phone]! YOU WILL BE GIVEN A 13 HOUR PREMEDICATION PREP.  1) Do not eat anything after 5am (4 hours prior to your test) 2) You have been given 2 bottles of oral contrast to drink. The solution may taste               better if refrigerated, but do NOT add ice or any other liquid to this solution. Shake             well before drinking.    Drink 1 bottle of contrast @ 7am (2 hours prior to your exam)  Drink 1 bottle of contrast @ 8am (1 hour prior to your exam)  You may take any medications as prescribed with a small amount of water except for the following: Metformin, Glucophage, Glucovance, Avandamet, Riomet, Fortamet, Actoplus Met, Janumet, Glumetza or Metaglip. The above medications must be held the day of the exam AND 48 hours after the exam.  The purpose of you drinking the oral contrast is to aid in the visualization of your intestinal tract. The contrast solution may cause some diarrhea. Before your exam is started, you will be given a small amount of fluid to drink. Depending on your individual set of symptoms,  you may also receive an intravenous injection of x-ray contrast/dye. Plan on being at [facility] for 30 minutes or longer, depending on the type of exam you are having performed.  This test typically takes 30-45 minutes to complete.  If you have any questions regarding your exam or if you need to reschedule, you may call the CT department at [phone] between the hours of 8:00 am and 5:00 pm, Monday-Friday.  ________________________________________________________________________  Thank you for choosing [facility]  Dr Wilfrid Lund III

## 2017-01-09 NOTE — Progress Notes (Signed)
[facility] Progress Note  Chief Complaint: Weight loss and abdominal pain  Subjective  History:  Douglas Knight follows up after his recent colonoscopy. He still continues to have right sided abdominal pain more so in the lower quadrant. He no longer has diarrhea, in fact the hyoscyamine seem to precipitate constipation. He states that so far he has lost a total 25 pounds over the last 6 months. He was also having side effects from Accutane, which is since been stopped, and wonders if that could've continued to some of the weight loss. Nevertheless, his energy level is decreased as a result. 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 He is still seeing a psychiatrist and therapist on a regular basis to do with chronic affective issues.  ROS: Cardiovascular:  no chest pain Respiratory: no dyspnea  The patient's Past Medical, Family and Social History were reviewed and are on file in the EMR.  Objective:  Med list reviewed  Vital signs in last 24 hrs: Vitals:   01/09/17 1556  BP: 126/80  Pulse: 64    Physical Exam  He has no muscle wasting Pleasant,  HEENT: sclera anicteric, oral mucosa moist without lesions  Neck: supple, no thyromegaly, JVD or lymphadenopathy  Cardiac: RRR without murmurs, S1S2 heard, no peripheral edema  Pulm: clear to auscultation bilaterally, normal RR and effort noted  Abdomen: soft, mild right sided tenderness, with active bowel sounds. No guarding or palpable hepatosplenomegaly.  Skin; warm and dry, no jaundice or rash  Colonoscopy to the terminal ileum was normal, biopsies negative for microscopic colitis.    @ASSESSMENTPLANBEGIN @ Assessment: Encounter Diagnoses  Name Primary?  . Loss of weight Yes  . RLQ abdominal pain     Overall, he still seems most likely to have a functional bowel disorder. He has had digestive issues going back at least 10 years.  Plan:  CT scan abdomen and pelvis with contrast to be sure he does  not have proximal small bowel Crohn's or other inflammatory process. Stop hyoscyamine for several days. Then he can take it as needed on days with diarrhea that is compromising his quality of life.  Total time 30 minutes, over half spent in counseling and coordination of care.   Nelida Meuse III

## 2017-01-12 ENCOUNTER — Encounter: Payer: Self-pay | Admitting: Family Medicine

## 2017-01-12 ENCOUNTER — Other Ambulatory Visit: Payer: Self-pay | Admitting: Family Medicine

## 2017-01-12 DIAGNOSIS — G43009 Migraine without aura, not intractable, without status migrainosus: Secondary | ICD-10-CM

## 2017-01-17 ENCOUNTER — Inpatient Hospital Stay: Admission: RE | Admit: 2017-01-17 | Payer: BLUE CROSS/BLUE SHIELD | Source: Ambulatory Visit

## 2017-01-23 ENCOUNTER — Ambulatory Visit (INDEPENDENT_AMBULATORY_CARE_PROVIDER_SITE_OTHER)
Admission: RE | Admit: 2017-01-23 | Discharge: 2017-01-23 | Disposition: A | Discharge status: Home / Self Care | Payer: BLUE CROSS/BLUE SHIELD | Source: Ambulatory Visit | Attending: Gastroenterology | Admitting: Gastroenterology

## 2017-01-23 DIAGNOSIS — L700 Acne vulgaris: Secondary | ICD-10-CM | POA: Diagnosis not present

## 2017-01-23 DIAGNOSIS — R1110 Vomiting, unspecified: Secondary | ICD-10-CM | POA: Diagnosis not present

## 2017-01-23 DIAGNOSIS — R634 Abnormal weight loss: Secondary | ICD-10-CM | POA: Diagnosis not present

## 2017-01-23 DIAGNOSIS — R109 Unspecified abdominal pain: Secondary | ICD-10-CM | POA: Diagnosis not present

## 2017-01-23 MED ORDER — IOPAMIDOL (ISOVUE-300) INJECTION 61%
100.0000 mL | Freq: Once | INTRAVENOUS | Status: AC | PRN
  Administered 2017-01-23: 100 mL via INTRAVENOUS

## 2017-01-23 MED ORDER — IOPAMIDOL (ISOVUE-300) INJECTION 61%: 100.0000 mL | Freq: Once | INTRAVENOUS | Status: DC | PRN

## 2017-01-24 NOTE — Progress Notes (Signed)
   Subjective:    Patient ID: Douglas Knight, male    DOB: [birth year], 31 y.o.   MRN: [id number]  HPI 31 y/o male presents for follow up of IBS.  IBS Taking Hyoscyamine once daily prn. Needing a few times a week. No more than once per day.  Seen by GI on 01/09/17. CT abd completed 01/23/17. Has stomach upset due to barium for CT.  Still has occasional flare ups, Having 1-2 BM most days.   Tension HA/Migraine Occasional HA, sometimes Imitrex helps with migraines  HTN Currently on Norvasc 5 mg daily, no side effects, no chest pain.   Anxiety/Fatigue Still has occasional panic attacks Functioning between 65-75% of "normal" Currently seen by psychology and psychiatry  HM Up to date   Review of Systems     Objective:   Physical Exam BP 118/78   Pulse 66   Temp 97.6 F (36.4 C) (Oral)   Ht 5' 8.5" (1.74 m)   Wt 178 lb 6.4 oz (80.9 kg)   SpO2 99%   BMI 26.73 kg/m   Gen: pleasant male, NAD Cardiac: RRR, S1 and S2 present, no murmur Resp: CTAB, normal effort  CT abdomen/pelvis 01/23/17.  IMPRESSION: Mild left renal cortical scarring. Status post appendectomy. No CT findings to account for the patient's chronic right abdominal pain.    Assessment & Plan:  Irritable bowel syndrome Improved with prn Hyoscyamine. Patient understands link between abdominal symptoms and anxiety/depressive symptoms.  -continue current regimen  Essential hypertension Controlled with Norvasc 5 mg daily. -no changes in therapy today  Migraine Stable. Controlled with prn Imitrex  Major depressive disorder, recurrent episode, moderate (HCC) Stable. -continue therapy described by Dr. Albertine Patricia and Ruthann Cancer.   Fatigue Stable. Linked to anxiety/depression. -encouraged continued daily exercise.

## 2017-01-26 ENCOUNTER — Ambulatory Visit (INDEPENDENT_AMBULATORY_CARE_PROVIDER_SITE_OTHER): Payer: BLUE CROSS/BLUE SHIELD | Admitting: Family Medicine

## 2017-01-26 ENCOUNTER — Encounter: Payer: Self-pay | Admitting: Family Medicine

## 2017-01-26 DIAGNOSIS — F329 Major depressive disorder, single episode, unspecified: Secondary | ICD-10-CM | POA: Diagnosis not present

## 2017-01-26 DIAGNOSIS — K582 Mixed irritable bowel syndrome: Secondary | ICD-10-CM | POA: Diagnosis not present

## 2017-01-26 DIAGNOSIS — G43009 Migraine without aura, not intractable, without status migrainosus: Secondary | ICD-10-CM

## 2017-01-26 DIAGNOSIS — R5383 Other fatigue: Secondary | ICD-10-CM | POA: Diagnosis not present

## 2017-01-26 DIAGNOSIS — I10 Essential (primary) hypertension: Secondary | ICD-10-CM | POA: Diagnosis not present

## 2017-01-26 DIAGNOSIS — F331 Major depressive disorder, recurrent, moderate: Secondary | ICD-10-CM | POA: Diagnosis not present

## 2017-01-26 NOTE — Patient Instructions (Signed)
It was nice to see you today.  BP - well controlled  Abdominal Pain - I am glad to hear that it is better.

## 2017-01-27 NOTE — Assessment & Plan Note (Signed)
Controlled with Norvasc 5 mg daily. -no changes in therapy today

## 2017-01-27 NOTE — Assessment & Plan Note (Signed)
Stable. Controlled with prn Imitrex

## 2017-01-27 NOTE — Assessment & Plan Note (Signed)
Improved with prn Hyoscyamine. Patient understands link between abdominal symptoms and anxiety/depressive symptoms.  -continue current regimen

## 2017-01-27 NOTE — Assessment & Plan Note (Signed)
Stable. -continue therapy described by Dr. Albertine Patricia and Ruthann Cancer.

## 2017-01-27 NOTE — Assessment & Plan Note (Signed)
Stable. Linked to anxiety/depression. -encouraged continued daily exercise.

## 2017-01-31 ENCOUNTER — Other Ambulatory Visit: Payer: Self-pay | Admitting: Family Medicine

## 2017-01-31 DIAGNOSIS — K582 Mixed irritable bowel syndrome: Secondary | ICD-10-CM

## 2017-02-03 ENCOUNTER — Ambulatory Visit (HOSPITAL_COMMUNITY)
Admission: RE | Admit: 2017-02-03 | Discharge: 2017-02-03 | Disposition: A | Discharge status: Home / Self Care | Payer: BLUE CROSS/BLUE SHIELD | Source: Ambulatory Visit | Attending: Family Medicine | Admitting: Family Medicine

## 2017-02-03 ENCOUNTER — Ambulatory Visit (INDEPENDENT_AMBULATORY_CARE_PROVIDER_SITE_OTHER): Payer: BLUE CROSS/BLUE SHIELD | Admitting: Obstetrics and Gynecology

## 2017-02-03 VITALS — BP 124/86 | HR 79 | Temp 98.0°F | Wt 178.0 lb

## 2017-02-03 DIAGNOSIS — R93422 Abnormal radiologic findings on diagnostic imaging of left kidney: Secondary | ICD-10-CM | POA: Diagnosis not present

## 2017-02-03 DIAGNOSIS — M549 Dorsalgia, unspecified: Secondary | ICD-10-CM

## 2017-02-03 DIAGNOSIS — R109 Unspecified abdominal pain: Secondary | ICD-10-CM | POA: Diagnosis not present

## 2017-02-03 LAB — POCT URINALYSIS DIPSTICK
Bilirubin, UA: NEGATIVE
GLUCOSE UA: NEGATIVE
Ketones, UA: NEGATIVE
Leukocytes, UA: NEGATIVE
NITRITE UA: NEGATIVE
PH UA: 6
PROTEIN UA: NEGATIVE
RBC UA: NEGATIVE
SPEC GRAV UA: 1.025
UROBILINOGEN UA: 0.2

## 2017-02-03 MED ORDER — HYDROCODONE-ACETAMINOPHEN 5-325 MG PO TABS: 1.0000 | ORAL_TABLET | Freq: Four times a day (QID) | ORAL | 0 refills | Status: AC | PRN

## 2017-02-03 NOTE — Patient Instructions (Signed)

## 2017-02-03 NOTE — Progress Notes (Signed)
Subjective:   Patient ID: Douglas Knight, male    DOB: 01-08-1986, 31 y.o.   MRN: [id number]  Patient presents for Same Day Appointment  Chief Complaint  Patient presents with  . Abdominal Pain    HPI: # Abdominal Pain Patient presenting with 2-3 days of acute abdominal pain. 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 Pain localized to RUQ and radiates to back. Has started to migrate down right side and into groin. States that pain is constant and he is uncomfortable. Unable to sleep at night. Has tried Tylenol and hydrating himself without improvement. PMH significant for IBS. Patient states this feels different than his IBS cramping. Thought it was a possible flare and started taking his hyoscyamine. Doesn't use this medication daily because it causes constipation. Last BM was 3-4 days ago. No known history of kidney stones.   Prior abdominal surgeries: appendectomy   Sexually active with same partner for quite some time.   Symptoms Nausea/vomiting: nausea, no vomiting, dry heaving Anorexia: yes Diarrhea: no Constipation: more so Fever: no Dysuria: no  Of note patient has been followed by GI and had extensive workup that has been unremarkable thus far.  Review of Systems   See HPI for ROS.   History  Smoking Status  . Never Smoker  Smokeless Tobacco  . Never Used    Past medical history, surgical, family, and social history reviewed and updated in the EMR as appropriate.  Pertinent Historical Findings include: Anxiety/depression Objective:  BP 124/86   Pulse 79   Temp 98 F (36.7 C) (Oral)   Wt 178 lb (80.7 kg)   BMI 26.67 kg/m  Vitals and nursing note reviewed  Physical Exam  Constitutional: He appears distressed.  HENT:  Mouth/Throat: Oropharynx is clear and moist.  Cardiovascular: Normal rate and regular rhythm.   Pulmonary/Chest: Effort normal and breath sounds normal.  Abdominal: Soft.  Normal appearance and bowel sounds are normal. There is tenderness in the right upper quadrant and right lower quadrant. There is CVA tenderness.  Voluntary guarding. R. CVA tenderness  Psychiatric: Mood and affect normal.    Results for orders placed or performed in visit on 02/03/17 (from the past 48 hour(s))  POCT urinalysis dipstick     Status: Normal   Collection Time: 02/03/17 10:50 AM  Result Value Ref Range   Color, UA yellow    Clarity, UA clear    Glucose, UA negative    Bilirubin, UA negative    Ketones, UA negative    Spec Grav, UA 1.025    Blood, UA negative    pH, UA 6.0    Protein, UA negative    Urobilinogen, UA 0.2    Nitrite, UA negative    Leukocytes, UA Negative Negative    Assessment & Plan:  1. Abdominal pain, unspecified abdominal location Acute onset abdominal pain that patient is rating severe. He appears in distress. Vitals are stable and he is afebrile. No concern for acute abdomen. No red flags. UA unremarkable. Concern for renal stone in setting of CVA tenderness. Patient with extensive work-up for chronic recurring abdominal pain thus far has been unremarkable. Symptoms have been consistent with functional bowel disorder. Had a CT abdomen pelvis about 2 weeks ago that was unremarkable for any acute process; gallbladder, pancreas, and kidney unremarkable. Discussed with PCP and we agree to proceed with CT abdomen to r/o renal stone. Also could be  due to constipation. Encouraged patient to start bowel regimen. Rx for short course of pain medication given.  - POCT urinalysis dipstick - CT ABDOMEN PELVIS WO CONTRAST; Future  2. CVA tenderness - CT ABDOMEN PELVIS WO CONTRAST; Future  Diagnosis and plan along with any newly prescribed medication(s) were discussed in detail with this patient today. The patient verbalized understanding and agreed with the plan. Patient advised if symptoms worsen return to clinic or ER.   PATIENT EDUCATION PROVIDED: See AVS    Discussed case with patient's PCP  Luiz Blare, DO 02/03/2017, 3:15 PM PGY-3, [place]

## 2017-02-04 ENCOUNTER — Emergency Department (HOSPITAL_COMMUNITY): Payer: BLUE CROSS/BLUE SHIELD

## 2017-02-04 ENCOUNTER — Encounter (HOSPITAL_COMMUNITY): Payer: Self-pay | Admitting: Nurse Practitioner

## 2017-02-04 ENCOUNTER — Telehealth: Payer: Self-pay | Admitting: Student

## 2017-02-04 ENCOUNTER — Emergency Department (HOSPITAL_COMMUNITY)
Admission: EM | Admit: 2017-02-04 | Discharge: 2017-02-04 | Disposition: A | Discharge status: Home / Self Care | Payer: BLUE CROSS/BLUE SHIELD | Attending: Emergency Medicine | Admitting: Emergency Medicine

## 2017-02-04 DIAGNOSIS — N50811 Right testicular pain: Secondary | ICD-10-CM | POA: Diagnosis not present

## 2017-02-04 DIAGNOSIS — R110 Nausea: Secondary | ICD-10-CM

## 2017-02-04 DIAGNOSIS — R1011 Right upper quadrant pain: Secondary | ICD-10-CM

## 2017-02-04 DIAGNOSIS — R1032 Left lower quadrant pain: Secondary | ICD-10-CM | POA: Insufficient documentation

## 2017-02-04 DIAGNOSIS — I10 Essential (primary) hypertension: Secondary | ICD-10-CM | POA: Diagnosis not present

## 2017-02-04 HISTORY — DX: Irritable bowel syndrome, unspecified: K58.9

## 2017-02-04 LAB — URINALYSIS, ROUTINE W REFLEX MICROSCOPIC
BILIRUBIN URINE: NEGATIVE
Glucose, UA: NEGATIVE mg/dL
HGB URINE DIPSTICK: NEGATIVE
KETONES UR: NEGATIVE mg/dL
Leukocytes, UA: NEGATIVE
NITRITE: NEGATIVE
Protein, ur: NEGATIVE mg/dL
SPECIFIC GRAVITY, URINE: 1.023 (ref 1.005–1.030)
pH: 5 (ref 5.0–8.0)

## 2017-02-04 LAB — CBC
HCT: 44.9 % (ref 39.0–52.0)
HEMOGLOBIN: 15.5 g/dL (ref 13.0–17.0)
MCH: 31.8 pg (ref 26.0–34.0)
MCHC: 34.5 g/dL (ref 30.0–36.0)
MCV: 92 fL (ref 78.0–100.0)
PLATELETS: 125 10*3/uL — AB (ref 150–400)
RBC: 4.88 MIL/uL (ref 4.22–5.81)
RDW: 12.1 % (ref 11.5–15.5)
WBC: 5.2 10*3/uL (ref 4.0–10.5)

## 2017-02-04 LAB — COMPREHENSIVE METABOLIC PANEL
ALBUMIN: 4.3 g/dL (ref 3.5–5.0)
ALT: 29 U/L (ref 17–63)
AST: 24 U/L (ref 15–41)
Alkaline Phosphatase: 108 U/L (ref 38–126)
Anion gap: 7 (ref 5–15)
BILIRUBIN TOTAL: 0.7 mg/dL (ref 0.3–1.2)
BUN: 14 mg/dL (ref 6–20)
CALCIUM: 8.9 mg/dL (ref 8.9–10.3)
CO2: 28 mmol/L (ref 22–32)
CREATININE: 1.3 mg/dL — AB (ref 0.61–1.24)
Chloride: 102 mmol/L (ref 101–111)
GFR calc Af Amer: >60 mL/min (ref >60)
GFR calc non Af Amer: >60 mL/min (ref >60)
GLUCOSE: 93 mg/dL (ref 65–99)
Potassium: 4.1 mmol/L (ref 3.5–5.1)
SODIUM: 137 mmol/L (ref 135–145)
Total Protein: 7.1 g/dL (ref 6.5–8.1)

## 2017-02-04 LAB — LIPASE, BLOOD: Lipase: 12 U/L (ref 11–51)

## 2017-02-04 MED ORDER — SODIUM CHLORIDE 0.9 % IV BOLUS (SEPSIS)
1000.0000 mL | Freq: Once | INTRAVENOUS | Status: AC
  Administered 2017-02-04: 1000 mL via INTRAVENOUS

## 2017-02-04 MED ORDER — OMEPRAZOLE 20 MG PO CPDR: 40.0000 mg | DELAYED_RELEASE_CAPSULE | Freq: Every day | ORAL | 0 refills | Status: DC

## 2017-02-04 MED ORDER — MORPHINE SULFATE (PF) 4 MG/ML IV SOLN
4.0000 mg | Freq: Once | INTRAVENOUS | Status: AC
Start: 2017-02-04 — End: 2017-02-04
  Administered 2017-02-04: 4 mg via INTRAVENOUS
  Filled 2017-02-04: qty 1

## 2017-02-04 MED ORDER — GI COCKTAIL ~~LOC~~
30.0000 mL | Freq: Once | ORAL | Status: AC
  Administered 2017-02-04: 30 mL via ORAL
  Filled 2017-02-04: qty 30

## 2017-02-04 MED ORDER — ONDANSETRON HCL 4 MG/2ML IJ SOLN
4.0000 mg | Freq: Once | INTRAMUSCULAR | Status: AC
  Administered 2017-02-04: 4 mg via INTRAVENOUS
  Filled 2017-02-04: qty 2

## 2017-02-04 MED ORDER — HYOSCYAMINE SULFATE 0.125 MG PO TABS: 0.2500 mg | ORAL_TABLET | ORAL | 0 refills | Status: DC | PRN

## 2017-02-04 NOTE — ED Notes (Signed)
I reminded pt, about needing urine sample. Pt. Unable to urinate at this moment, urinal at bedside

## 2017-02-04 NOTE — Discharge Instructions (Addendum)
Increased levasin. Started acid reducer. Start taking miralax for constipation. Follow up with GI in the office. Have kidney function rechecked and primary care office this week.

## 2017-02-04 NOTE — ED Provider Notes (Signed)
[facility] DEPT Provider Note   CSN: [id number] Arrival date & time: 02/04/17  1259     History   Chief Complaint Chief Complaint  Patient presents with  . Abdominal Pain    HPI Douglas Knight is a 31 y.o. male.  HPI  31 year old Caucasian male with a past medical history significant for anal fissures, hemorrhoids, IBS presents to the ED today with complaints of right upper quadrant abdominal pain. Patient states that his pain started approximately 2 days ago. The pain radiates to his right flank and down to his right groin. Also endorses right-sided testicular pain. Patient was recently diagnosed with IBS last month. Is followed by [facility]. Has been on Levsin for pain. States this feels different than his IBS cramping. He thought it was a flare but has not gone away. States his last bowel movement was 2 days ago. Endorses diarrhea but this is baseline per patient. Denies any melena but states that he does have some bright red blood but has a history of hemorrhoids and he does have small amount of bleeding with his IBS. Patient states that he saw his primary doctor yesterday who gave him pain medicine in the office and sent him for an outpatient abdominal CT. Patient states the pain is sharp and constant. He reports nausea, dry heaves, urinary hesitancy, constipation. Primary care doctor gave him Norco to take at home for pain with little relief. Abdominal CAT scan showed no acute abnormalities. Spoke to the on-call provider for the practice to give him results and told to come to the ED if his pain is severe and is not improved pain medication. Denies any fevers, chills, headache, vision changes Past Medical History:  Diagnosis Date  . Anal fissure   . Anxiety   . Depression   . Hypertension   . IBS (irritable bowel syndrome)   . ITP (idiopathic thrombocytopenic purpura)     Patient Active Problem List   Diagnosis Date Noted  . Cellulitis 12/30/2016  . Colon  polyp 12/07/2016  . Irritable bowel syndrome 09/02/2016  . External hemorrhoid 09/02/2016  . Rectal fissure 09/02/2016  . Allergic rhinitis 04/29/2016  . Migraine 04/29/2016  . Vitamin D deficiency 11/17/2015  . Insomnia 07/27/2015  . Acne 03/09/2015  . Essential hypertension 02/27/2015  . Elevated serum creatinine 12/31/2014  . Fatigue 12/29/2014  . Preventative health care 12/29/2014  . Major depressive disorder, recurrent episode, moderate ([place]) 12/29/2014  . Chronic ITP (idiopathic thrombocytopenia) ([facility]) 12/29/2014    Past Surgical History:  Procedure Laterality Date  . APPENDECTOMY  2003   [place] Medications    Prior to Admission medications   Medication Sig Start Date End Date Taking? Authorizing Provider  ALPRAZolam Duanne Moron) 1 MG tablet Take 1 tablet (1 mg total) by mouth 2 (two) times daily as needed for anxiety. 09/02/16   Lupita Dawn, MD  amLODipine (NORVASC) 5 MG tablet Take 5 mg by mouth daily.    Historical Provider, MD  dextromethorphan-guaiFENesin (MUCINEX DM) 30-600 MG 12hr tablet Take 1 tablet by mouth 2 (two) times daily. 05/20/16   Asiyah Cletis Media, MD  docusate sodium (COLACE) 100 MG capsule Take 1 capsule (100 mg total) by mouth 2 (two) times daily. 10/27/16   Lupita Dawn, MD  gabapentin (NEURONTIN) 100 MG capsule Take 2 capsules (200 mg total) by mouth 4 (four) times daily. Take one pill three times a day.  If drowsiness is an issue -  you can take it all in the evening. 09/02/16 10/03/16  Lupita Dawn, MD  HYDROcodone-acetaminophen (NORCO/VICODIN) 5-325 MG tablet Take 1 tablet by mouth every 6 (six) hours as needed for moderate pain. 02/03/17 02/10/17  Katheren Shams, DO  hyoscyamine (LEVSIN, ANASPAZ) 0.125 MG tablet TAKE 1 TABLET BY MOUTH EVERY 4 HOURS AS NEEDED 01/31/17   Lupita Dawn, MD  promethazine (PHENERGAN) 12.5 MG tablet TAKE 1 TABLET BY MOUTH EVERY 8 HOURS AS NEEDED FOR FOR NAUSEA AND VOMITING 01/09/17   Lupita Dawn, MD  SUMAtriptan (IMITREX) 25 MG tablet Take 1 tablet (25 mg total) by mouth every 2 (two) hours as needed for migraine. May repeat in 2 hours if headache persists or recurs. 09/02/16   Lupita Dawn, MD  zolpidem (AMBIEN CR) 12.5 MG CR tablet Take 12.5 mg by mouth Nightly. 04/04/16   Historical Provider, MD    Family History Family History  Problem Relation Age of Onset  . Diabetes Father   . Hypertension Father   . Colon polyps Father   . Heart disease Maternal Grandfather   . Alzheimer's disease Paternal Grandmother   . Depression Paternal Grandfather   . Colon polyps Paternal Grandfather   . Colon cancer Paternal Aunt     Social History Social History  Substance Use Topics  . Smoking status: Never Smoker  . Smokeless tobacco: Never Used  . Alcohol use 0.0 oz/week     Comment: rarely     Allergies   Sulfa antibiotics and Wellbutrin [bupropion]   Review of Systems Review of Systems  Constitutional: Negative for chills and fever.  HENT: Negative for congestion.   Eyes: Negative for visual disturbance.  Respiratory: Negative for cough and shortness of breath.   Cardiovascular: Negative for chest pain.  Gastrointestinal: Positive for abdominal pain (ruq), blood in stool (baseline), constipation, diarrhea and nausea. Negative for vomiting.  Genitourinary: Positive for difficulty urinating and testicular pain. Negative for discharge, dysuria, frequency, hematuria, scrotal swelling and urgency.  Musculoskeletal: Negative for back pain.  Skin: Negative.   Neurological: Negative for dizziness, syncope, weakness, light-headedness, numbness and headaches.  All other systems reviewed and are negative.    Physical Exam Updated Vital Signs BP 119/88   Pulse 74   Temp 98.2 F (36.8 C) (Oral)   Resp 22   SpO2 96%   Physical Exam  Constitutional: He appears well-developed and well-nourished. No distress.  Patient appears uncomfortable in bed due to pain. He is  nontoxic appearing.  HENT:  Head: Normocephalic and atraumatic.  Mouth/Throat: Oropharynx is clear and moist.  Eyes: Conjunctivae are normal. Right eye exhibits no discharge. Left eye exhibits no discharge. No scleral icterus.  Neck: Normal range of motion. Neck supple. No thyromegaly present.  Cardiovascular: Normal rate, regular rhythm, normal heart sounds and intact distal pulses.   Pulmonary/Chest: Effort normal and breath sounds normal.  Abdominal: Soft. Bowel sounds are normal. He exhibits no distension. There is tenderness in the right upper quadrant and right lower quadrant. There is positive Murphy's sign. There is no rigidity, no rebound, no guarding, no CVA tenderness and no tenderness at McBurney's point. Hernia confirmed negative in the right inguinal area and confirmed negative in the left inguinal area.  Abdomen is not rigid. No signs of peritonitis. Abdomen is soft. Bowel sounds normal.  Genitourinary: Penis normal. Cremasteric reflex is present. Right testis shows tenderness. Right testis shows no mass and no swelling. Right testis is descended. Left testis shows no mass,  no swelling and no tenderness. Left testis is descended. Uncircumcised. No penile tenderness. No discharge found.  Genitourinary Comments: Chaperone present for exam and pt tolerated without difficutly.   Musculoskeletal: Normal range of motion.  Lymphadenopathy:    He has no cervical adenopathy. No inguinal adenopathy noted on the right or left side.  Neurological: He is alert.  Skin: Skin is warm and dry.  Nursing note and vitals reviewed.    ED Treatments / Results  Labs (all labs ordered are listed, but only abnormal results are displayed) Labs Reviewed  COMPREHENSIVE METABOLIC PANEL - Abnormal; Notable for the following:       Result Value   Creatinine, Ser 1.30 (*)    All other components within normal limits  CBC - Abnormal; Notable for the following:    Platelets 125 (*)    All other  components within normal limits  LIPASE, BLOOD  URINALYSIS, ROUTINE W REFLEX MICROSCOPIC    EKG  EKG Interpretation None       Radiology Ct Abdomen Pelvis Wo Contrast  Result Date: 02/03/2017 CLINICAL DATA:  Abdominal cramping and right flank pain radiating to the back EXAM: CT ABDOMEN AND PELVIS WITHOUT CONTRAST TECHNIQUE: Multidetector CT imaging of the abdomen and pelvis was performed following the standard protocol without IV contrast. COMPARISON:  01/23/2017 FINDINGS: Lower chest: No acute infiltrate or effusion.  Normal heart size. Hepatobiliary: No focal liver abnormality is seen. No gallstones, gallbladder wall thickening, or biliary dilatation. Pancreas: Unremarkable. No pancreatic ductal dilatation or surrounding inflammatory changes. Spleen: Normal in size without focal abnormality. Adrenals/Urinary Tract: Adrenal glands are within normal limits. No hydronephrosis, calcification or ureteral stone. Atrophic left kidney. Bladder normal. Stomach/Bowel: Stomach is within normal limits. Status post appendectomy. No evidence of bowel wall thickening, distention, or inflammatory changes. Vascular/Lymphatic: No significant vascular findings are present. No enlarged abdominal or pelvic lymph nodes. Reproductive: Prostate is unremarkable. Other: Tiny fat containing umbilical hernia. No abdominopelvic ascites. Musculoskeletal: No acute or significant osseous findings. IMPRESSION: 1. Negative for nephrolithiasis, hydronephrosis or ureteral stone 2. Scarred atrophic appearing left kidney, unchanged 3. Status post appendectomy 4. There are no acute abnormalities visualized. Electronically Signed   By: Donavan Foil M.D.   On: 02/03/2017 18:32   US Abdomen Complete  Result Date: 02/04/2017 CLINICAL DATA:  Right upper quadrant pain EXAM: ABDOMEN ULTRASOUND COMPLETE COMPARISON:  02/03/2017 FINDINGS: Gallbladder: No gallstones or wall thickening visualized. No sonographic Murphy sign noted by sonographer.  Common bile duct: Diameter: 3 mm Liver: No focal lesion identified. Within normal limits in parenchymal echogenicity. IVC: Not well visualized Pancreas: Not well visualized due to overlying bowel gas. Spleen: Size and appearance within normal limits. Right Kidney: Length: 10.6 cm. Echogenicity within normal limits. No mass or hydronephrosis visualized. Left Kidney: Length: 7.9 cm. Echogenicity within normal limits. No mass or hydronephrosis visualized. Abdominal aorta: No aneurysm visualized. Other findings: None. IMPRESSION: No acute abnormality noted. The left kidney is somewhat smaller than the right this is similar to that seen on prior CT. Electronically Signed   By: Inez Catalina M.D.   On: 02/04/2017 15:59   US Scrotum  Result Date: 02/04/2017 CLINICAL DATA:  Right testicular pain EXAM: SCROTAL ULTRASOUND DOPPLER ULTRASOUND OF THE TESTICLES TECHNIQUE: Complete ultrasound examination of the testicles, epididymis, and other scrotal structures was performed. Color and spectral Doppler ultrasound were also utilized to evaluate blood flow to the testicles. COMPARISON:  None. FINDINGS: Right testicle Measurements: 4.7 x 2.2 x 2.8 cm. No mass or microlithiasis  visualized. Left testicle Measurements: 4.5 x 2.1 x 3.1 cm. No mass or microlithiasis visualized. Right epididymis: 2 x 2 x 4 mm anechoic right epididymal mass most consistent with a small epididymal cyst. Otherwise normal in size and appearance. Left epididymis:  Normal in size and appearance. Hydrocele:  None visualized. Varicocele:  None visualized. Pulsed Doppler interrogation of both testes demonstrates normal low resistance arterial and venous waveforms bilaterally. IMPRESSION: 1. No testicular torsion. 2. No testicular mass. Electronically Signed   By: Kathreen Devoid   On: 02/04/2017 15:53   [place] Art/ven Flow Abd Pelv Doppler  Result Date: 02/04/2017 CLINICAL DATA:  Right testicular pain EXAM: SCROTAL ULTRASOUND DOPPLER ULTRASOUND OF THE TESTICLES  TECHNIQUE: Complete ultrasound examination of the testicles, epididymis, and other scrotal structures was performed. Color and spectral Doppler ultrasound were also utilized to evaluate blood flow to the testicles. COMPARISON:  None. FINDINGS: Right testicle Measurements: 4.7 x 2.2 x 2.8 cm. No mass or microlithiasis visualized. Left testicle Measurements: 4.5 x 2.1 x 3.1 cm. No mass or microlithiasis visualized. Right epididymis: 2 x 2 x 4 mm anechoic right epididymal mass most consistent with a small epididymal cyst. Otherwise normal in size and appearance. Left epididymis:  Normal in size and appearance. Hydrocele:  None visualized. Varicocele:  None visualized. Pulsed Doppler interrogation of both testes demonstrates normal low resistance arterial and venous waveforms bilaterally. IMPRESSION: 1. No testicular torsion. 2. No testicular mass. Electronically Signed   By: Kathreen Devoid   On: 02/04/2017 15:53    Procedures Procedures (including critical care time)  Medications Ordered in ED Medications  sodium chloride 0.9 % bolus 1,000 mL (0 mLs Intravenous Stopped 02/04/17 1905)  morphine 4 MG/ML injection 4 mg (4 mg Intravenous Given 02/04/17 1444)  ondansetron (ZOFRAN) injection 4 mg (4 mg Intravenous Given 02/04/17 1444)  gi cocktail (Maalox,Lidocaine,Donnatal) (30 mLs Oral Given 02/04/17 1615)     Initial Impression / Assessment and Plan / ED Course  I have reviewed the triage vital signs and the nursing notes.  Pertinent labs & imaging results that were available during my care of the patient were reviewed by me and considered in my medical decision making (see chart for details).    Pt presents to the Ed with complaint of RUQ pain. Pt with hx of ibs. Was seen by pcp yesterday and ordered outpatient ct of abd that showed no abnormalities. Pain persisted. RUQ pain. No signs of peritonitis. Abd soft and bowel sounds normal.  Labs in Ed today was normal including leukocytosis, lipase,  electrolytes. Pt does high slightly increased creatine but likely secondary to dehydration. Given fluid bolus. abd [place] ordered. PT does have right testicular pain. [place] ordered. US showed no acute abnormalities. Spoke with Dr. Duanne Guess with [facility] concerning pt.  He recommends increasing patient Levasin to max dose, starting patient on PPI (priolsec 40 mg QD), starting patient on MiraLAX. Pain could likely be due to constipation or pud. Will have pt follow up in office this week. Continue current pain meds at home. Pt is passing gas. Low suspicion for bowel obstruction. Patient is nontoxic, nonseptic appearing, in no apparent distress.  Patient's pain and other symptoms adequately managed in emergency department.  Fluid bolus given.  Labs, imaging and vitals reviewed.  Pt with normal Ct scan yesterday and normal [place] today. Patient does not meet the SIRS or Sepsis criteria.  On repeat exam patient does not have a surgical abdomin and there are no peritoneal signs.  No indication  of appendicitis, bowel obstruction, bowel perforation, cholecystitis, diverticulitis.  Pt is afebrile and not tachycardic. Waiting for ua results. Encouraged pt to follow up with pcp this week to have kidney function rechecked. Follow up with GI. If ua without signs of infection and pain is managed can be discharge. Pending ua results. Hand off to [place].   Final Clinical Impressions(s) / ED Diagnoses   Final diagnoses:  RUQ pain  Right testicular pain  Nausea    New Prescriptions Discharge Medication List as of 02/04/2017  6:55 PM    START taking these medications   Details  !! hyoscyamine (LEVSIN) 0.125 MG tablet Take 2 tablets (0.25 mg total) by mouth every 4 (four) hours as needed., Starting Sat 02/04/2017, Print    omeprazole (PRILOSEC) 20 MG capsule Take 2 capsules (40 mg total) by mouth daily., Starting Sat 02/04/2017, Print     !! - Potential duplicate medications found. Please discuss with provider.         [name], PA-C 02/04/17 2109    Quintella Reichert, MD 02/05/17 [phone]

## 2017-02-04 NOTE — ED Triage Notes (Signed)
Pt presents with c/o abdominal pain. The began on Wednesday. The pain is in his RUQ radiating into R flank and R groin. The pain has been constant since it began. He reports nausea, dry heaves, urinary hesitancy, constiaption. He denies fevers. He had a ct scan ordered by his primary doctor at [facility] last night and was told it was normal. He has been taking norco for the pain with no relief.

## 2017-02-04 NOTE — Telephone Encounter (Signed)
Called patient in response to the page received on the after-hours pager. He reports that he was seen in clinic yesterday for abdominal pain in RLQ and right lower back pain. He states that he had a CT abdomen ordered and was told someone would call him with the results but he hasn't heard from anyone yet. He reports taking pain medication few minutes ago. He rates his pain as 7 out of 10. Pain is intermittent. Denies fever or emesis. Reports nausea. LBM about 2 days ago. Denies dysuria or hematuria. I discussed CT results with the patient which is negative. I recommended going to ED if pain is severe and doesn't improve with the pain medication he just took. Patient voices understanding and agrees.

## 2017-02-06 ENCOUNTER — Telehealth: Payer: Self-pay

## 2017-02-06 NOTE — Telephone Encounter (Signed)
Left message for patient to call our office to schedule a follow up either with Dr. Loletha Carrow or one of the APP's.

## 2017-02-06 NOTE — Telephone Encounter (Signed)
-----   Message from Manus Gunning, MD sent at 02/05/2017 12:56 PM EDT ----- Douglas Knight,  This is one of Dr. Corena Pilgrim patients who was seen in the ED this weekend, they were requesting follow up with him in the clinic or one of the APPs in the next few weeks. Thanks  ----- Message ----- From: [name], PA-C Sent: 02/04/2017   9:13 PM To: Manus Gunning, MD  Hi Dr. Havery Moros,   This is the pt that I talked about with you this afternoon. You told me to send you a message to give you their name for follow up in the clinic this week.   Thanks, [employer]

## 2017-02-09 ENCOUNTER — Other Ambulatory Visit: Payer: Self-pay | Admitting: Family Medicine

## 2017-02-09 DIAGNOSIS — G43009 Migraine without aura, not intractable, without status migrainosus: Secondary | ICD-10-CM

## 2017-02-22 ENCOUNTER — Ambulatory Visit (INDEPENDENT_AMBULATORY_CARE_PROVIDER_SITE_OTHER): Payer: BLUE CROSS/BLUE SHIELD | Admitting: Gastroenterology

## 2017-02-22 ENCOUNTER — Encounter: Payer: Self-pay | Admitting: Gastroenterology

## 2017-02-22 VITALS — BP 130/90 | HR 100 | Ht 68.5 in | Wt 182.2 lb

## 2017-02-22 DIAGNOSIS — K582 Mixed irritable bowel syndrome: Secondary | ICD-10-CM | POA: Diagnosis not present

## 2017-02-22 DIAGNOSIS — R1031 Right lower quadrant pain: Secondary | ICD-10-CM | POA: Diagnosis not present

## 2017-02-22 MED ORDER — TRAMADOL HCL 50 MG PO TABS: 50.0000 mg | ORAL_TABLET | Freq: Two times a day (BID) | ORAL | 1 refills | Status: DC | PRN

## 2017-02-22 NOTE — Patient Instructions (Addendum)
If you are age 32 or older, your body mass index should be between 23-30. Your Body mass index is 27.3 kg/m. If this is out of the aforementioned range listed, please consider follow up with your Primary Care Provider.  If you are age 26 or younger, your body mass index should be between 19-25. Your Body mass index is 27.3 kg/m. If this is out of the aformentioned range listed, please consider follow up with your Primary Care Provider.   Thank you for choosing [facility]  Dr Wilfrid Lund III   Food Guidelines for a sensitive stomach  Many people have difficulty digesting certain foods, causing a variety of distressing and embarrassing symptoms such as abdominal pain, bloating and gas.  These foods may need to be avoided or consumed in small amounts.  Here are some tips that might be helpful for you.  1.   Lactose intolerance is the difficulty or complete inability to digest lactose, the natural sugar in milk and anything made from milk.  This condition is harmless, common, and can begin any time during life.  Some people can digest a modest amount of lactose while others cannot tolerate any.  Also, not all dairy products contain equal amounts of lactose.  For example, hard cheeses such as parmesan have less lactose than soft cheeses such as cheddar.  Yogurt has less lactose than milk or cheese.  Many packaged foods (even many brands of bread) have milk, so read ingredient lists carefully.  It is difficult to test for lactose intolerance, so just try avoiding lactose as much as possible for a week and see what happens with your symptoms.  If you seem to be lactose intolerant, the best plan is to avoid it (but make sure you get calcium from another source).  The next best thing is to use lactase enzyme supplements, available over the counter everywhere.  Just know that many lactose intolerant people need to take several tablets with each serving of dairy to avoid symptoms.  Lastly, a lot of restaurant  food is made with milk or butter.  Many are things you might not suspect, such as mashed potatoes, rice and pasta (cooked with butter) and "grilled" items.  If you are lactose intolerant, it never hurts to ask your server what has milk or butter.  2.   Fiber is an important part of your diet, but not all fiber is well-tolerated.  Insoluble fiber such as bran is often consumed by normal gut bacteria and converted into gas.  Soluble fiber such as oats, squash, carrots and green beans are typically tolerated better.  3.   Some types of carbohydrates can be poorly digested.  Examples include: fructose (apples, cherries, pears, raisins and other dried fruits), fructans (onions, zucchini, large amounts of wheat), sorbitol/mannitol/xylitol and sucralose/Splenda (common artificial sweeteners), and raffinose (lentils, broccoli, cabbage, asparagus, brussel sprouts, many types of beans).  Do a Development worker, community for [employer] and you will find helpful information. Beano, a dietary supplement, will often help with raffinose-containing foods.  As with lactase tablets, you may need several per serving.  4.   Whenever possible, avoid processed food&meats and chemical additives.  High fructose corn syrup, a common sweetener, may be difficult to digest.  Eggs and soy (comes from the soybean, and added to many foods now) are the other most common bloating/gassy foods.  - Dr. [facility]

## 2017-02-22 NOTE — Progress Notes (Signed)
[facility] Progress Note  Chief Complaint: Right-sided abdominal pain  Subjective  History:  Douglas Knight follows up for his abdominal pain with diarrhea and IBS. He had an episode of worsened crampy right-sided abdominal pain radiating up to the right upper quadrant and around to the back within the last month. He went to primary care, they sent him for CT scan, and he went to the ED later that night. It was a noncontrast scan out of concern for possible renal stone. It was normal, and he had just had a contrast CT scan with me prior because of his ongoing symptoms and weight loss. That too was a normal study. He still has the same symptoms as before with bloating, occasional diarrhea and crampy abdominal pain. He is very sensitive to hyoscyamine, and he took it regularly even at low dose it caused constipation. As before, he has ongoing affective issues about which he works closely with his counselor and psychiatrist. 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 However, on a few occasions, including the day he went to the ED, he had to miss a few hours of work due to pain.  ROS: Cardiovascular:  no chest pain Respiratory: no dyspnea  The patient's Past Medical, Family and Social History were reviewed and are on file in the EMR.  Objective:  Med list reviewed  Vital signs in last 24 hrs: Vitals:   02/22/17 1609  BP: 130/90  Pulse: 100    Physical Exam  Pleasant anxious appearing young man, no muscle wasting  HEENT: sclera anicteric, oral mucosa moist without lesions  Neck: supple, no thyromegaly, JVD or lymphadenopathy  Cardiac: RRR without murmurs, S1S2 heard, no peripheral edema  Pulm: clear to auscultation bilaterally, normal RR and effort noted  Abdomen: soft, mild right sided tenderness, with active bowel sounds. No guarding or palpable hepatosplenomegaly.  Skin; warm and dry, no jaundice or rash  Recent Labs:  CBC Latest Ref Rng &  Units 02/04/2017 12/22/2015 02/01/2015  WBC 4.0 - 10.5 K/uL 5.2 6.3 8.5  Hemoglobin 13.0 - 17.0 g/dL 15.5 15.1 14.4  Hematocrit 39.0 - 52.0 % 44.9 42.2 39.4  Platelets 150 - 400 K/uL 125(L) 119(L) 143(L)   CMP Latest Ref Rng & Units 02/04/2017 01/09/2017 01/13/2016  Glucose 65 - 99 mg/dL 93 96 95  BUN 6 - 20 mg/dL 14 11 15   Creatinine 0.61 - 1.24 mg/dL 1.30(H) 1.08 1.12  Sodium 135 - 145 mmol/L 137 140 140  Potassium 3.5 - 5.1 mmol/L 4.1 3.5 4.5  Chloride 101 - 111 mmol/L 102 105 104  CO2 22 - 32 mmol/L 28 28 28   Calcium 8.9 - 10.3 mg/dL 8.9 9.3 9.7  Total Protein 6.5 - 8.1 g/dL 7.1 7.4 7.2  Total Bilirubin 0.3 - 1.2 mg/dL 0.7 0.6 0.4  Alkaline Phos 38 - 126 U/L 108 122(H) 91  AST 15 - 41 U/L 24 16 24   ALT 17 - 63 U/L 29 15 42     Radiologic studies:  nml [place], nML NCCTAP  @ASSESSMENTPLANBEGIN @ Assessment: Encounter Diagnoses  Name Primary?  . RLQ abdominal pain Yes  . Irritable bowel syndrome with both constipation and diarrhea     We had another discussion about the nature of his condition limited available treatments. He is or knowledgeable about at this point. He understands how much of this is related to his underlying psychiatric issues. He has realistic expectations about what can be done about this. I offered him  a trial of tramadol use as a "rescue medicine" that might keep him from missing any work should've flare of this problem occur. He also might try half dose hyoscyamine 1 or 2 times a day, since he seems to be sensitive to its side effects.  Plan:  Tramadol 50 mg, 1 twice a day as needed, 12 tablets and refill given. See me as needed  Total time 30 minutes, over half spent in counseling and coordination of care.   Nelida Meuse III

## 2017-03-12 ENCOUNTER — Other Ambulatory Visit: Payer: Self-pay | Admitting: Family Medicine

## 2017-03-12 DIAGNOSIS — G43009 Migraine without aura, not intractable, without status migrainosus: Secondary | ICD-10-CM

## 2017-04-05 ENCOUNTER — Other Ambulatory Visit: Payer: Self-pay | Admitting: Gastroenterology

## 2017-04-05 NOTE — Telephone Encounter (Signed)
Pt requesting refills of Tramadol 50 mg. Last seen on 02-22-17

## 2017-04-12 DIAGNOSIS — F411 Generalized anxiety disorder: Secondary | ICD-10-CM | POA: Diagnosis not present

## 2017-04-12 DIAGNOSIS — G4730 Sleep apnea, unspecified: Secondary | ICD-10-CM | POA: Diagnosis not present

## 2017-04-12 DIAGNOSIS — F332 Major depressive disorder, recurrent severe without psychotic features: Secondary | ICD-10-CM | POA: Diagnosis not present

## 2017-04-12 DIAGNOSIS — F5109 Other insomnia not due to a substance or known physiological condition: Secondary | ICD-10-CM | POA: Diagnosis not present

## 2017-04-14 ENCOUNTER — Encounter: Payer: Self-pay | Admitting: Family Medicine

## 2017-04-18 ENCOUNTER — Ambulatory Visit: Payer: BLUE CROSS/BLUE SHIELD | Admitting: Family Medicine

## 2017-04-18 NOTE — Progress Notes (Deleted)
   Subjective:   Douglas Knight is a 31 y.o. male with a history of *** here for ***  ***  Review of Systems:  Per HPI.   Social History: *** smoker  Objective:  There were no vitals taken for this visit.  Gen:  31 y.o. male in NAD *** HEENT: NCAT, MMM, EOMI, PERRL, anicteric sclerae CV: RRR, no MRG, no JVD Resp: Non-labored, CTAB, no wheezes noted Abd: Soft, NTND, BS present, no guarding or organomegaly Ext: WWP, no edema MSK: Full ROM, strength intact Neuro: Alert and oriented, speech normal       Chemistry      Component Value Date/Time   NA 137 02/04/2017 1318   NA 141 04/02/2014   K 4.1 02/04/2017 1318   CL 102 02/04/2017 1318   CO2 28 02/04/2017 1318   BUN 14 02/04/2017 1318   BUN 16 04/02/2014   CREATININE 1.30 (H) 02/04/2017 1318   CREATININE 1.12 01/13/2016 0931   GLU 98 04/02/2014      Component Value Date/Time   CALCIUM 8.9 02/04/2017 1318   ALKPHOS 108 02/04/2017 1318   AST 24 02/04/2017 1318   ALT 29 02/04/2017 1318   BILITOT 0.7 02/04/2017 1318      Lab Results  Component Value Date   WBC 5.2 02/04/2017   HGB 15.5 02/04/2017   HCT 44.9 02/04/2017   MCV 92.0 02/04/2017   PLT 125 (L) 02/04/2017   Lab Results  Component Value Date   TSH 1.37 01/13/2016   No results found for: HGBA1C Assessment & Plan:     Douglas Knight is a 31 y.o. male here for ***  No problem-specific Assessment & Plan notes found for this encounter.    Virginia Crews, MD MPH PGY-3,  [facility] 04/18/2017  2:50 PM

## 2017-04-20 DIAGNOSIS — F332 Major depressive disorder, recurrent severe without psychotic features: Secondary | ICD-10-CM | POA: Diagnosis not present

## 2017-04-20 DIAGNOSIS — F411 Generalized anxiety disorder: Secondary | ICD-10-CM | POA: Diagnosis not present

## 2017-04-21 DIAGNOSIS — F411 Generalized anxiety disorder: Secondary | ICD-10-CM | POA: Diagnosis not present

## 2017-04-21 DIAGNOSIS — F332 Major depressive disorder, recurrent severe without psychotic features: Secondary | ICD-10-CM | POA: Diagnosis not present

## 2017-04-25 DIAGNOSIS — F332 Major depressive disorder, recurrent severe without psychotic features: Secondary | ICD-10-CM | POA: Diagnosis not present

## 2017-04-27 DIAGNOSIS — F332 Major depressive disorder, recurrent severe without psychotic features: Secondary | ICD-10-CM | POA: Diagnosis not present

## 2017-04-27 DIAGNOSIS — F411 Generalized anxiety disorder: Secondary | ICD-10-CM | POA: Diagnosis not present

## 2017-05-01 DIAGNOSIS — F411 Generalized anxiety disorder: Secondary | ICD-10-CM | POA: Diagnosis not present

## 2017-05-01 DIAGNOSIS — F332 Major depressive disorder, recurrent severe without psychotic features: Secondary | ICD-10-CM | POA: Diagnosis not present

## 2017-05-02 DIAGNOSIS — F332 Major depressive disorder, recurrent severe without psychotic features: Secondary | ICD-10-CM | POA: Diagnosis not present

## 2017-05-02 DIAGNOSIS — F411 Generalized anxiety disorder: Secondary | ICD-10-CM | POA: Diagnosis not present

## 2017-05-03 DIAGNOSIS — F332 Major depressive disorder, recurrent severe without psychotic features: Secondary | ICD-10-CM | POA: Diagnosis not present

## 2017-05-03 DIAGNOSIS — F411 Generalized anxiety disorder: Secondary | ICD-10-CM | POA: Diagnosis not present

## 2017-05-08 DIAGNOSIS — F411 Generalized anxiety disorder: Secondary | ICD-10-CM | POA: Diagnosis not present

## 2017-05-08 DIAGNOSIS — F332 Major depressive disorder, recurrent severe without psychotic features: Secondary | ICD-10-CM | POA: Diagnosis not present

## 2017-05-10 ENCOUNTER — Telehealth: Payer: Self-pay | Admitting: Internal Medicine

## 2017-05-10 ENCOUNTER — Emergency Department (HOSPITAL_COMMUNITY)
Admission: EM | Admit: 2017-05-10 | Discharge: 2017-05-10 | Disposition: A | Discharge status: Home / Self Care | Payer: BLUE CROSS/BLUE SHIELD | Attending: Emergency Medicine | Admitting: Emergency Medicine

## 2017-05-10 ENCOUNTER — Ambulatory Visit (INDEPENDENT_AMBULATORY_CARE_PROVIDER_SITE_OTHER): Payer: BLUE CROSS/BLUE SHIELD | Admitting: Internal Medicine

## 2017-05-10 ENCOUNTER — Encounter (HOSPITAL_COMMUNITY): Payer: Self-pay | Admitting: *Deleted

## 2017-05-10 ENCOUNTER — Other Ambulatory Visit: Payer: Self-pay

## 2017-05-10 VITALS — BP 151/105 | HR 83 | Temp 97.6°F | Ht 68.5 in | Wt 179.4 lb

## 2017-05-10 DIAGNOSIS — R519 Headache, unspecified: Secondary | ICD-10-CM

## 2017-05-10 DIAGNOSIS — I159 Secondary hypertension, unspecified: Secondary | ICD-10-CM

## 2017-05-10 DIAGNOSIS — I10 Essential (primary) hypertension: Secondary | ICD-10-CM | POA: Diagnosis not present

## 2017-05-10 DIAGNOSIS — Z79899 Other long term (current) drug therapy: Secondary | ICD-10-CM | POA: Diagnosis not present

## 2017-05-10 DIAGNOSIS — Z7902 Long term (current) use of antithrombotics/antiplatelets: Secondary | ICD-10-CM | POA: Diagnosis not present

## 2017-05-10 DIAGNOSIS — R531 Weakness: Secondary | ICD-10-CM | POA: Diagnosis not present

## 2017-05-10 DIAGNOSIS — R51 Headache: Secondary | ICD-10-CM | POA: Insufficient documentation

## 2017-05-10 DIAGNOSIS — R5383 Other fatigue: Secondary | ICD-10-CM | POA: Diagnosis not present

## 2017-05-10 LAB — BASIC METABOLIC PANEL
ANION GAP: 9 (ref 5–15)
BUN: 13 mg/dL (ref 6–20)
CALCIUM: 9.3 mg/dL (ref 8.9–10.3)
CO2: 24 mmol/L (ref 22–32)
CREATININE: 1.25 mg/dL — AB (ref 0.61–1.24)
Chloride: 106 mmol/L (ref 101–111)
GFR calc Af Amer: >60 mL/min (ref >60)
GLUCOSE: 94 mg/dL (ref 65–99)
Potassium: 3.8 mmol/L (ref 3.5–5.1)
Sodium: 139 mmol/L (ref 135–145)

## 2017-05-10 LAB — CBC
HCT: 45 % (ref 39.0–52.0)
Hemoglobin: 15.8 g/dL (ref 13.0–17.0)
MCH: 31.9 pg (ref 26.0–34.0)
MCHC: 35.1 g/dL (ref 30.0–36.0)
MCV: 90.7 fL (ref 78.0–100.0)
Platelets: 134 10*3/uL — ABNORMAL LOW (ref 150–400)
RBC: 4.96 MIL/uL (ref 4.22–5.81)
RDW: 12.5 % (ref 11.5–15.5)
WBC: 5.6 10*3/uL (ref 4.0–10.5)

## 2017-05-10 LAB — URINALYSIS, ROUTINE W REFLEX MICROSCOPIC
Bilirubin Urine: NEGATIVE
Glucose, UA: NEGATIVE mg/dL
HGB URINE DIPSTICK: NEGATIVE
Ketones, ur: NEGATIVE mg/dL
Leukocytes, UA: NEGATIVE
NITRITE: NEGATIVE
PROTEIN: NEGATIVE mg/dL
SPECIFIC GRAVITY, URINE: 1.024 (ref 1.005–1.030)
pH: 5 (ref 5.0–8.0)

## 2017-05-10 MED ORDER — KETOROLAC TROMETHAMINE 30 MG/ML IJ SOLN
30.0000 mg | Freq: Once | INTRAMUSCULAR | Status: AC
  Administered 2017-05-10: 30 mg via INTRAVENOUS
  Filled 2017-05-10: qty 1

## 2017-05-10 MED ORDER — METOCLOPRAMIDE HCL 5 MG/ML IJ SOLN
10.0000 mg | Freq: Once | INTRAMUSCULAR | Status: AC
  Administered 2017-05-10: 10 mg via INTRAVENOUS
  Filled 2017-05-10: qty 2

## 2017-05-10 MED ORDER — SODIUM CHLORIDE 0.9 % IV BOLUS (SEPSIS)
1000.0000 mL | Freq: Once | INTRAVENOUS | Status: AC
  Administered 2017-05-10: 1000 mL via INTRAVENOUS

## 2017-05-10 MED ORDER — DIPHENHYDRAMINE HCL 50 MG/ML IJ SOLN
25.0000 mg | Freq: Once | INTRAMUSCULAR | Status: AC
  Administered 2017-05-10: 25 mg via INTRAVENOUS
  Filled 2017-05-10: qty 1

## 2017-05-10 NOTE — Telephone Encounter (Signed)
[facility] After Hours Telephone Line   Patient calling out of concern for high blood pressure. Has been measuring throughout the afternoon and has been increasing throughout the day. Highest 184/104. Endorses headaches but says these occur frequently and current HA is not worse than typical HAs. Denies changes in vision. Denies nausea or vomiting. Denies numbness, weakness, or tingling. Is prescribed amlodipine and took dose today as prescribed. Discussed with patient that as he does not have any red flags, he does not need to come to ED tonight, and instead can be seen either for same day appt tomorrow or for BP check in clinic at the least. Discussed red flags and reasons to come to ED. Patient voiced understanding and appreciation.   Adin Hector, MD, MPH PGY-2 [facility] Pager [phone]

## 2017-05-10 NOTE — ED Provider Notes (Signed)
[facility] DEPT Provider Note   CSN: [id number] Arrival date & time: 05/10/17  1148     History   Chief Complaint Chief Complaint  Patient presents with  . Weakness  . Hypertension    HPI Douglas Knight is a 31 y.o. male.  HPI  31 y.o. male with a hx of Anxiety, HTN, presents to the Emergency Department today due to generalized fatigue, weakness that started yesterday. Seen by PCP today and sent to ED for further evaluation. Per patient, noted BP readings 170Y systolic recently. Notes compliance with Amlodipine 5mg . Pt had to leave work early today due to fatigue. States that his limbs feel heavy x 4. Notes headache that is frontal. Hx migraines. No visual changes. No numbness/tingling. No fever or chills. Rates headache 8/10. Throbbing sensation. No meds PTA. Of note, Pt started Transcranial Magnetic Sstimulation x 2 weeks ago. No other symptoms noted.   Past Medical History:  Diagnosis Date  . Anal fissure   . Anxiety   . Depression   . Hypertension   . IBS (irritable bowel syndrome)   . ITP (idiopathic thrombocytopenic purpura)     Patient Active Problem List   Diagnosis Date Noted  . Cellulitis 12/30/2016  . Colon polyp 12/07/2016  . Irritable bowel syndrome 09/02/2016  . External hemorrhoid 09/02/2016  . Rectal fissure 09/02/2016  . Allergic rhinitis 04/29/2016  . Migraine 04/29/2016  . Vitamin D deficiency 11/17/2015  . Insomnia 07/27/2015  . Acne 03/09/2015  . Essential hypertension 02/27/2015  . Elevated serum creatinine 12/31/2014  . Fatigue 12/29/2014  . Preventative health care 12/29/2014  . Major depressive disorder, recurrent episode, moderate ([place]) 12/29/2014  . Chronic ITP (idiopathic thrombocytopenia) (HCC) 12/29/2014    Past Surgical History:  Procedure Laterality Date  . APPENDECTOMY  2003   [facility] Medications    Prior to Admission medications   Medication Sig Start Date End Date Taking?  Authorizing Provider  ALPRAZolam Duanne Moron) 1 MG tablet Take 1 tablet (1 mg total) by mouth 2 (two) times daily as needed for anxiety. 09/02/16   Lupita Dawn, MD  amLODipine (NORVASC) 5 MG tablet Take 5 mg by mouth daily.    [provider]  docusate sodium (COLACE) 100 MG capsule Take 1 capsule (100 mg total) by mouth 2 (two) times daily. 10/27/16   Lupita Dawn, MD  gabapentin (NEURONTIN) 100 MG capsule Take 2 capsules (200 mg total) by mouth 4 (four) times daily. Take one pill three times a day.  If drowsiness is an issue - you can take it all in the evening. Patient taking differently: Take 200-800 mg by mouth 4 (four) times daily. Take one pill three times a day.  If drowsiness is an issue - you can take it all in the evening. 200 mg 3 time daily and 800 mg at bedtime 09/02/16 02/04/17  Lupita Dawn, MD  hyoscyamine (LEVSIN, ANASPAZ) 0.125 MG tablet TAKE 1 TABLET BY MOUTH EVERY 4 HOURS AS NEEDED 01/31/17   Lupita Dawn, MD  omeprazole (PRILOSEC) 20 MG capsule Take 2 capsules (40 mg total) by mouth daily. 02/04/17   [name], PA-C  promethazine (PHENERGAN) 12.5 MG tablet TAKE 1 TABLET BY MOUTH EVERY 8 HOURS AS NEEDED FOR FOR NAUSEA AND VOMITING 01/09/17   Lupita Dawn, MD  SUMAtriptan (IMITREX) 25 MG tablet Take 1 tablet (25 mg total) by mouth every 2 (two) hours as needed for migraine.  May repeat in 2 hours if headache persists or recurs. 09/02/16   Lupita Dawn, MD  traMADol (ULTRAM) 50 MG tablet TAKE 1 TABLET BY MOUTH EVERY TWELVE HOURS AS NEEDED 04/05/17   Doran Stabler, MD  zolpidem (AMBIEN CR) 12.5 MG CR tablet Take 12.5 mg by mouth Nightly. 04/04/16   [provider]    Family History Family History  Problem Relation Age of Onset  . Diabetes Father   . Hypertension Father   . Colon polyps Father   . Heart disease Maternal Grandfather   . Alzheimer's disease Paternal Grandmother   . Depression Paternal Grandfather   . Colon polyps Paternal Grandfather    . Colon cancer Paternal Aunt     Social History Social History  Substance Use Topics  . Smoking status: Never Smoker  . Smokeless tobacco: Never Used  . Alcohol use 0.0 oz/week     Comment: rarely     Allergies   Sulfa antibiotics and Wellbutrin [bupropion]   Review of Systems Review of Systems ROS reviewed and all are negative for acute change except as noted in the HPI.  Physical Exam Updated Vital Signs BP (!) 161/111 (BP Location: Left Arm)   Pulse 77   Temp 98.6 F (37 C) (Oral)   Resp 20   SpO2 95%   Physical Exam  Constitutional: He is oriented to person, place, and time. Vital signs are normal. He appears well-developed and well-nourished.  HENT:  Head: Normocephalic and atraumatic.  Right Ear: Hearing normal.  Left Ear: Hearing normal.  Eyes: Conjunctivae and EOM are normal. Pupils are equal, round, and reactive to light.  Neck: Normal range of motion. Neck supple.  Cardiovascular: Normal rate, regular rhythm, normal heart sounds and intact distal pulses.   Pulmonary/Chest: Effort normal and breath sounds normal.  Abdominal: Soft. There is no tenderness.  Musculoskeletal: Normal range of motion.  Neurological: He is alert and oriented to person, place, and time. He has normal strength. No cranial nerve deficit or sensory deficit.  Cranial Nerves:  II: Pupils equal, round, reactive to light III,IV, VI: ptosis not present, extra-ocular motions intact bilaterally  V,VII: smile symmetric, facial light touch sensation equal VIII: hearing grossly normal bilaterally  IX,X: midline uvula rise  XI: bilateral shoulder shrug equal and strong XII: midline tongue extension  Skin: Skin is warm and dry.  Psychiatric: He has a normal mood and affect. His speech is normal and behavior is normal. Thought content normal.  Nursing note and vitals reviewed.    ED Treatments / Results  Labs (all labs ordered are listed, but only abnormal results are displayed) Labs  Reviewed  BASIC METABOLIC PANEL - Abnormal; Notable for the following:       Result Value   Creatinine, Ser 1.25 (*)    All other components within normal limits  CBC - Abnormal; Notable for the following:    Platelets 134 (*)    All other components within normal limits  URINALYSIS, ROUTINE W REFLEX MICROSCOPIC    EKG  EKG Interpretation None       Radiology No results found.  Procedures Procedures (including critical care time)  Medications Ordered in ED Medications  sodium chloride 0.9 % bolus 1,000 mL (1,000 mLs Intravenous New Bag/Given 05/10/17 1448)  metoCLOPramide (REGLAN) injection 10 mg (10 mg Intravenous Given 05/10/17 1449)  diphenhydrAMINE (BENADRYL) injection 25 mg (25 mg Intravenous Given 05/10/17 1449)  ketorolac (TORADOL) 30 MG/ML injection 30 mg (30 mg Intravenous Given  05/10/17 1449)    Initial Impression / Assessment and Plan / ED Course  I have reviewed the triage vital signs and the nursing notes.  Pertinent labs & imaging results that were available during my care of the patient were reviewed by me and considered in my medical decision making (see chart for details).  Final Clinical Impressions(s) / ED Diagnoses  {I have reviewed and evaluated the relevant laboratory values.   {I have reviewed the relevant previous healthcare records.  {I obtained HPI from historian.   ED Course:  Assessment: Pt is a 31 y.o. male with hx  Anxiety, HTN who presents with hypertension and generalized weakness x 1 day. Seen by PCP this AM and sent to ED for further evaluation. Notes headache. No N/V. No vision changes. On exam, pt in NAD. Nontoxic/nonseptic appearing. VSS. Afebrile. Lungs CTA. Heart RRR. Abdomen nontender soft. CN evaluated and unremarkable. Labs raessuring. Given fluids, Reglan, benadryl and toradol in ED with improvement. Suspect 2/2 Bent treatment. Doubt acute neurologic emergency. Relief of headache with migraine cocktail. Plan is to DC home with follow up  to PCP. BP improved in ED without intervention. Likely 2/2 pain with headache. At time of discharge, Patient is in no acute distress. Vital Signs are stable. Patient is able to ambulate. Patient able to tolerate PO.   Disposition/Plan:  DC Home Additional Verbal discharge instructions given and discussed with patient.  Pt Instructed to f/u with PCP in the next week for evaluation and treatment of symptoms. Return precautions given Pt acknowledges and agrees with plan  Supervising Physician Davonna Belling, MD  Final diagnoses:  Nonintractable headache, unspecified chronicity pattern, unspecified headache type  Generalized weakness    New Prescriptions New Prescriptions   No medications on file     Shary Decamp, Hershal Coria 05/10/17 1627    Davonna Belling, MD 05/11/17 1454

## 2017-05-10 NOTE — ED Triage Notes (Signed)
Pt reports having generalized fatigue and weakness since yesterday. Reports high bp since yesterday. No neuro deficits are noted at triage. Reports feeling "heavy" all over.

## 2017-05-10 NOTE — Discharge Instructions (Addendum)
Please read and follow all provided instructions.  Your diagnoses today include:  1. Nonintractable headache, unspecified chronicity pattern, unspecified headache type   2. Generalized weakness     Tests performed today include: Vital signs. See below for your results today.   Medications prescribed:  Take as prescribed   Home care instructions:  Follow any educational materials contained in this packet.  Follow-up instructions: Please follow-up with your primary care provider for further evaluation of symptoms and treatment   Return instructions:  Please return to the Emergency Department if you do not get better, if you get worse, or new symptoms OR  - Fever (temperature greater than 101.17F)  - Bleeding that does not stop with holding pressure to the area    -Severe pain (please note that you may be more sore the day after your accident)  - Chest Pain  - Difficulty breathing  - Severe nausea or vomiting  - Inability to tolerate food and liquids  - Passing out  - Skin becoming red around your wounds  - Change in mental status (confusion or lethargy)  - New numbness or weakness    Please return if you have any other emergent concerns.  Additional Information:  Your vital signs today were: BP (!) 144/99    Pulse 74    Temp 98.6 F (37 C) (Oral)    Resp (!) 9    SpO2 100%  If your blood pressure (BP) was elevated above 135/85 this visit, please have this repeated by your doctor within one month. ---------------

## 2017-05-10 NOTE — Progress Notes (Signed)
Subjective:    Douglas Knight - 31 y.o. male MRN [id number]  Date of birth: Mar 01, 1986  HPI  Douglas Knight is here for SDA for elevated BP. Patient reports that BP has been elevated for the past day, average readings 180/110s. Reports compliance with home Amlodipine 5 mg. Had to leave work yesterday due to significant fatigue. Denies focal weakness but reports that all of his limbs feel heavy. He endorses confusion with trouble gathering his thoughts. Reports he has had trouble findings his words and that his speech has been slurred. Endorses headaches but says these occur frequently and current HA is not worse than typical HAs. Denies changes in vision. Has a history of IBS but reports no significant difference in his current symptoms. Has been tolerating some PO intake. Denies numbness or tingling. No fevers or chills at home. No new changes in his home medications. Did start Transcranial Magnetic Stimulation about 2 weeks ago. He called the office where this was done and they did not believe this would account for his symptoms.    -  reports that he has never smoked. He has never used smokeless tobacco. - Review of Systems: Per HPI. - Past Medical History: Patient Active Problem List   Diagnosis Date Noted  . Cellulitis 12/30/2016  . Colon polyp 12/07/2016  . Irritable bowel syndrome 09/02/2016  . External hemorrhoid 09/02/2016  . Rectal fissure 09/02/2016  . Allergic rhinitis 04/29/2016  . Migraine 04/29/2016  . Vitamin D deficiency 11/17/2015  . Insomnia 07/27/2015  . Acne 03/09/2015  . Essential hypertension 02/27/2015  . Elevated serum creatinine 12/31/2014  . Fatigue 12/29/2014  . Preventative health care 12/29/2014  . Major depressive disorder, recurrent episode, moderate ([place]) 12/29/2014  . Chronic ITP (idiopathic thrombocytopenia) ([facility]) 12/29/2014   - Medications: reviewed and updated   Objective:   Physical Exam BP (!) 151/105 (BP Location: Left Arm, Patient  Position: Sitting, Cuff Size: Normal)   Pulse 83   Temp 97.6 F (36.4 C) (Oral)   Ht 5' 8.5" (1.74 m)   Wt 179 lb 6.4 oz (81.4 kg)   SpO2 100%   BMI 26.88 kg/m  Gen: cooperative with exam, appears tired but non-toxic  HEENT: NCAT, PERRL, clear conjunctiva, oropharynx clear, supple neck CV: RRR, good S1/S2, no murmur, no edema, capillary refill brisk  Resp: CTABL, no wheezes, non-labored Abd: soft, diffusely TTP (unchanged from baseline), BS present, no guarding or organomegaly Neuro: Speech slowed but clear. Alert and oriented x3. Strength 5/5 in all extremities. CN II-XII grossly intact. Sensation to extremities grossly intact. Gait slowed but normal.     Assessment & Plan:   1. Elevated blood pressure reading in office with diagnosis of hypertension 2. Other fatigue  31 y.o. male with PMH of IBS,HTN and depression presenting for elevated blood pressure and fatigue. Diastolic BP elevated with minimal elevation in systolic BP. These numbers are lower than reported home BP readings. Patient appears fatigued in the office and reports systemic sensation of weakness. Neuro exam is non-focal. Concerned that patient may be dehydrated especially in the setting of his significant history of IBS and could benefit from IVFs. Would also recommend checking BMET to ensure electrolytes stable and that kidney function has not suffered in the setting of high BPs. In office, BP reading is not alarmingly high but patient would likely benefit from serial monitoring of BPs. CVA remains on differential given report of slurred speech/slowed speech/confusion with history of transiently elevated BP; however, given non-focal neuro exam  and lack of significant risk factors for CVA at his age lower suspicion for stroke at present. Have transported patient to ER for further management and evaluation. Patient seen by PCP and attending Dr. Ree Kida who agrees with this plan. Patient may benefit from dose increase in Amlodipine  or addition of second anti-hypertensive agent.     Phill Myron, D.O. 05/10/2017, 11:48 AM PGY-2, [facility]

## 2017-05-12 ENCOUNTER — Encounter: Payer: Self-pay | Admitting: Family Medicine

## 2017-05-12 ENCOUNTER — Telehealth: Payer: Self-pay | Admitting: Internal Medicine

## 2017-05-12 NOTE — Telephone Encounter (Signed)
**  After Hours/ Emergency Line Call*  Received a call to from Douglas Knight. - he reports that he has been having bad HA since Tuesday. He also notices some mild confusion at times such as him having to take more time to gather his thoughts. He was reported he had some general weakness on Wednesday. He was seen in the ED and was given IVF which helped with this. He was given medications which helped for a few hours but his HA returned. He also reports of having some discomfort across his shoulder and upper chest. No palpitations/anxiety. No shortness of breath. Reports of mild nausea but no vomiting.  Has personal and FH of HTN. Family history of maternal GF with MI at age 67. Recommended evaluation in the ED. Patient agreed.  Will forward to PCP.  Smiley Houseman, MD PGY-2, [facility]

## 2017-05-13 ENCOUNTER — Encounter (HOSPITAL_COMMUNITY): Payer: Self-pay

## 2017-05-13 ENCOUNTER — Ambulatory Visit (HOSPITAL_COMMUNITY)
Admission: EM | Admit: 2017-05-13 | Discharge: 2017-05-13 | Disposition: A | Discharge status: Home / Self Care | Payer: BLUE CROSS/BLUE SHIELD | Attending: Family Medicine | Admitting: Family Medicine

## 2017-05-13 ENCOUNTER — Emergency Department (HOSPITAL_COMMUNITY)
Admission: EM | Admit: 2017-05-13 | Discharge: 2017-05-13 | Disposition: A | Discharge status: Home / Self Care | Payer: BLUE CROSS/BLUE SHIELD | Attending: Emergency Medicine | Admitting: Emergency Medicine

## 2017-05-13 DIAGNOSIS — R51 Headache: Secondary | ICD-10-CM | POA: Diagnosis not present

## 2017-05-13 DIAGNOSIS — I10 Essential (primary) hypertension: Secondary | ICD-10-CM | POA: Insufficient documentation

## 2017-05-13 DIAGNOSIS — Z79899 Other long term (current) drug therapy: Secondary | ICD-10-CM | POA: Insufficient documentation

## 2017-05-13 DIAGNOSIS — R519 Headache, unspecified: Secondary | ICD-10-CM

## 2017-05-13 LAB — CBC WITH DIFFERENTIAL/PLATELET
Basophils Absolute: 0 10*3/uL (ref 0.0–0.1)
Basophils Relative: 0 %
EOS ABS: 0.2 10*3/uL (ref 0.0–0.7)
EOS PCT: 2 %
HCT: 42.3 % (ref 39.0–52.0)
Hemoglobin: 15.1 g/dL (ref 13.0–17.0)
LYMPHS ABS: 1.2 10*3/uL (ref 0.7–4.0)
LYMPHS PCT: 14 %
MCH: 31.8 pg (ref 26.0–34.0)
MCHC: 35.7 g/dL (ref 30.0–36.0)
MCV: 89.1 fL (ref 78.0–100.0)
MONO ABS: 0.5 10*3/uL (ref 0.1–1.0)
Monocytes Relative: 6 %
Neutro Abs: 6.9 10*3/uL (ref 1.7–7.7)
Neutrophils Relative %: 78 %
PLATELETS: 107 10*3/uL — AB (ref 150–400)
RBC: 4.75 MIL/uL (ref 4.22–5.81)
RDW: 12.2 % (ref 11.5–15.5)
WBC: 8.8 10*3/uL (ref 4.0–10.5)

## 2017-05-13 LAB — URINALYSIS, ROUTINE W REFLEX MICROSCOPIC
BILIRUBIN URINE: NEGATIVE
GLUCOSE, UA: NEGATIVE mg/dL
HGB URINE DIPSTICK: NEGATIVE
KETONES UR: NEGATIVE mg/dL
Leukocytes, UA: NEGATIVE
Nitrite: NEGATIVE
PROTEIN: NEGATIVE mg/dL
Specific Gravity, Urine: 1.023 (ref 1.005–1.030)
pH: 5 (ref 5.0–8.0)

## 2017-05-13 LAB — COMPREHENSIVE METABOLIC PANEL
ALBUMIN: 4.7 g/dL (ref 3.5–5.0)
ALT: 25 U/L (ref 17–63)
AST: 23 U/L (ref 15–41)
Alkaline Phosphatase: 104 U/L (ref 38–126)
Anion gap: 11 (ref 5–15)
BILIRUBIN TOTAL: 0.8 mg/dL (ref 0.3–1.2)
BUN: 9 mg/dL (ref 6–20)
CHLORIDE: 101 mmol/L (ref 101–111)
CO2: 25 mmol/L (ref 22–32)
CREATININE: 1.22 mg/dL (ref 0.61–1.24)
Calcium: 9.3 mg/dL (ref 8.9–10.3)
GFR calc Af Amer: >60 mL/min (ref >60)
GFR calc non Af Amer: >60 mL/min (ref >60)
GLUCOSE: 95 mg/dL (ref 65–99)
POTASSIUM: 3.6 mmol/L (ref 3.5–5.1)
Sodium: 137 mmol/L (ref 135–145)
Total Protein: 7.5 g/dL (ref 6.5–8.1)

## 2017-05-13 MED ORDER — CLONIDINE HCL 0.1 MG PO TABS
ORAL_TABLET | ORAL | Status: AC
  Filled 2017-05-13: qty 1

## 2017-05-13 MED ORDER — HYDROCHLOROTHIAZIDE 12.5 MG PO CAPS: 12.5000 mg | ORAL_CAPSULE | Freq: Every day | ORAL | 0 refills | Status: DC

## 2017-05-13 MED ORDER — CLONIDINE HCL 0.1 MG PO TABS
0.1000 mg | ORAL_TABLET | Freq: Once | ORAL | Status: AC
  Administered 2017-05-13: 0.1 mg via ORAL

## 2017-05-13 MED ORDER — KETOROLAC TROMETHAMINE 30 MG/ML IJ SOLN
30.0000 mg | Freq: Once | INTRAMUSCULAR | Status: AC
  Administered 2017-05-13: 30 mg via INTRAVENOUS
  Filled 2017-05-13: qty 1

## 2017-05-13 MED ORDER — BUTALBITAL-APAP-CAFF-COD 50-325-40-30 MG PO CAPS: ORAL_CAPSULE | ORAL | 0 refills | Status: DC

## 2017-05-13 MED ORDER — DIPHENHYDRAMINE HCL 50 MG/ML IJ SOLN
25.0000 mg | Freq: Once | INTRAMUSCULAR | Status: AC
  Administered 2017-05-13: 25 mg via INTRAVENOUS
  Filled 2017-05-13: qty 1

## 2017-05-13 MED ORDER — PROCHLORPERAZINE EDISYLATE 5 MG/ML IJ SOLN
5.0000 mg | Freq: Once | INTRAMUSCULAR | Status: AC
  Administered 2017-05-13: 5 mg via INTRAVENOUS
  Filled 2017-05-13: qty 2

## 2017-05-13 NOTE — ED Provider Notes (Signed)
CSN: [id number]     Arrival date & time 05/13/17  1248 History   First MD Initiated Contact with Patient 05/13/17 1412     Chief Complaint  Patient presents with  . 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 Patient was feeling fatigue on Tuesday which he contributes the fatigue to work-related but later noticed his BP was high. He went to see PCP on Wednesday for his BP to be managed but his PCP send him to ER for blood work. In the ER, patient reports that he was treated for migraine but was never given anything for his blood pressure and was discharged home.  He continues to feel tired, reports his blood pressure remains elevated, he also has headache that has returned associated with nausea, photosensitivity and some lightheadedness upon standing up. He does have hx of migraine but reports that this headache doesn't feel like his typical migraine. He describes his headache is not anything different than his headache 3 days ago in the ER. He denies visual disturbances or focal weakness.   He has appt with PCP on Monday.         Past Medical History:  Diagnosis Date  . Anal fissure   . Anxiety   . Depression   . Hypertension   . IBS (irritable bowel syndrome)   . ITP (idiopathic thrombocytopenic purpura)    Past Surgical History:  Procedure Laterality Date  . APPENDECTOMY  2003   [facility]   Family History  Problem Relation Age of Onset  . Diabetes Father   . Hypertension Father   . Colon polyps Father   . Heart disease Maternal Grandfather   . Alzheimer's disease Paternal Grandmother   . Depression Paternal Grandfather   . Colon polyps Paternal Grandfather   . Colon cancer Paternal Aunt    Social History  Substance Use Topics  . Smoking status: Never Smoker  . Smokeless  tobacco: Never Used  . Alcohol use 0.0 oz/week     Comment: rarely    Review of Systems  Constitutional:       As stated in the HPI    Allergies  Sulfa antibiotics and Wellbutrin [bupropion]  Home Medications   Prior to Admission medications   Medication Sig Start Date End Date Taking? Authorizing Provider  ALPRAZolam Duanne Moron) 1 MG tablet Take 1 tablet (1 mg total) by mouth 2 (two) times daily as needed for anxiety. 09/02/16  Yes Lupita Dawn, MD  amLODipine (NORVASC) 5 MG tablet Take 5 mg by mouth daily.   Yes [provider]  docusate sodium (COLACE) 100 MG capsule Take 1 capsule (100 mg total) by mouth 2 (two) times daily. 10/27/16  Yes Lupita Dawn, MD  gabapentin (NEURONTIN) 100 MG capsule Take 200 mg by mouth 3 (three) times daily. 03/28/17  Yes [provider]  gabapentin (NEURONTIN) 800 MG tablet Take 800 mg by mouth at bedtime. 04/17/17  Yes [provider]  hyoscyamine (LEVSIN, ANASPAZ) 0.125 MG tablet TAKE 1 TABLET BY MOUTH EVERY 4 HOURS AS NEEDED 01/31/17  Yes Lupita Dawn, MD  omeprazole (PRILOSEC) 20 MG capsule Take 2 capsules (40 mg total) by mouth daily. Patient taking differently: Take 40 mg by mouth daily as needed (reflux).  02/04/17  Yes Leaphart, Zack Seal, PA-C  traMADol (ULTRAM) 50 MG tablet TAKE 1 TABLET BY MOUTH EVERY TWELVE  HOURS AS NEEDED 04/05/17  Yes Danis, Estill Cotta III, MD  zolpidem (AMBIEN) 10 MG tablet Take 10 mg by mouth at bedtime. 04/27/17  Yes [provider]  gabapentin (NEURONTIN) 100 MG capsule Take 2 capsules (200 mg total) by mouth 4 (four) times daily. Take one pill three times a day.  If drowsiness is an issue - you can take it all in the evening. Patient not taking: Reported on 05/10/2017 09/02/16 02/04/17  Lupita Dawn, MD  promethazine (PHENERGAN) 12.5 MG tablet TAKE 1 TABLET BY MOUTH EVERY 8 HOURS AS NEEDED FOR FOR NAUSEA AND VOMITING 01/09/17   Lupita Dawn, MD  SUMAtriptan (IMITREX) 25 MG tablet Take 1 tablet  (25 mg total) by mouth every 2 (two) hours as needed for migraine. May repeat in 2 hours if headache persists or recurs. 09/02/16   Lupita Dawn, MD   Meds Ordered and Administered this Visit   Medications  cloNIDine (CATAPRES) tablet 0.1 mg (0.1 mg Oral Given 05/13/17 1426)    BP (!) 163/98 (BP Location: Right Arm)   Pulse 78   Temp 98.5 F (36.9 C) (Oral)   Resp 20   SpO2 100%  No data found.   Physical Exam  Constitutional: He appears well-developed and well-nourished.  HENT:  Head: Normocephalic and atraumatic.  Right Ear: External ear normal.  Left Ear: External ear normal.  Nose: Nose normal.  Mouth/Throat: Oropharynx is clear and moist. No oropharyngeal exudate.  Eyes: Conjunctivae are normal. Pupils are equal, round, and reactive to light.  Is sensitive to otoscope light.   Neck: Normal range of motion.  Cardiovascular: Normal rate, regular rhythm and normal heart sounds.   No murmur heard. Pulmonary/Chest: Effort normal and breath sounds normal.  Abdominal: Soft. Bowel sounds are normal. There is no tenderness.  Musculoskeletal: Normal range of motion.  Neurological:  Alert and oriented 4. Cranial nerves are intact. Has good Coordination.  Muscle strength strong and equal bilaterally. Speech normal. Gait normal. Negative Romberg. Good RAM.   Skin: Skin is warm and dry.  Nursing note and vitals reviewed.   Urgent Care Course     Procedures (including critical care time)  Labs Review Labs Reviewed - No data to display  Imaging Review No results found.    MDM   1. Hypertension, unspecified type   2. Acute nonintractable headache, unspecified headache type    14:26: Clonidine 0.1 mg given 15:10: BP recheck: 161/97 unchanged.  Patient states that he is not any better, continues to have headache that is actually getting worse, dizziness and nausea. He states that this headache doesn't feel like his migraine. Advised ER; which he is willing to go.    Patient transported to ER via own vehicle for further management.    Barry Dienes, NP 05/13/17 [phone]

## 2017-05-13 NOTE — ED Provider Notes (Signed)
[facility] DEPT Provider Note   CSN: [id number] Arrival date & time: 05/13/17  1519     History   Chief Complaint Chief Complaint  Patient presents with  . Hypertension    HPI Lucile Didonato is a 31 y.o. male.  Patient is a 31 year old male who presents to the emergency department with a complaint of tiredness, fatigue, and hypertension.  The patient states that 4 days ago he noticed that he had problems with just generalized weakness and generally not feeling well. He saw his primary physician on the following day and was noted to have hypertension. It is of note that the patient has a history of hypertension and is currently on amlodipine. The patient continued to have headache and on problem and so he was sent to the emergency department where he received fluids and medication for his headache. He was discharged home and asked to follow with his primary care physician concerning his blood pressure. The patient states that he will not be able to see his primary physician for 2 or 3 more days. He continued to have problem with generalized weakness and headache. He was seen at the urgent care facility today was given clonidine in the urgent care however this did not seem to change his blood pressure very much. He was then sent here to the emergency department for additional evaluation and management and for continued care. The patient states he has a history of hypertension he also has a history of anxiety he has not had any recent injury to the head. He's not had any difficulty with vision or speech. He is not had any recent high fever or chills to be reported. His been no recent tick bites noted. He's not had any difficulty controlling bowels or bladder. His coordination has been fine, and there's been no weakness of the upper or lower extremities. His significant other states his been no change in his mental status that she has noted. The patient has tried ibuprofen and Imitrex for his  headache but states these are not helping at all. Nothing really seems to make the headache any better, and nothing makes it any worse.      Past Medical History:  Diagnosis Date  . Anal fissure   . Anxiety   . Depression   . Hypertension   . IBS (irritable bowel syndrome)   . ITP (idiopathic thrombocytopenic purpura)     Patient Active Problem List   Diagnosis Date Noted  . Cellulitis 12/30/2016  . Colon polyp 12/07/2016  . Irritable bowel syndrome 09/02/2016  . External hemorrhoid 09/02/2016  . Rectal fissure 09/02/2016  . Allergic rhinitis 04/29/2016  . Migraine 04/29/2016  . Vitamin D deficiency 11/17/2015  . Insomnia 07/27/2015  . Acne 03/09/2015  . Essential hypertension 02/27/2015  . Elevated serum creatinine 12/31/2014  . Fatigue 12/29/2014  . Preventative health care 12/29/2014  . Major depressive disorder, recurrent episode, moderate ([place]) 12/29/2014  . Chronic ITP (idiopathic thrombocytopenia) ([facility]) 12/29/2014    Past Surgical History:  Procedure Laterality Date  . APPENDECTOMY  2003   [facility] Medications    Prior to Admission medications   Medication Sig Start Date End Date Taking? Authorizing Provider  ALPRAZolam Duanne Moron) 1 MG tablet Take 1 tablet (1 mg total) by mouth 2 (two) times daily as needed for anxiety. 09/02/16   Lupita Dawn, MD  amLODipine (NORVASC) 5 MG tablet Take 5 mg by mouth daily.  [provider]  docusate sodium (COLACE) 100 MG capsule Take 1 capsule (100 mg total) by mouth 2 (two) times daily. 10/27/16   Lupita Dawn, MD  gabapentin (NEURONTIN) 100 MG capsule Take 2 capsules (200 mg total) by mouth 4 (four) times daily. Take one pill three times a day.  If drowsiness is an issue - you can take it all in the evening. Patient not taking: Reported on 05/10/2017 09/02/16 02/04/17  Lupita Dawn, MD  gabapentin (NEURONTIN) 100 MG capsule Take 200 mg by mouth 3 (three) times daily. 03/28/17    [provider]  gabapentin (NEURONTIN) 800 MG tablet Take 800 mg by mouth at bedtime. 04/17/17   [provider]  hyoscyamine (LEVSIN, ANASPAZ) 0.125 MG tablet TAKE 1 TABLET BY MOUTH EVERY 4 HOURS AS NEEDED 01/31/17   Lupita Dawn, MD  omeprazole (PRILOSEC) 20 MG capsule Take 2 capsules (40 mg total) by mouth daily. Patient taking differently: Take 40 mg by mouth daily as needed (reflux).  02/04/17   [name], PA-C  promethazine (PHENERGAN) 12.5 MG tablet TAKE 1 TABLET BY MOUTH EVERY 8 HOURS AS NEEDED FOR FOR NAUSEA AND VOMITING 01/09/17   Lupita Dawn, MD  SUMAtriptan (IMITREX) 25 MG tablet Take 1 tablet (25 mg total) by mouth every 2 (two) hours as needed for migraine. May repeat in 2 hours if headache persists or recurs. 09/02/16   Lupita Dawn, MD  traMADol (ULTRAM) 50 MG tablet TAKE 1 TABLET BY MOUTH EVERY TWELVE HOURS AS NEEDED 04/05/17   Doran Stabler, MD  zolpidem (AMBIEN) 10 MG tablet Take 10 mg by mouth at bedtime. 04/27/17   [provider]    Family History Family History  Problem Relation Age of Onset  . Diabetes Father   . Hypertension Father   . Colon polyps Father   . Heart disease Maternal Grandfather   . Alzheimer's disease Paternal Grandmother   . Depression Paternal Grandfather   . Colon polyps Paternal Grandfather   . Colon cancer Paternal Aunt     Social History Social History  Substance Use Topics  . Smoking status: Never Smoker  . Smokeless tobacco: Never Used  . Alcohol use 0.0 oz/week     Comment: rarely     Allergies   Sulfa antibiotics and Wellbutrin [bupropion]   Review of Systems Review of Systems  Constitutional: Positive for fatigue. Negative for activity change and appetite change.  HENT: Negative for congestion, ear discharge, ear pain, facial swelling, nosebleeds, rhinorrhea, sneezing and tinnitus.   Eyes: Negative for photophobia, pain and discharge.  Respiratory: Negative for cough, choking,  shortness of breath and wheezing.   Cardiovascular: Negative for chest pain, palpitations and leg swelling.  Gastrointestinal: Negative for abdominal pain, blood in stool, constipation, diarrhea, nausea and vomiting.  Genitourinary: Negative for difficulty urinating, dysuria, flank pain, frequency and hematuria.  Musculoskeletal: Negative for back pain, gait problem, myalgias and neck pain.  Skin: Negative for color change, rash and wound.  Neurological: Positive for headaches. Negative for dizziness, seizures, syncope, facial asymmetry, speech difficulty, weakness and numbness.  Hematological: Negative for adenopathy. Does not bruise/bleed easily.  Psychiatric/Behavioral: Negative for agitation, confusion, hallucinations, self-injury and suicidal ideas. The patient is not nervous/anxious.      Physical Exam Updated Vital Signs BP (!) 151/105 (BP Location: Right Arm)   Pulse 66   Temp 98 F (36.7 C) (Oral)   Resp 16   Ht 5\' 10"  (1.778 m)  Wt 81.6 kg (180 lb)   SpO2 100%   BMI 25.83 kg/m   Physical Exam  Constitutional: He appears well-developed and well-nourished. No distress.  HENT:  Head: Normocephalic and atraumatic.  Right Ear: External ear normal.  Left Ear: External ear normal.  Eyes: Conjunctivae are normal. Right eye exhibits no discharge. Left eye exhibits no discharge. No scleral icterus.  Neck: Neck supple. No tracheal deviation present.  Cardiovascular: Normal rate, regular rhythm and intact distal pulses.   Pulmonary/Chest: Effort normal and breath sounds normal. No stridor. No respiratory distress. He has no wheezes. He has no rales.  Abdominal: Soft. Bowel sounds are normal. He exhibits no distension. There is no tenderness. There is no rebound and no guarding.  Musculoskeletal: He exhibits no edema or tenderness.  Neurological: He is alert. He has normal strength. No cranial nerve deficit (no facial droop, extraocular movements intact, no slurred speech) or  sensory deficit. He exhibits normal muscle tone. He displays no seizure activity. Coordination normal.  Skin: Skin is warm and dry. No rash noted.  Psychiatric: He has a normal mood and affect.  Nursing note and vitals reviewed.    ED Treatments / Results  Labs (all labs ordered are listed, but only abnormal results are displayed) Labs Reviewed - No data to display  EKG  EKG Interpretation None       Radiology No results found.  Procedures Procedures (including critical care time)  Medications Ordered in ED Medications - No data to display   Initial Impression / Assessment and Plan / ED Course  I have reviewed the triage vital signs and the nursing notes.  Pertinent labs & imaging results that were available during my care of the patient were reviewed by me and considered in my medical decision making (see chart for details).       Final Clinical Impressions(s) / ED Diagnoses MDM Patient reports problems with headache and hypertension over the last few days. He has not had any loss of bowel or bladder function. No interruption in his activities of daily living, and no problems with coordination.  Vital signs reviewed. Blood pressure is elevated at 163/98.  Urinalysis is within normal limits .  Patient treated in the emergency department with a migraine cocktail.  Competence of metabolic panel and complete blood count are well within normal limits.  Recheck the patient states the headache is much improved. The blood pressure is much improved. Patient ambulated in the room as well as in the hall. No gross neurologic deficits appreciated. Feel that the patient to be discharged home. Patient will be placed on 12.5 mg of hydrochlorothiazide. The patient is also given a 12 tablets of the Butalbatol/codeine for assistance with headaches not improved by ibuprofen until the patient can be seen by his primary physician. Patient is in agreement with this plan.    Final  diagnoses:  Essential hypertension  Nonintractable headache, unspecified chronicity pattern, unspecified headache type    New Prescriptions New Prescriptions   No medications on file     Lily Kocher, Hershal Coria 05/15/17 5009    Drenda Freeze, MD 05/15/17 1459

## 2017-05-13 NOTE — ED Triage Notes (Signed)
Pt has been having high bp and accompanied by a migraine and nausea since Tuesday. Is on a bp medication and did go to the ER on wed. But didn't give him anything for his bp. Pt is taking his bp medication regularly.

## 2017-05-13 NOTE — Discharge Instructions (Signed)
Your blood pressure has improved following treatment of your headache. Please add Microzide to your current medication. Please take this medication with a meal. Use tylenol or ibuprofen for mild headache pain. Use Fioricet/Codeine for more severe headache pain. Please discuss your headaches with your primary MD.

## 2017-05-13 NOTE — ED Triage Notes (Signed)
Pt complaining of hypertension and headache. Pt complaining of some dizziness. Pt states takes amlodipine daily. Given clonidine at urgent care today. Pt denies any blurred vision, states some dizziness.

## 2017-05-14 NOTE — Progress Notes (Signed)
Subjective:   Patient ID: Douglas Knight    DOB: Aug 12, 1986, 31 y.o. male   MRN: [id number]  CC: "high blood pressure"  HPI: Douglas Knight is a 31 y.o. male who presents to clinic today for high blood pressure. Problems discussed today are as follows:  Headache: Patient initially seen in ED for acute onset headache with associated nausea without vomiting on 6/13. Was given migraine cocktail and IVF and discharged. Patient's BP remains elevated prompting visit to UC with persistent symptoms at which point he was given clonidine without improvement in sent to ED where he received migraine cocktail again. Patient presenting for HTN follow-up. He was started on 12.5 mg HCTZ on discharge in ED. Patient states his headaches are not consistent with his typical migraine and/or bilateral radiating from frontal region to occiput. Nausea intermittent decrease appetite without emesis. Minimal relief with Fiorcet given by ED. ROS: Denies fevers or chills, nuchal rigidity, photophobia, vomiting, aura.  High blood pressure: Has history of HTN on amlodipine. Recently started on HCTZ by ED for persistent elevated BP. Patient tolerating HCTZ well with some improvement. Patient states BP was elevated 338 systolic and ED. Continues to take amlodipine as scheduled. Nonsmoker.  Anxiety: Currently followed by Dr. Albertine Patricia with psychiatry and Dr. Ruthann Cancer with psychotherapy. Has received multiple forms of treatment without success. Recently attempted 2 rounds of transcranial magnetic. Patient has been in touch with psychiatrist who is aware of his recent ED visit. Has scheduled follow-up at the end of the week. ROS: Denies feelings of impending doom, diaphoresis, palpitations, shortness of breath, diarrhea, depression.  Complete ROS performed, see HPI for pertinent.  [place]: HTN, migraines, IBS, ITP, MDD. H/o appendectomy. Father h/o DM, HTN, colonic polyps. Smoking status reviewed. Medications  reviewed.  Objective:   BP 140/86   Pulse 81   Temp 98.5 F (36.9 C) (Oral)   Ht 5\' 10"  (1.778 m)   Wt 180 lb (81.6 kg)   SpO2 99%   BMI 25.83 kg/m  Vitals and nursing note reviewed.  General: anxious young adult male, well nourished, well developed, in no acute distress with non-toxic appearance HEENT: normocephalic, atraumatic, moist mucous membranes, PERRLA, EOMI, no photophobia CV: regular rate and rhythm without murmurs, rubs, or gallops, no lower extremity edema Lungs: clear to auscultation bilaterally with normal work of breathing Skin: warm, dry, no rashes or lesions, cap refill < 2 seconds Extremities: warm and well perfused, normal tone Psych: dysthymic mood, flat affect  Assessment & Plan:   Essential hypertension Chronic. Uncontrolled. Previously controlled on amlodipine 5 mg. Recently started HCTZ 12.5 mg. Remains elevated but improved. --Increase amlodipine to 10 mg daily and continue HCTZ 12.5 mg daily  Acute tension headache Acute. Tension-like by characteristics. Not consistent with chronic migraine headaches. No meningeal signs though concerning given intractable state. --Will obtain a noncontrast head CT and follow-up as appropriate --Given additional prescription for tramadol 80 mg q8h PRN for pain and Phenergan 12.5 mg q8h PRN for nausea  Generalized anxiety disorder Chronic. Stable. Followed by psychiatry and psychotherapy. Recently treated with transcranial magnets. Suspect this could be contributing to headache however not certain. --Patient instructed to follow-up with psychiatrist and psychotherapy this week, appointment already scheduled --Dr. Ree Kida to refill prescription for Imitrex  Orders Placed This Encounter  Procedures  . CT HEAD WO CONTRAST    Epic order Wt-180/no needs Ins-[facility],jessica [phone]    Standing Status:   Future    Standing Expiration Date:   08/15/2018  Order Specific Question:   Reason for Exam (SYMPTOM  OR  DIAGNOSIS REQUIRED)    Answer:   intractable headache    Order Specific Question:   Preferred imaging location?    Answer:   [id number]    Order Specific Question:   Radiology Contrast Protocol - do NOT remove file path    Answer:   [email or web address]   Meds ordered this encounter  Medications  . amLODipine (NORVASC) 10 MG tablet    Sig: Take 1 tablet (10 mg total) by mouth daily.    Dispense:  90 tablet    Refill:  3  . traMADol (ULTRAM) 50 MG tablet    Sig: Take 1 tablet (50 mg total) by mouth every 8 (eight) hours as needed.    Dispense:  20 tablet    Refill:  0  . promethazine (PHENERGAN) 12.5 MG tablet    Sig: Take 1 tablet (12.5 mg total) by mouth every 8 (eight) hours as needed for nausea or vomiting.    Dispense:  20 tablet    Refill:  0    [name], [place], PGY-1 05/15/2017 11:58 AM

## 2017-05-15 ENCOUNTER — Other Ambulatory Visit: Payer: Self-pay | Admitting: Family Medicine

## 2017-05-15 ENCOUNTER — Encounter: Payer: Self-pay | Admitting: Family Medicine

## 2017-05-15 ENCOUNTER — Ambulatory Visit (INDEPENDENT_AMBULATORY_CARE_PROVIDER_SITE_OTHER): Payer: BLUE CROSS/BLUE SHIELD | Admitting: Family Medicine

## 2017-05-15 VITALS — BP 140/86 | HR 81 | Temp 98.5°F | Ht 70.0 in | Wt 180.0 lb

## 2017-05-15 DIAGNOSIS — F411 Generalized anxiety disorder: Secondary | ICD-10-CM

## 2017-05-15 DIAGNOSIS — I10 Essential (primary) hypertension: Secondary | ICD-10-CM

## 2017-05-15 DIAGNOSIS — G44209 Tension-type headache, unspecified, not intractable: Secondary | ICD-10-CM | POA: Insufficient documentation

## 2017-05-15 DIAGNOSIS — G43009 Migraine without aura, not intractable, without status migrainosus: Secondary | ICD-10-CM

## 2017-05-15 DIAGNOSIS — G44201 Tension-type headache, unspecified, intractable: Secondary | ICD-10-CM | POA: Diagnosis not present

## 2017-05-15 MED ORDER — PROMETHAZINE HCL 12.5 MG PO TABS: 12.5000 mg | ORAL_TABLET | Freq: Three times a day (TID) | ORAL | 0 refills | Status: DC | PRN

## 2017-05-15 MED ORDER — SUMATRIPTAN SUCCINATE 25 MG PO TABS: 25.0000 mg | ORAL_TABLET | ORAL | 0 refills | Status: DC | PRN

## 2017-05-15 MED ORDER — TRAMADOL HCL 50 MG PO TABS: 50.0000 mg | ORAL_TABLET | Freq: Three times a day (TID) | ORAL | 0 refills | Status: DC | PRN

## 2017-05-15 MED ORDER — AMLODIPINE BESYLATE 10 MG PO TABS: 10.0000 mg | ORAL_TABLET | Freq: Every day | ORAL | 3 refills | Status: DC

## 2017-05-15 NOTE — Assessment & Plan Note (Addendum)
Acute. Tension-like by characteristics. Not consistent with chronic migraine headaches. No meningeal signs though concerning given intractable state. --Will obtain a noncontrast head CT and follow-up as appropriate --Given additional prescription for tramadol 80 mg q8h PRN for pain and Phenergan 12.5 mg q8h PRN for nausea

## 2017-05-15 NOTE — Assessment & Plan Note (Addendum)
Chronic. Uncontrolled. Previously controlled on amlodipine 5 mg. Recently started HCTZ 12.5 mg. Remains elevated but improved. --Increase amlodipine to 10 mg daily and continue HCTZ 12.5 mg daily

## 2017-05-15 NOTE — Assessment & Plan Note (Addendum)
Chronic. Stable. Followed by psychiatry and psychotherapy. Recently treated with transcranial magnets. Suspect this could be contributing to headache however not certain. --Patient instructed to follow-up with psychiatrist and psychotherapy this week, appointment already scheduled --Dr. Ree Kida to refill prescription for Imitrex

## 2017-05-15 NOTE — Assessment & Plan Note (Deleted)
A 

## 2017-05-15 NOTE — Patient Instructions (Addendum)
Thank you for coming in to see [place] today. Please see below to review our plan for today's visit.  1. Please continue taking your HCTZ and amlodipine. I will increase your amlodipine to 10 mg daily. Check your BP a few days during the week and log them to bring to your next visit. 2. You can take the [place] as needed for your headaches. If this does not help after a few doses, I would discontinue this. In the meantime, I have given you an additional prescription for tramadol if pain worsens. The phenergan is for nausea if this persists. 3. We get a head CT. They will call and schedule this appointment. I will notify you of the results. 3. Return to the clinic in 2 weeks for recheck of your BP.  Please call the clinic at [phone] if your symptoms worsen or you have any concerns. It was my pleasure to see you. -- [name], [place], PGY-1

## 2017-05-16 ENCOUNTER — Ambulatory Visit
Admission: RE | Admit: 2017-05-16 | Discharge: 2017-05-16 | Disposition: A | Discharge status: Home / Self Care | Payer: BLUE CROSS/BLUE SHIELD | Source: Ambulatory Visit | Attending: Family Medicine | Admitting: Family Medicine

## 2017-05-16 DIAGNOSIS — G44201 Tension-type headache, unspecified, intractable: Secondary | ICD-10-CM

## 2017-05-16 DIAGNOSIS — R42 Dizziness and giddiness: Secondary | ICD-10-CM | POA: Diagnosis not present

## 2017-05-17 ENCOUNTER — Encounter: Payer: Self-pay | Admitting: Family Medicine

## 2017-05-21 ENCOUNTER — Encounter (HOSPITAL_COMMUNITY): Payer: Self-pay | Admitting: Emergency Medicine

## 2017-05-21 ENCOUNTER — Emergency Department (HOSPITAL_COMMUNITY)
Admission: EM | Admit: 2017-05-21 | Discharge: 2017-05-21 | Disposition: A | Discharge status: Home / Self Care | Payer: BLUE CROSS/BLUE SHIELD | Attending: Emergency Medicine | Admitting: Emergency Medicine

## 2017-05-21 DIAGNOSIS — Z79899 Other long term (current) drug therapy: Secondary | ICD-10-CM | POA: Diagnosis not present

## 2017-05-21 DIAGNOSIS — I10 Essential (primary) hypertension: Secondary | ICD-10-CM | POA: Insufficient documentation

## 2017-05-21 DIAGNOSIS — R339 Retention of urine, unspecified: Secondary | ICD-10-CM

## 2017-05-21 LAB — BASIC METABOLIC PANEL
ANION GAP: 8 (ref 5–15)
BUN: 16 mg/dL (ref 6–20)
CHLORIDE: 98 mmol/L — AB (ref 101–111)
CO2: 32 mmol/L (ref 22–32)
Calcium: 9.6 mg/dL (ref 8.9–10.3)
Creatinine, Ser: 1.57 mg/dL — ABNORMAL HIGH (ref 0.61–1.24)
GFR calc non Af Amer: 57 mL/min — ABNORMAL LOW (ref >60)
Glucose, Bld: 105 mg/dL — ABNORMAL HIGH (ref 65–99)
Potassium: 3.7 mmol/L (ref 3.5–5.1)
SODIUM: 138 mmol/L (ref 135–145)

## 2017-05-21 LAB — URINALYSIS, ROUTINE W REFLEX MICROSCOPIC
Bilirubin Urine: NEGATIVE
Glucose, UA: NEGATIVE mg/dL
Hgb urine dipstick: NEGATIVE
KETONES UR: NEGATIVE mg/dL
LEUKOCYTES UA: NEGATIVE
NITRITE: NEGATIVE
PH: 5 (ref 5.0–8.0)
PROTEIN: NEGATIVE mg/dL
Specific Gravity, Urine: 1.019 (ref 1.005–1.030)

## 2017-05-21 LAB — CBC WITH DIFFERENTIAL/PLATELET
BASOS ABS: 0.1 10*3/uL (ref 0.0–0.1)
BASOS PCT: 1 %
Eosinophils Absolute: 0.2 10*3/uL (ref 0.0–0.7)
Eosinophils Relative: 4 %
HEMATOCRIT: 44.5 % (ref 39.0–52.0)
HEMOGLOBIN: 16.2 g/dL (ref 13.0–17.0)
Lymphocytes Relative: 26 %
Lymphs Abs: 1.4 10*3/uL (ref 0.7–4.0)
MCH: 32.3 pg (ref 26.0–34.0)
MCHC: 36.4 g/dL — ABNORMAL HIGH (ref 30.0–36.0)
MCV: 88.8 fL (ref 78.0–100.0)
MONOS PCT: 8 %
Monocytes Absolute: 0.4 10*3/uL (ref 0.1–1.0)
NEUTROS ABS: 3.3 10*3/uL (ref 1.7–7.7)
NEUTROS PCT: 62 %
Platelets: 107 10*3/uL — ABNORMAL LOW (ref 150–400)
RBC: 5.01 MIL/uL (ref 4.22–5.81)
RDW: 12.2 % (ref 11.5–15.5)
WBC: 5.3 10*3/uL (ref 4.0–10.5)

## 2017-05-21 NOTE — ED Provider Notes (Signed)
[facility] DEPT Provider Note   CSN: [id number] Arrival date & time: 05/21/17  2671     History   Chief Complaint Chief Complaint  Patient presents with  . Urinary Retention    HPI Douglas Knight is a 31 y.o. male.  The history is provided by the patient.   31 year old male who presents with urinary retention. He has a history of hypertension, IBS, anxiety, depression, and ITP. Onset of urinary retention over the past 24 hours. He states that the last time he has urinated was yesterday morning. Prior to this denies any hematuria, urinary frequency, dysuria. Reports abdominal distention and abdominal pain in the setting of urinary retention. This had a low-grade fever yesterday. He  was recently increased on the dose of his amlodipine for hypertension, and started on tramadol and Fioricet for headaches. No recent procedures or anesthesia. No nausea, vomiting, diarrhea, cough, difficulty breathing or chest pain. No alleviating or exacerbating factors.  Past Medical History:  Diagnosis Date  . Anal fissure   . Anxiety   . Depression   . Hypertension   . IBS (irritable bowel syndrome)   . ITP (idiopathic thrombocytopenic purpura)     Patient Active Problem List   Diagnosis Date Noted  . Acute tension headache 05/15/2017  . Generalized anxiety disorder 05/15/2017  . Cellulitis 12/30/2016  . Colon polyp 12/07/2016  . Irritable bowel syndrome 09/02/2016  . External hemorrhoid 09/02/2016  . Rectal fissure 09/02/2016  . Allergic rhinitis 04/29/2016  . Migraine 04/29/2016  . Vitamin D deficiency 11/17/2015  . Insomnia 07/27/2015  . Acne 03/09/2015  . Essential hypertension 02/27/2015  . Elevated serum creatinine 12/31/2014  . Fatigue 12/29/2014  . Preventative health care 12/29/2014  . Major depressive disorder, recurrent episode, moderate ([place]) 12/29/2014  . Chronic ITP (idiopathic thrombocytopenia) (HCC) 12/29/2014    Past Surgical History:  Procedure Laterality  Date  . APPENDECTOMY  2003   [place] Medications    Prior to Admission medications   Medication Sig Start Date End Date Taking? Authorizing Provider  ALPRAZolam Duanne Moron) 1 MG tablet Take 1 tablet (1 mg total) by mouth 2 (two) times daily as needed for anxiety. 09/02/16  Yes Lupita Dawn, MD  amLODipine (NORVASC) 10 MG tablet Take 1 tablet (10 mg total) by mouth daily. 05/15/17  Yes [place] Bing, DO  butalbital-acetaminophen-caffeine (FIORICET/CODEINE) [phone] MG capsule 1 or 2 po q6h prn headache Patient taking differently: Take 1-2 capsules by mouth every 6 (six) hours as needed for headache.  05/13/17  Yes Lily Kocher, PA-C  gabapentin (NEURONTIN) 100 MG capsule Take 200 mg by mouth See admin instructions. Take 2 capsules (200 mg) by mouth three times daily - morning, noon and evening (take 800 mg tablet at bedtime) 03/28/17  Yes [provider]  gabapentin (NEURONTIN) 800 MG tablet Take 800 mg by mouth at bedtime. 04/17/17  Yes [provider]  hydrochlorothiazide (MICROZIDE) 12.5 MG capsule Take 1 capsule (12.5 mg total) by mouth daily. 05/13/17  Yes Lily Kocher, PA-C  promethazine (PHENERGAN) 12.5 MG tablet Take 1 tablet (12.5 mg total) by mouth every 8 (eight) hours as needed for nausea or vomiting. 05/15/17  Yes [place] Bing, DO  SUMAtriptan (IMITREX) 25 MG tablet Take 1 tablet (25 mg total) by mouth every 2 (two) hours as needed for migraine. May repeat in 2 hours if headache persists or recurs. 05/15/17  Yes Lupita Dawn, MD  traMADol Veatrice [place])  50 MG tablet TAKE 1 TABLET BY MOUTH EVERY TWELVE HOURS AS NEEDED Patient taking differently: TAKE 1 TABLET BY MOUTH EVERY TWELVE HOURS AS NEEDED FOR PAIN 04/05/17  Yes Danis, Estill Cotta III, MD  zolpidem (AMBIEN) 10 MG tablet Take 10 mg by mouth at bedtime. 04/27/17  Yes [provider]  docusate sodium (COLACE) 100 MG capsule Take 1 capsule (100 mg total) by mouth 2 (two)  times daily. Patient taking differently: Take 100 mg by mouth 2 (two) times daily as needed (constipation).  10/27/16   Lupita Dawn, MD  hyoscyamine (LEVSIN, ANASPAZ) 0.125 MG tablet TAKE 1 TABLET BY MOUTH EVERY 4 HOURS AS NEEDED Patient taking differently: TAKE 1 TABLET BY MOUTH EVERY 4 HOURS AS NEEDED FOR UNCONTROLLED DIARRHEA 01/31/17   Lupita Dawn, MD  omeprazole (PRILOSEC) 20 MG capsule Take 2 capsules (40 mg total) by mouth daily. Patient taking differently: Take 40 mg by mouth daily as needed (reflux).  02/04/17   [name], PA-C  traMADol (ULTRAM) 50 MG tablet Take 1 tablet (50 mg total) by mouth every 8 (eight) hours as needed. Patient not taking: Reported on 05/21/2017 05/15/17   [place] Bing, DO    Family History Family History  Problem Relation Age of Onset  . Diabetes Father   . Hypertension Father   . Colon polyps Father   . Heart disease Maternal Grandfather   . Alzheimer's disease Paternal Grandmother   . Depression Paternal Grandfather   . Colon polyps Paternal Grandfather   . Colon cancer Paternal Aunt     Social History Social History  Substance Use Topics  . Smoking status: Never Smoker  . Smokeless tobacco: Never Used  . Alcohol use 0.0 oz/week     Comment: rarely     Allergies   Sulfa antibiotics and Wellbutrin [bupropion]   Review of Systems Review of Systems  Constitutional: Negative for fever.  HENT: Negative for congestion.   Respiratory: Negative for shortness of breath.   Cardiovascular: Negative for chest pain.  Gastrointestinal: Positive for abdominal pain and nausea. Negative for vomiting.  Genitourinary: Positive for difficulty urinating. Negative for dysuria and frequency.  Allergic/Immunologic: Negative for immunocompromised state.  Hematological: Does not bruise/bleed easily.  All other systems reviewed and are negative.    Physical Exam Updated Vital Signs BP 133/86   Pulse 71   Temp 97.9 F (36.6 C) (Oral)    Resp 18   SpO2 100%   Physical Exam Physical Exam  Nursing note and vitals reviewed. Constitutional: Well developed, well nourished, non-toxic, appears anxious but in no distress Head: Normocephalic and atraumatic.  Mouth/Throat: Oropharynx is clear and moist.  Neck: Normal range of motion. Neck supple.  Cardiovascular: Normal rate and regular rhythm.   Pulmonary/Chest: Effort normal and breath sounds normal.  Abdominal: Soft. There is low abdominal tenderness. Moderate abdominal distension. There is no rebound and no guarding.  Musculoskeletal: Normal range of motion.  Neurological: Alert, no facial droop, fluent speech, moves all extremities symmetrically Skin: Skin is warm and dry.  Psychiatric: Cooperative   ED Treatments / Results  Labs (all labs ordered are listed, but only abnormal results are displayed) Labs Reviewed  CBC WITH DIFFERENTIAL/PLATELET - Abnormal; Notable for the following:       Result Value   MCHC 36.4 (*)    Platelets 107 (*)    All other components within normal limits  BASIC METABOLIC PANEL - Abnormal; Notable for the following:    Chloride 98 (*)  Glucose, Bld 105 (*)    Creatinine, Ser 1.57 (*)    GFR calc non Af Amer 57 (*)    All other components within normal limits  URINALYSIS, ROUTINE W REFLEX MICROSCOPIC    EKG  EKG Interpretation None       Radiology No results found.  Procedures Procedures (including critical care time)  Medications Ordered in ED Medications - No data to display   Initial Impression / Assessment and Plan / ED Course  I have reviewed the triage vital signs and the nursing notes.  Pertinent labs & imaging results that were available during my care of the patient were reviewed by me and considered in my medical decision making (see chart for details).     31 year old male who presents with urinary retention. With retaining 800 mL of urine on bladder scan. Discussed Foley catheter placement, but he  requested not to have Foley and would rather have it and catheter with trial of urination. UA without infection. Mild history of present illness likely from retention on blood work. Drinking oral fluids, initially was having some difficulty urine, both subsequently able to void with minimal postvoid residual. I suspect this may be due to tramadol as he recently started taking this medication again, and I had him discontinue this. Urology follow-up is also given. Strict return and follow-up instructions reviewed. He expressed understanding of all discharge instructions and felt comfortable with the plan of care.   Final Clinical Impressions(s) / ED Diagnoses   Final diagnoses:  Urinary retention    New Prescriptions New Prescriptions   No medications on file     Forde Dandy, MD 05/21/17 1245

## 2017-05-21 NOTE — Discharge Instructions (Signed)
Please call urology for follow-up. Return for worsening symptoms, including fever, intractable vomiting, recurrent urinary retention, abdominal pain or any other symptoms concerning to you. Stop taking tramadol for now.

## 2017-05-21 NOTE — ED Notes (Signed)
Pt able to void in bathroom. Post void residual was 74mL.

## 2017-05-21 NOTE — ED Notes (Signed)
Pt given 2 cups of water and gingerale to drink. Pt does not feel like he has to urinate at this time.

## 2017-05-21 NOTE — ED Triage Notes (Signed)
Pt. Stated, Im having urinary retention. I've not gone in 24 hours.

## 2017-05-23 ENCOUNTER — Ambulatory Visit (INDEPENDENT_AMBULATORY_CARE_PROVIDER_SITE_OTHER): Payer: BLUE CROSS/BLUE SHIELD | Admitting: Family Medicine

## 2017-05-23 ENCOUNTER — Encounter: Payer: Self-pay | Admitting: Family Medicine

## 2017-05-23 VITALS — BP 124/78 | HR 97 | Temp 98.2°F | Ht 70.0 in | Wt 172.2 lb

## 2017-05-23 DIAGNOSIS — R5383 Other fatigue: Secondary | ICD-10-CM | POA: Diagnosis not present

## 2017-05-23 DIAGNOSIS — R7989 Other specified abnormal findings of blood chemistry: Secondary | ICD-10-CM | POA: Diagnosis not present

## 2017-05-23 DIAGNOSIS — G43009 Migraine without aura, not intractable, without status migrainosus: Secondary | ICD-10-CM

## 2017-05-23 DIAGNOSIS — R339 Retention of urine, unspecified: Secondary | ICD-10-CM | POA: Diagnosis not present

## 2017-05-23 NOTE — Progress Notes (Signed)
Subjective: CC: Ed follow up HPI: Douglas Knight is a 31 y.o. male presenting to clinic today for:  Patient seen in ED 6/13, 6/16, and 6/24 for headache, HTN and urinary retention respectively.  He notes that he has a chronic h/o headaches.  He notes that over the last 3 weeks headaches have been daily.  They are minimally relieved by Imitrex and Fiorecet that was given to him in ED.  He reports he used to see a Neurologist several years ago and he had a home sleep study that resulted on a trial of CPAP that he did not tolerate.  He denies visual disturbance.  He reports associated nausea that is relieved by promethazine.  He takes this about every other day.  He notes that the most recent ED visit was for urinary retention that started on Saturday.  He reports that the entire day, he was not able to void so he went to ED.  In ED, he was found to have an elevation in his Cr to 1.57 (b/l 1.08-1.22).  He had a bladder scan that showed 800cc.  He had an in and out cath with UA that was unremarkable.  CBC notable for platelets 107.  VSS and was afebrile at that visit.  He had been and continues to tolerate PO without difficulty.   Recommendation in ED was to discontinue Tramadol, as this was thought to be the culprit.  He was advised to follow up with Urology, whom he was unable to reach via phone.  He notes that has been urinating and he feels like he is voiding.  He does report some discomfort with urination.  Denies hematuria, fevers, chills, dribbling, hesitancy, nocturia, penile discharge, testicular or penile pain/ lesions/ swelling.  Reports malaise.Famhx BPH in grandfather but no prostate or ovarian cancers.  Seldom (<1x/ month) use of Benadryl for allergy symptoms.  Rare use of Levsin for IBS.  No use of antihistamines or medications not prescribed to him.  Reports some intermittent thoracic back pain w/ retention in ED but no lumbar involvement, saddle anesthesia, fecal incontinence, LE weakness.   Additionally, he reports poor appetite.  Social Hx reviewed. MedHx, medications and allergies reviewed.  Please see EMR. ROS: Per HPI  Objective: Office vital signs reviewed. BP 124/78   Pulse 97   Temp 98.2 F (36.8 C) (Oral)   Ht 5\' 10"  (1.778 m)   Wt 172 lb 3.2 oz (78.1 kg)   SpO2 95%   BMI 24.71 kg/m   Physical Examination:  General: Awake, alert, well nourished, No acute distress, nontoxic male HEENT: Normal    Eyes: EOMI, sclera white    Throat: moist mucus membranes Cardio: regular rate and rhythm, S1S2 heard, no murmurs appreciated Pulm: clear to auscultation bilaterally, no wheezes, rhonchi or rales; normal work of breathing on room air GI: soft, mild RLQ and suprapubic tenderness, no rebound, negative Murphys, negative McBurneys.  non-distended, bowel sounds present x4, no hepatomegaly, no splenomegaly, no masses Skin: dry; intact; no rashes or lesions Neuro: no focal neurologic deficits  Assessment/ Plan: 31 y.o. male   1. Elevated serum creatinine.  Found in ED.  Likely elevated in the setting of urinary retention.  If persistently elevated, would recommend renal ultrasound for closer evaluation. - Basic metabolic panel - Ambulatory referral to Urology  2. Urinary retention.  Improving but not back to baseline.  Nothing to suggest cauda equina.  Doubt BPH in the absence of other urinary symptoms and age.  No  famhx to suggest prostate cancer.  UA without evidence of infection/ blood.  Likely medication induced.  Counseled on avoidance of anticholinergics/antihistamines as able.  Strict return precautions reviewed.  Will place formal referral to urology, as patient would like to follow up with them. - Ambulatory referral to Urology  3. Other fatigue.  CBC without evidence of anemia or infection.  He does have a chronic thrombocytopenia that was slightly worse.  I don't see a formal workup by Heme.  Could consider referral for this.  Last TSH 12/2015 1.37.  Doubt  thyroid mediated.  ?Malabsorption.  He has a h/o IBS that he sees GI for.  Also, has some mental health hx.  Could also be contributing. - Could consider working this up if persistent.     4. Migraine without aura and without status migrainosus, not intractable - Medication recs as above.   - AMB referral to headache clinic  Follow up with PCP in next 1-2 weeks for follow up on above symptoms/ possible continued workup if no resolution.  Janora Norlander, DO PGY-3, [facility]

## 2017-05-23 NOTE — Patient Instructions (Signed)
I have ordered a repeat BMP today to recheck your kidney function.  Your urinary symptoms are likely medication induced.  I recommend that you avoid medications like benadryl, zyrtec, claritin.  Use hyoscyamine and promethazine sparingly.  I have placed a referral to the headache clinic for you.    Acute Urinary Retention, Male Acute urinary retention is the temporary inability to urinate. This is a common problem in older men. As men age their prostates become larger and block the flow of urine from the bladder. This is usually a problem that has come on gradually. Follow these instructions at home: If you are sent home with a Foley catheter and a drainage system, you will need to discuss the best course of action with your health care provider. While the catheter is in, maintain a good intake of fluids. Keep the drainage bag emptied and lower than your catheter. This is so that contaminated urine will not flow back into your bladder, which could lead to a urinary tract infection. There are two main types of drainage bags. One is a large bag that usually is used at night. It has a good capacity that will allow you to sleep through the night without having to empty it. The second type is called a leg bag. It has a smaller capacity, so it needs to be emptied more frequently. However, the main advantage is that it can be attached by a leg strap and can go underneath your clothing, allowing you the freedom to move about or leave your home. Only take over-the-counter or prescription medicines for pain, discomfort, or fever as directed by your health care provider. Contact a health care provider if:  You develop a low-grade fever.  You experience spasms or leakage of urine with the spasms. Get help right away if:  You develop chills or fever.  Your catheter stops draining urine.  Your catheter falls out.  You start to develop increased bleeding that does not respond to rest and increased fluid  intake. This information is not intended to replace advice given to you by your health care provider. Make sure you discuss any questions you have with your health care provider. Document Released: 02/20/2001 Document Revised: 04/27/2016 Document Reviewed: 04/25/2013 Elsevier Interactive Patient Education  2017 [employer].

## 2017-05-24 ENCOUNTER — Ambulatory Visit (INDEPENDENT_AMBULATORY_CARE_PROVIDER_SITE_OTHER): Payer: BLUE CROSS/BLUE SHIELD | Admitting: Physician Assistant

## 2017-05-24 ENCOUNTER — Telehealth: Payer: Self-pay | Admitting: Family Medicine

## 2017-05-24 ENCOUNTER — Encounter: Payer: Self-pay | Admitting: Physician Assistant

## 2017-05-24 ENCOUNTER — Encounter: Payer: Self-pay | Admitting: Family Medicine

## 2017-05-24 VITALS — BP 134/81 | HR 98 | Temp 98.3°F | Resp 18 | Ht 69.13 in | Wt 169.4 lb

## 2017-05-24 DIAGNOSIS — G43009 Migraine without aura, not intractable, without status migrainosus: Secondary | ICD-10-CM | POA: Diagnosis not present

## 2017-05-24 DIAGNOSIS — G44201 Tension-type headache, unspecified, intractable: Secondary | ICD-10-CM

## 2017-05-24 LAB — BASIC METABOLIC PANEL
BUN / CREAT RATIO: 11 (ref 9–20)
BUN: 16 mg/dL (ref 6–20)
CO2: 26 mmol/L (ref 20–29)
Calcium: 9.9 mg/dL (ref 8.7–10.2)
Chloride: 94 mmol/L — ABNORMAL LOW (ref 96–106)
Creatinine, Ser: 1.44 mg/dL — ABNORMAL HIGH (ref 0.76–1.27)
GFR calc Af Amer: 74 mL/min/{1.73_m2} (ref >59)
GFR, EST NON AFRICAN AMERICAN: 64 mL/min/{1.73_m2} (ref >59)
Glucose: 98 mg/dL (ref 65–99)
POTASSIUM: 3.5 mmol/L (ref 3.5–5.2)
SODIUM: 139 mmol/L (ref 134–144)

## 2017-05-24 MED ORDER — SUMATRIPTAN SUCCINATE 25 MG PO TABS: 25.0000 mg | ORAL_TABLET | ORAL | 0 refills | Status: DC | PRN

## 2017-05-24 NOTE — Patient Instructions (Addendum)
Unfortunately because you drove yourself here to[place] and your kidney function is elevated, we cannot give our typical "headache cocktail" in office. I do recommend you go home and drink water. Take your prescribed medication (fioricet and phenergan) and rest. I recommend following up with the headache clinic. [facility] center has placed that referral and typically it takes a couple weeks to hear from them. In the meantime, I have refilled your imitrex. Follow up with Dr. Mingo Amber as planned. If any of your symptoms worsen or you develop new concerning symptoms, seek care immediately at the ED.   General Headache Without Cause A headache is pain or discomfort felt around the head or neck area. There are many causes and types of headaches. In some cases, the cause may not be found. Follow these instructions at home: Managing pain  Take over-the-counter and prescription medicines only as told by your doctor.  Lie down in a dark, quiet room when you have a headache.  If directed, apply ice to the head and neck area: ? Put ice in a plastic bag. ? Place a towel between your skin and the bag. ? Leave the ice on for 20 minutes, 2-3 times per day.  Use a heating pad or hot shower to apply heat to the head and neck area as told by your doctor.  Keep lights dim if bright lights bother you or make your headaches worse. Eating and drinking  Eat meals on a regular schedule.  Lessen how much alcohol you drink.  Lessen how much caffeine you drink, or stop drinking caffeine. General instructions  Keep all follow-up visits as told by your doctor. This is important.  Keep a journal to find out if certain things bring on headaches. For example, write down: ? What you eat and drink. ? How much sleep you get. ? Any change to your diet or medicines.  Relax by getting a massage or doing other relaxing activities.  Lessen stress.  Sit up straight. Do not tighten (tense) your muscles.  Do not use  tobacco products. This includes cigarettes, chewing tobacco, or e-cigarettes. If you need help quitting, ask your doctor.  Exercise regularly as told by your doctor.  Get enough sleep. This often means 7-9 hours of sleep. Contact a doctor if:  Your symptoms are not helped by medicine.  You have a headache that feels different than the other headaches.  You feel sick to your stomach (nauseous) or you throw up (vomit).  You have a fever. Get help right away if:  Your headache becomes really bad.  You keep throwing up.  You have a stiff neck.  You have trouble seeing.  You have trouble speaking.  You have pain in the eye or ear.  Your muscles are weak or you lose muscle control.  You lose your balance or have trouble walking.  You feel like you will pass out (faint) or you pass out.  You have confusion. This information is not intended to replace advice given to you by your health care provider. Make sure you discuss any questions you have with your health care provider. Document Released: 08/23/2008 Document Revised: 04/21/2016 Document Reviewed: 03/09/2015 Elsevier Interactive Patient Education  2018 [employer].    IF you received an x-ray today, you will receive an invoice from [facility]. Please contact [facility] at [phone] with questions or concerns regarding your invoice.   IF you received labwork today, you will receive an invoice from [facility]. Please contact [facility]  at [phone] with questions or concerns regarding your invoice.   Our billing staff will not be able to assist you with questions regarding bills from these companies.  You will be contacted with the lab results as soon as they are available. The fastest way to get your results is to activate your My Chart account. Instructions are located on the last page of this paperwork. If you have not heard from [place] regarding the results in 2 weeks, please contact this office.

## 2017-05-24 NOTE — Progress Notes (Deleted)
Douglas Knight  MRN: [id number] DOB: 06-Feb-1986  Subjective:  Douglas Knight is a 31 y.o. male seen in office today for a chief complaint of headache x 2 week.   Patient seen in ED 6/13, 6/16, and 6/24 for headache, HTN and urinary retention respectively.     PCP is Dr. Ree Kida at [facility].   Review of Systems  Patient Active Problem List   Diagnosis Date Noted  . Acute tension headache 05/15/2017  . Generalized anxiety disorder 05/15/2017  . Colon polyp 12/07/2016  . Irritable bowel syndrome 09/02/2016  . External hemorrhoid 09/02/2016  . Rectal fissure 09/02/2016  . Allergic rhinitis 04/29/2016  . Migraine 04/29/2016  . Vitamin D deficiency 11/17/2015  . Insomnia 07/27/2015  . Acne 03/09/2015  . Essential hypertension 02/27/2015  . Elevated serum creatinine 12/31/2014  . Fatigue 12/29/2014  . Preventative health care 12/29/2014  . Major depressive disorder, recurrent episode, moderate ([place]) 12/29/2014  . Chronic ITP (idiopathic thrombocytopenia) ([facility]CC) 12/29/2014    Current Outpatient Prescriptions on File Prior to Visit  Medication Sig Dispense Refill  . ALPRAZolam (XANAX) 1 MG tablet Take 1 tablet (1 mg total) by mouth 2 (two) times daily as needed for anxiety. 20 tablet 0  . amLODipine (NORVASC) 10 MG tablet Take 1 tablet (10 mg total) by mouth daily. 90 tablet 3  . butalbital-acetaminophen-caffeine (FIORICET/CODEINE) 50-325-40-30 MG capsule 1 or 2 po q6h prn headache (Patient taking differently: Take 1-2 capsules by mouth every 6 (six) hours as needed for headache. ) 15 capsule 0  . docusate sodium (COLACE) 100 MG capsule Take 1 capsule (100 mg total) by mouth 2 (two) times daily. (Patient taking differently: Take 100 mg by mouth 2 (two) times daily as needed (constipation). ) 60 capsule 2  . gabapentin (NEURONTIN) 100 MG capsule Take 200 mg by mouth See admin instructions. Take 2 capsules (200 mg) by mouth three times daily - morning, noon and evening (take  800 mg tablet at bedtime)  5  . gabapentin (NEURONTIN) 800 MG tablet Take 800 mg by mouth at bedtime.  5  . hydrochlorothiazide (MICROZIDE) 12.5 MG capsule Take 1 capsule (12.5 mg total) by mouth daily. 15 capsule 0  . hyoscyamine (LEVSIN, ANASPAZ) 0.125 MG tablet TAKE 1 TABLET BY MOUTH EVERY 4 HOURS AS NEEDED (Patient taking differently: TAKE 1 TABLET BY MOUTH EVERY 4 HOURS AS NEEDED FOR UNCONTROLLED DIARRHEA) 60 tablet 2  . omeprazole (PRILOSEC) 20 MG capsule Take 2 capsules (40 mg total) by mouth daily. (Patient taking differently: Take 40 mg by mouth daily as needed (reflux). ) 14 capsule 0  . promethazine (PHENERGAN) 12.5 MG tablet Take 1 tablet (12.5 mg total) by mouth every 8 (eight) hours as needed for nausea or vomiting. 20 tablet 0  . SUMAtriptan (IMITREX) 25 MG tablet Take 1 tablet (25 mg total) by mouth every 2 (two) hours as needed for migraine. May repeat in 2 hours if headache persists or recurs. 10 tablet 0  . zolpidem (AMBIEN) 10 MG tablet Take 10 mg by mouth at bedtime.  3   No current facility-administered medications on file prior to visit.     Allergies  Allergen Reactions  . Sulfa Antibiotics Anaphylaxis, Hives and Other (See Comments)    Stomach pain  . Wellbutrin [Bupropion] Shortness Of Breath     Objective:  BP 134/81 (BP Location: Right Arm, Patient Position: Sitting, Cuff Size: Normal)   Pulse 98   Temp 98.3 F (36.8 C) (Oral)   Resp  18   Ht 5' 9.13" (1.756 m)   Wt 169 lb 6.4 oz (76.8 kg)   SpO2 95%   BMI 24.92 kg/m   Physical Exam  Assessment and Plan :   There are no diagnoses linked to this encounter.   Tenna Delaine PA-C  [facility] 05/24/2017 2:47 PM

## 2017-05-24 NOTE — Telephone Encounter (Signed)
Attempted to call patient re: labs.  LVM to call our office back.  His creatinine is still slightly elevated and not back to baseline.  I recommend that he continue to drink fluids, avoid NSAIDs (motrin/ aleve, etc) and follow up with urology as scheduled.  He should plan to see Dr Mingo Amber in the next week or so for recheck of labs/ follow up on symptoms.  Ashly M. Lajuana Ripple, DO PGY-3, [facility]

## 2017-05-24 NOTE — Telephone Encounter (Signed)
Pt was advised. ep °

## 2017-05-24 NOTE — Progress Notes (Signed)
Douglas Knight  MRN: [id number] DOB: Mar 28, 1986  Subjective:  Douglas Knight is a 31 y.o. male who presents for evaluation of headache. Symptoms began about 2 weeks ago. Generally, the headaches last about all day and occur every day. The headaches are usually pounding and squeezing and are bandlike extending to occiput.  The patient rates his most severe headaches a 7 on a scale from 1 to 10. Recently, the headaches have been stable. Work attendance or other daily activities are affected by the headaches. Precipitating factors include: none which have been determined but does note he did discontinue transcranial magnetic stimulation two weeks ago before these headaches. He received these tx's 5 days a week for two weeks from psychiatrist, Dr. Albertine Patricia. The headaches are usually not preceded by an aura. Associated symptoms are photophobia, phonophobia, nausea, depression and worsening school/work performance. The patient denies dizziness, loss of balance, numbness of extremities, speech difficulties, vision problems and vomiting in the early morning. Home treatment has included resting, sleeping and fioricet with codeine, tylenol, and imitrex with little improvement. He was seen by ED 3 times for this and by the family [facility] 3 times over the past two weeks. Most recent head CT on 05/16/17 stable slight prominence of the pineal gland and Mild mucosal thickening in several ethmoid air cells noted.  Last visit with [facility] was on 05/23/17. Pt was recommend to continue Rx medication.  Given referral to headache clinic.  Other history includes: migraines, HTN,  and allergic rhinitis. Family history includes no known family members with significant headaches. He notes he is getting adequate amount of sleep and drinking at least 64 oz of water daily. Pt presents today because he notes he feels as if there are "too many hands stirring the pot" at the [facility] and states they were supposed to  refer him to the headache clinic but they did not place the referral.    Review of Systems  Constitutional: Positive for fatigue. Negative for chills, diaphoresis and fever.  Respiratory: Negative for cough and shortness of breath.   Gastrointestinal: Positive for nausea. Negative for abdominal pain.    Patient Active Problem List   Diagnosis Date Noted  . Acute tension headache 05/15/2017  . Generalized anxiety disorder 05/15/2017  . Colon polyp 12/07/2016  . Irritable bowel syndrome 09/02/2016  . External hemorrhoid 09/02/2016  . Rectal fissure 09/02/2016  . Allergic rhinitis 04/29/2016  . Migraine 04/29/2016  . Vitamin D deficiency 11/17/2015  . Insomnia 07/27/2015  . Acne 03/09/2015  . Essential hypertension 02/27/2015  . Elevated serum creatinine 12/31/2014  . Fatigue 12/29/2014  . Preventative health care 12/29/2014  . Major depressive disorder, recurrent episode, moderate ([place]) 12/29/2014  . Chronic ITP (idiopathic thrombocytopenia) (HCC) 12/29/2014    Current Outpatient Prescriptions on File Prior to Visit  Medication Sig Dispense Refill  . ALPRAZolam (XANAX) 1 MG tablet Take 1 tablet (1 mg total) by mouth 2 (two) times daily as needed for anxiety. 20 tablet 0  . amLODipine (NORVASC) 10 MG tablet Take 1 tablet (10 mg total) by mouth daily. 90 tablet 3  . butalbital-acetaminophen-caffeine (FIORICET/CODEINE) 50-325-40-30 MG capsule 1 or 2 po q6h prn headache (Patient taking differently: Take 1-2 capsules by mouth every 6 (six) hours as needed for headache. ) 15 capsule 0  . docusate sodium (COLACE) 100 MG capsule Take 1 capsule (100 mg total) by mouth 2 (two) times daily. (Patient taking differently: Take 100 mg by mouth 2 (two) times  daily as needed (constipation). ) 60 capsule 2  . gabapentin (NEURONTIN) 100 MG capsule Take 200 mg by mouth See admin instructions. Take 2 capsules (200 mg) by mouth three times daily - morning, noon and evening (take 800 mg tablet at bedtime)   5  . gabapentin (NEURONTIN) 800 MG tablet Take 800 mg by mouth at bedtime.  5  . hydrochlorothiazide (MICROZIDE) 12.5 MG capsule Take 1 capsule (12.5 mg total) by mouth daily. 15 capsule 0  . hyoscyamine (LEVSIN, ANASPAZ) 0.125 MG tablet TAKE 1 TABLET BY MOUTH EVERY 4 HOURS AS NEEDED (Patient taking differently: TAKE 1 TABLET BY MOUTH EVERY 4 HOURS AS NEEDED FOR UNCONTROLLED DIARRHEA) 60 tablet 2  . omeprazole (PRILOSEC) 20 MG capsule Take 2 capsules (40 mg total) by mouth daily. (Patient taking differently: Take 40 mg by mouth daily as needed (reflux). ) 14 capsule 0  . promethazine (PHENERGAN) 12.5 MG tablet Take 1 tablet (12.5 mg total) by mouth every 8 (eight) hours as needed for nausea or vomiting. 20 tablet 0  . SUMAtriptan (IMITREX) 25 MG tablet Take 1 tablet (25 mg total) by mouth every 2 (two) hours as needed for migraine. May repeat in 2 hours if headache persists or recurs. 10 tablet 0  . zolpidem (AMBIEN) 10 MG tablet Take 10 mg by mouth at bedtime.  3   No current facility-administered medications on file prior to visit.     Allergies  Allergen Reactions  . Sulfa Antibiotics Anaphylaxis, Hives and Other (See Comments)    Stomach pain  . Wellbutrin [Bupropion] Shortness Of Breath       Social History   Social History  . Marital status: Single    Spouse name: N/A  . Number of children: 0  . Years of education: N/A   Occupational History  . social worker/addiction specialist    Social History Main Topics  . Smoking status: Never Smoker  . Smokeless tobacco: Never Used  . Alcohol use 0.0 oz/week     Comment: rarely  . Drug use: No  . Sexual activity: Not on file   Other Topics Concern  . Not on file   Social History Narrative   01/2015 Currently working on Masters of SW at [employer]    Objective:  BP 134/81 (BP Location: Right Arm, Patient Position: Sitting, Cuff Size: Normal)   Pulse 98   Temp 98.3 F (36.8 C) (Oral)   Resp 18   Ht 5' 9.13" (1.756 m)   Wt 169  lb 6.4 oz (76.8 kg)   SpO2 95%   BMI 24.92 kg/m   Physical Exam  Constitutional: He is oriented to person, place, and time and well-developed, well-nourished, and in no distress.  HENT:  Head: Normocephalic and atraumatic.  Right Ear: Tympanic membrane, external ear and ear canal normal.  Left Ear: Tympanic membrane, external ear and ear canal normal.  Mouth/Throat: Uvula is midline, oropharynx is clear and moist and mucous membranes are normal.  Eyes: Conjunctivae and EOM are normal. Pupils are equal, round, and reactive to light.  Neck: Normal range of motion.  Cardiovascular: Normal rate, regular rhythm and normal heart sounds.   Pulmonary/Chest: Effort normal.  Neurological: He is alert and oriented to person, place, and time. He has normal motor skills, normal sensation, normal strength, normal reflexes and intact cranial nerves. He has a normal Finger-Nose-Finger Test, a normal Heel to [employer], a normal Romberg Test and a normal Tandem Gait Test. He shows no pronator drift. Gait  normal.  Skin: Skin is warm and dry.  Psychiatric: Affect normal.  Vitals reviewed.  Assessment and Plan :   1. Intractable tension-type headache, unspecified chronicity pattern No acute neuro findings on exam. Unclear etiology to patient's current headache. His bp is controlled in office today. Due to pt's elevated creatinine of 1.44 and the fact that he drove himself to our clinic today, we cannot give our typical headache tx regimen of Toradol and phenergan. Pt reassured that the referral to the headache clinic was placed by the [facility] and that he will be contacted directly to schedule an appointment. Encouraged him to keep a headache diary while awaiting appointment with headache clinic. Instructed to go home, rest, take prescribed medication, and follow up with [facility] as needed. Given strict ED precautions.  2. Migraine without aura and without status migrainosus, not  intractable This particular episode does not appear to be consistent with patient's typical migraine. However, pt is out of imitrex so I will refill it for future need.  - SUMAtriptan (IMITREX) 25 MG tablet; Take 1 tablet (25 mg total) by mouth every 2 (two) hours as needed for migraine. May repeat in 2 hours if headache persists or recurs.  Dispense: 10 tablet; Refill: 0  Tenna Delaine, PA-C  [facility] [place] 05/26/2017 2:54 PM

## 2017-05-26 ENCOUNTER — Other Ambulatory Visit: Payer: Self-pay | Admitting: Family Medicine

## 2017-05-26 ENCOUNTER — Encounter: Payer: Self-pay | Admitting: Internal Medicine

## 2017-05-26 ENCOUNTER — Ambulatory Visit: Payer: BLUE CROSS/BLUE SHIELD | Admitting: Internal Medicine

## 2017-05-26 ENCOUNTER — Ambulatory Visit (INDEPENDENT_AMBULATORY_CARE_PROVIDER_SITE_OTHER): Payer: BLUE CROSS/BLUE SHIELD | Admitting: Internal Medicine

## 2017-05-26 VITALS — BP 162/98 | HR 75 | Temp 98.1°F | Wt 173.0 lb

## 2017-05-26 DIAGNOSIS — G44201 Tension-type headache, unspecified, intractable: Secondary | ICD-10-CM

## 2017-05-26 DIAGNOSIS — I10 Essential (primary) hypertension: Secondary | ICD-10-CM | POA: Diagnosis not present

## 2017-05-26 MED ORDER — PROPRANOLOL HCL 40 MG PO TABS: 40.0000 mg | ORAL_TABLET | Freq: Two times a day (BID) | ORAL | 0 refills | Status: DC

## 2017-05-26 MED ORDER — KETOROLAC TROMETHAMINE 30 MG/ML IJ SOLN
30.0000 mg | Freq: Once | INTRAMUSCULAR | Status: AC
  Administered 2017-05-26: 30 mg via INTRAMUSCULAR

## 2017-05-26 MED ORDER — PROMETHAZINE HCL 12.5 MG PO TABS: 12.5000 mg | ORAL_TABLET | Freq: Three times a day (TID) | ORAL | 0 refills | Status: DC | PRN

## 2017-05-26 MED ORDER — BACLOFEN 10 MG PO TABS: 10.0000 mg | ORAL_TABLET | Freq: Three times a day (TID) | ORAL | 0 refills | Status: DC

## 2017-05-26 NOTE — Progress Notes (Signed)
Subjective:    Douglas Knight - 31 y.o. male MRN [id number]  Date of birth: [birth year]  HPI  Mickle Campton is here for SDA for headaches.  Headaches: Patient reports continuous headache for about the past two weeks. He has been seen at [facility], [facility], and the ED several times for this complaint. Has a history of migraines but reports that this headache is different as it is located mostly in the back of the head and neck with radiation to the shoulders. He does have some frontal pain as well. Has associated nausea without vomiting, relieved with promethazine, as well as fatigue. No fevers, joint swelling, vision changes, or focal weakness. He has tried Tramadol but reports this caused urinary retention. He has also tried Imitrex without relief. Fioricet helps him sleep but does not abort the pain. Received a migraine cocktail in ED which "knocked him out" but when he awoke pain was still present although somewhat dulled. Tried massage last night without significant pain relief.   HTN: Reports compliance with Amlodipine 10 mg and HCTZ 12.5 mg daily. Monitors BP at home at least once per day and reports recent BPs have been 150s/90s. Denies chest pain and SOB.    -  reports that he has never smoked. He has never used smokeless tobacco. - Review of Systems: Per HPI. - Past Medical History: Patient Active Problem List   Diagnosis Date Noted  . Acute tension headache 05/15/2017  . Generalized anxiety disorder 05/15/2017  . Colon polyp 12/07/2016  . Irritable bowel syndrome 09/02/2016  . External hemorrhoid 09/02/2016  . Rectal fissure 09/02/2016  . Allergic rhinitis 04/29/2016  . Migraine 04/29/2016  . Vitamin D deficiency 11/17/2015  . Insomnia 07/27/2015  . Acne 03/09/2015  . Essential hypertension 02/27/2015  . Elevated serum creatinine 12/31/2014  . Fatigue 12/29/2014  . Preventative health care 12/29/2014  . Major depressive disorder, recurrent episode, moderate ([place]) 12/29/2014    . Chronic ITP (idiopathic thrombocytopenia) (HCC) 12/29/2014   - Medications: reviewed and updated   Objective:   Physical Exam BP (!) 162/98   Pulse 75   Temp 98.1 F (36.7 C) (Oral)   Wt 173 lb (78.5 kg)   SpO2 98%   BMI 25.45 kg/m  Gen: NAD, alert, cooperative with exam, appears tired but non-toxic HEENT: NCAT, PERRL, clear conjunctiva, oropharynx clear, supple neck CV: RRR, good S1/S2, no murmur Resp: CTABL, no wheezes, non-labored MSK: Sitting with hunched over posture. Significant TTP in cervical paraspinal muscles and trapezius muscles bilaterally. ROM neck is intact but painful.  Neuro: CN II-XII intact. Strength 5/5 in all extremities. Sensation of extremities intact and equal. Gait normal.     Assessment & Plan:   1. Acute intractable tension-type headache Patient presenting with persistent headache. Recent CT head unremarkable and neuro exam benign. Referral has been placed for headache clinic. Patient has appointment for September but is also on a cancellation wait list. Headache seems to have a muscular tension type component to it given significant TTP and posturing present in exam room. Will attempt trial of Baclofen (avoided other muscle relaxants given recent h/o urinary retention). Patient has a mild AKI but GFR still >60 so will give low dose of Toradol as well.  - baclofen (LIORESAL) 10 MG tablet; Take 1 tablet (10 mg total) by mouth 3 (three) times daily.  Dispense: 30 each; Refill: 0 - promethazine (PHENERGAN) 12.5 MG tablet; Take 1 tablet (12.5 mg total) by mouth every 8 (eight) hours as needed for  nausea or vomiting.  Dispense: 20 tablet; Refill: 0 - propranolol (INDERAL) 40 MG tablet; Take 1 tablet (40 mg total) by mouth 2 (two) times daily.  Dispense: 60 tablet; Refill: 0 - ketorolac (TORADOL) 30 MG/ML injection 30 mg; Inject 1 mL (30 mg total) into the muscle once. -recommended two-three days of consistent Tylenol use after clinic administration of Toradol in  an effort to break headache cycle  -follow up with PCP as needed   2. Essential hypertension BP elevated in office today and per patient, has been elevated on home readings. Pain may be factor in BP elevation. Elected to add B-blocker to regimen as opposed to increasing HCTZ dose in an effort to control BP as well as to help prevent future headaches.  - propranolol (INDERAL) 40 MG tablet; Take 1 tablet (40 mg total) by mouth 2 (two) times daily.  Dispense: 60 tablet; Refill: 0 -continue to monitor BP daily at home  -follow up with PCP next week for BP check   Phill Myron, D.O. 05/26/2017, 11:19 AM PGY-2, [place]

## 2017-05-26 NOTE — Patient Instructions (Signed)
I have prescribed Propranolol which can help prevent headaches and also lowers blood pressure. Baclofen is a muscle relaxer that may relieve some of the pain in your neck and shoulders. I would recommend taking Tylenol consistently for the next couple of days to try to break the cycle of headaches after your Toradol injection. Follow up with you PCP as needed.

## 2017-05-29 ENCOUNTER — Ambulatory Visit: Payer: BLUE CROSS/BLUE SHIELD | Admitting: Obstetrics and Gynecology

## 2017-06-01 NOTE — Progress Notes (Deleted)
   Subjective:   Patient ID: Douglas Knight    DOB: [birth year], 31 y.o. male   MRN: [id number]  CC: "***"  HPI: Douglas Knight is a 31 y.o. male who presents to clinic today ***. Problems discussed today are as follows:  ***: *** ROS: ***  ***Seen 6/29 for h/a. Also had elevated BP. BB added to HCTZ opposed to increasing HCTZ. Propranolol. Given dose of toradol in office and told to take tylenol. Also given baclofen and phenergan.  Complete ROS performed, see HPI for pertinent.  [place]: HTN, migraines, allergic rhinitis, IBS, acne, chornic ITP, vit D deficiency, MDD, GAD. Smoking status reviewed. Medications reviewed.  Objective:   There were no vitals taken for this visit. Vitals and nursing note reviewed.  General: well nourished, well developed, in no acute distress with non-toxic appearance HEENT: normocephalic, atraumatic, moist mucous membranes Neck: supple, non-tender without lymphadenopathy CV: regular rate and rhythm without murmurs, rubs, or gallops, no lower extremity edema Lungs: clear to auscultation bilaterally with normal work of breathing Abdomen: soft, non-tender, non-distended, no masses or organomegaly palpable, normoactive bowel sounds Skin: warm, dry, no rashes or lesions, cap refill < 2 seconds Extremities: warm and well perfused, normal tone  Assessment & Plan:   No problem-specific Assessment & Plan notes found for this encounter.  No orders of the defined types were placed in this encounter.  No orders of the defined types were placed in this encounter.   Douglas Knight, [place], PGY-2 06/01/2017 1:34 PM

## 2017-06-02 ENCOUNTER — Encounter: Payer: Self-pay | Admitting: Family Medicine

## 2017-06-02 ENCOUNTER — Ambulatory Visit (INDEPENDENT_AMBULATORY_CARE_PROVIDER_SITE_OTHER): Payer: BLUE CROSS/BLUE SHIELD | Admitting: Family Medicine

## 2017-06-02 ENCOUNTER — Ambulatory Visit: Payer: BLUE CROSS/BLUE SHIELD | Admitting: Family Medicine

## 2017-06-02 VITALS — BP 132/90 | HR 82 | Temp 98.8°F | Ht 70.0 in | Wt 172.6 lb

## 2017-06-02 DIAGNOSIS — G44201 Tension-type headache, unspecified, intractable: Secondary | ICD-10-CM

## 2017-06-02 DIAGNOSIS — R51 Headache: Secondary | ICD-10-CM

## 2017-06-02 DIAGNOSIS — R519 Headache, unspecified: Secondary | ICD-10-CM

## 2017-06-02 MED ORDER — KETOROLAC TROMETHAMINE 30 MG/ML IJ SOLN
30.0000 mg | Freq: Once | INTRAMUSCULAR | Status: AC
  Administered 2017-06-02: 30 mg via INTRAMUSCULAR

## 2017-06-02 NOTE — Assessment & Plan Note (Signed)
Continued headache despite phenergan, PRN imitrex, baclofen and tylenol.  Will be following up with headache clinic in September.  Neuro exam normal and no FND.  Headache likely medication induced.  Would have preferred to prescribe naproxen 500mg  BID x2 weeks, but patient has significant elevations in creatinine.  He did request IM toradol one dose.  Ultimately patient may improve once he gets off medication and follows up at headache clinic that may consider course of DHE.  - IM toradol 30mg  - continue baclofen, tylenol, phenergan PRN nausea and imitrex PRN <10 doses per month - return precautions discussed with patient - follow up at headache clinic

## 2017-06-02 NOTE — Patient Instructions (Signed)
You were seen today for continued headache.  I believe this headache may be due to overmedicating.  I am recommending that you stop taking any kind of NSAID (naproxen, ibuprofen).  Normally I would recommend naproxen (NSAID for 2 weeks) while you stop all other medication, but your kidney function is worsening at this time.  This might be due to continued medication use.   I provided you with a Toradol shot at your request.  You can continue the baclofen, phenergan for nausea, propranolol and tylenol as needed.   Please follow up with the headache clinic on 08/14/17.    Nice meeting you, Douglas Knight. Rosalyn Gess, [place] Medicine Resident PGY-2 06/02/2017 5:17 PM

## 2017-06-02 NOTE — Progress Notes (Signed)
    Subjective:  Douglas Knight is a 31 y.o. male who presents to the [facility] today with a chief complaint of headache HPI:  Intractable headache: Patient with history of irritable bowel syndrome, major depressive disorder, insomnia, fatigue, generalized anxiety disorder and multiple recent visits for intractable headache presenting today for headache. Was most recently seen by Dr. Juleen China who felt his headache was muscular/tension-type headache and prescribed baclofen, propranolol, phenergan, shot of toradol and scheduled tylenol. Does have associated nausea without vomiting. No changes in vision, weakness or other FND.    PMH: see above Tobacco use: never used Medication: reviewed and updated ROS: see HPI   Objective:  Physical Exam: BP 132/90   Pulse 82   Temp 98.8 F (37.1 C) (Oral)   Ht 5\' 10"  (1.778 m)   Wt 172 lb 9.6 oz (78.3 kg)   SpO2 98%   BMI 24.77 kg/m   Gen: 31yo M who appears uncomfortable, but in NAD HEENT: AT/[place], EOMI, PERRL, no nasal drainage CV: RRR with no murmurs appreciated Pulm: NWOB, CTAB with no crackles, wheezes, or rhonchi GI: Normal bowel sounds present. Soft, Nontender, Nondistended. MSK: no edema, cyanosis, or clubbing noted Skin: warm, dry Neuro: CN 2-12 WNL, no changes in sensation, strength 5/5 throughout, negative rhomberg Psych: Normal affect and thought content  No results found for this or any previous visit (from the past 72 hour(s)).   Assessment/Plan:  Acute tension headache Continued headache despite phenergan, PRN imitrex, baclofen and tylenol.  Will be following up with headache clinic in September.  Neuro exam normal and no FND.  Headache likely medication induced.  Would have preferred to prescribe naproxen 500mg  BID x2 weeks, but patient has significant elevations in creatinine.  He did request IM toradol one dose.  Ultimately patient may improve once he gets off medication and follows up at headache clinic that may consider course  of DHE.  - IM toradol 30mg  - continue baclofen, tylenol, phenergan PRN nausea and imitrex PRN <10 doses per month - return precautions discussed with patient - follow up at headache clinic

## 2017-06-14 DIAGNOSIS — G43719 Chronic migraine without aura, intractable, without status migrainosus: Secondary | ICD-10-CM | POA: Diagnosis not present

## 2017-06-14 DIAGNOSIS — Z049 Encounter for examination and observation for unspecified reason: Secondary | ICD-10-CM | POA: Diagnosis not present

## 2017-06-14 DIAGNOSIS — G43019 Migraine without aura, intractable, without status migrainosus: Secondary | ICD-10-CM | POA: Diagnosis not present

## 2017-06-14 DIAGNOSIS — G43901 Migraine, unspecified, not intractable, with status migrainosus: Secondary | ICD-10-CM | POA: Diagnosis not present

## 2017-06-16 ENCOUNTER — Other Ambulatory Visit: Payer: Self-pay | Admitting: Nurse Practitioner

## 2017-06-16 DIAGNOSIS — R1013 Epigastric pain: Secondary | ICD-10-CM | POA: Diagnosis not present

## 2017-06-16 DIAGNOSIS — R1011 Right upper quadrant pain: Secondary | ICD-10-CM | POA: Diagnosis not present

## 2017-06-20 ENCOUNTER — Other Ambulatory Visit: Payer: Self-pay | Admitting: *Deleted

## 2017-06-20 DIAGNOSIS — G44201 Tension-type headache, unspecified, intractable: Secondary | ICD-10-CM

## 2017-06-20 MED ORDER — PROMETHAZINE HCL 12.5 MG PO TABS: 12.5000 mg | ORAL_TABLET | Freq: Three times a day (TID) | ORAL | 0 refills | Status: DC | PRN

## 2017-06-21 ENCOUNTER — Ambulatory Visit
Admission: RE | Admit: 2017-06-21 | Discharge: 2017-06-21 | Disposition: A | Discharge status: Home / Self Care | Payer: BLUE CROSS/BLUE SHIELD | Source: Ambulatory Visit | Attending: Nurse Practitioner | Admitting: Nurse Practitioner

## 2017-06-21 DIAGNOSIS — R1011 Right upper quadrant pain: Secondary | ICD-10-CM

## 2017-06-23 ENCOUNTER — Encounter: Payer: Self-pay | Admitting: Family Medicine

## 2017-06-23 ENCOUNTER — Ambulatory Visit (INDEPENDENT_AMBULATORY_CARE_PROVIDER_SITE_OTHER): Payer: BLUE CROSS/BLUE SHIELD | Admitting: Family Medicine

## 2017-06-23 VITALS — BP 118/76 | HR 76 | Temp 98.1°F | Ht 68.0 in | Wt 165.8 lb

## 2017-06-23 DIAGNOSIS — R5383 Other fatigue: Secondary | ICD-10-CM

## 2017-06-23 DIAGNOSIS — G44201 Tension-type headache, unspecified, intractable: Secondary | ICD-10-CM | POA: Diagnosis not present

## 2017-06-23 DIAGNOSIS — I10 Essential (primary) hypertension: Secondary | ICD-10-CM | POA: Diagnosis not present

## 2017-06-23 DIAGNOSIS — K582 Mixed irritable bowel syndrome: Secondary | ICD-10-CM | POA: Diagnosis not present

## 2017-06-23 DIAGNOSIS — F331 Major depressive disorder, recurrent, moderate: Secondary | ICD-10-CM

## 2017-06-23 DIAGNOSIS — M791 Myalgia, unspecified site: Secondary | ICD-10-CM

## 2017-06-23 NOTE — Assessment & Plan Note (Signed)
Now becoming more chronic Believe this is likely somatic in nature.  Awaiting full other workup before diagnosis of somatoform disorder made.  Continue with headache clinic

## 2017-06-23 NOTE — Assessment & Plan Note (Signed)
BP good today despite only being on inderal.   Follow.  Checking creatinine today.

## 2017-06-23 NOTE — Assessment & Plan Note (Signed)
Likely contributing to weight loss and chronic nausea.  By my view, his weight has been fluctating but nothing alarming or steady

## 2017-06-23 NOTE — Assessment & Plan Note (Signed)
Believe this is contributing to most of his other symptoms. Continue following with psych/counseling. Again, see fatigue for concern for somatic complaints.

## 2017-06-23 NOTE — Progress Notes (Signed)
Subjective:    Douglas Knight is a 31 y.o. male who presents to [facility] today for headaches and BP issues:  1.  Headaches:  Ongoing issue since "early June" by patient report.  With associated nausea, dry heaving, reported 20 lb weight loss.  Has been evaluated by Headache Clinic last week.  Told not migraines, stop imitrex, likely secondary to "other systemic issues" like blood pressure.  No abdominal pain.  Still taking daily inderal for migraine PPX.  States headache is dull, aching, bandlike pressure around his head "all the time."  No acute worsening recently.  No changes in vision/hearing.  No auras.  2.  BP issues:  States this runs 150 - 180 outside of the office.  Admits it may be related to anxiety but is still worried about this.  No chest pain, dyspnea, palpitations.  Has been good when seen at physicians' offices.  NOT taking the medications listed below for BP.    Follows with both psychiatrist and psychologist.    ROS as above per HPI.    The following portions of the patient's history were reviewed and updated as appropriate: allergies, current medications, past medical history, family and social history, and problem list. Patient is a nonsmoker.    PMH reviewed.  Past Medical History:  Diagnosis Date  . Anal fissure   . Anxiety   . Depression   . Hypertension   . IBS (irritable bowel syndrome)   . ITP (idiopathic thrombocytopenic purpura)    Past Surgical History:  Procedure Laterality Date  . APPENDECTOMY  2003   [facility]    Medications reviewed. Current Outpatient Prescriptions  Medication Sig Dispense Refill  . ALPRAZolam (XANAX) 1 MG tablet Take 1 tablet (1 mg total) by mouth 2 (two) times daily as needed for anxiety. 20 tablet 0  . amLODipine (NORVASC) 10 MG tablet Take 1 tablet (10 mg total) by mouth daily. 90 tablet 3  . baclofen (LIORESAL) 10 MG tablet Take 1 tablet (10 mg total) by mouth 3 (three) times daily. 30 each 0  .  butalbital-acetaminophen-caffeine (FIORICET/CODEINE) 50-325-40-30 MG capsule 1 or 2 po q6h prn headache (Patient taking differently: Take 1-2 capsules by mouth every 6 (six) hours as needed for headache. ) 15 capsule 0  . docusate sodium (COLACE) 100 MG capsule Take 1 capsule (100 mg total) by mouth 2 (two) times daily. (Patient taking differently: Take 100 mg by mouth 2 (two) times daily as needed (constipation). ) 60 capsule 2  . gabapentin (NEURONTIN) 100 MG capsule Take 200 mg by mouth See admin instructions. Take 2 capsules (200 mg) by mouth three times daily - morning, noon and evening (take 800 mg tablet at bedtime)  5  . gabapentin (NEURONTIN) 800 MG tablet Take 800 mg by mouth at bedtime.  5  . hydrochlorothiazide (MICROZIDE) 12.5 MG capsule Take 1 capsule (12.5 mg total) by mouth daily. 15 capsule 0  . hyoscyamine (LEVSIN, ANASPAZ) 0.125 MG tablet TAKE 1 TABLET BY MOUTH EVERY 4 HOURS AS NEEDED (Patient taking differently: TAKE 1 TABLET BY MOUTH EVERY 4 HOURS AS NEEDED FOR UNCONTROLLED DIARRHEA) 60 tablet 2  . omeprazole (PRILOSEC) 20 MG capsule Take 2 capsules (40 mg total) by mouth daily. (Patient taking differently: Take 40 mg by mouth daily as needed (reflux). ) 14 capsule 0  . promethazine (PHENERGAN) 12.5 MG tablet Take 1 tablet (12.5 mg total) by mouth every 8 (eight) hours as needed for nausea or vomiting. 20 tablet 0  .  propranolol (INDERAL) 40 MG tablet Take 1 tablet (40 mg total) by mouth 2 (two) times daily. 60 tablet 0  . SUMAtriptan (IMITREX) 25 MG tablet Take 1 tablet (25 mg total) by mouth every 2 (two) hours as needed for migraine. May repeat in 2 hours if headache persists or recurs. 10 tablet 0  . zolpidem (AMBIEN) 10 MG tablet Take 10 mg by mouth at bedtime.  3   No current facility-administered medications for this visit.      Objective:   Physical Exam BP 118/76   Pulse 76   Temp 98.1 F (36.7 C) (Oral)   Ht 5\' 8"  (1.727 m)   Wt 165 lb 12.8 oz (75.2 kg)   SpO2  92%   BMI 25.21 kg/m  Gen:  Alert, cooperative patient who appears stated age in no acute distress.  Vital signs reviewed. Head: [place]/AT.   Eyes:  EOMI, PERRL.   Ears:  External ears WNL, Bilateral TM's normal without retraction, redness or bulging. Nose:  Septum midline  Mouth:  MMM, tonsils non-erythematous, non-edematous.   Neck:  No thyromegaly  Cardiac:  Regular rate and rhythm  Pulm:  Clear to auscultation bilaterally with good air movement.  No wheezes or rales noted.   Abd:  Soft/nondistended/nontender.   Exts: Non edematous BL  LE, warm and well perfused.  Neuro:  CN II - XII intact.  Strength/sensation 5/5 BL upper and lower extremities.    No results found for this or any previous visit (from the past 72 hour(s)).

## 2017-06-23 NOTE — Patient Instructions (Signed)
It was good to meet you today!  We are checking some labs today.  I'll let you know the results.  Come back and see me in about 2 weeks.  We can go over everything then.  If you need anything before then let me know

## 2017-06-23 NOTE — Assessment & Plan Note (Signed)
Has never had full rheum/connective tissue d/o. Likely negative, but will check basic labs to rule out. If negative, will discuss somatization with patient.  FU in 4 weeks.

## 2017-06-24 LAB — COMPREHENSIVE METABOLIC PANEL
ALT: 20 IU/L (ref 0–44)
AST: 19 IU/L (ref 0–40)
Albumin/Globulin Ratio: 2.2 (ref 1.2–2.2)
Albumin: 4.8 g/dL (ref 3.5–5.5)
Alkaline Phosphatase: 112 IU/L (ref 39–117)
BUN/Creatinine Ratio: 8 — ABNORMAL LOW (ref 9–20)
BUN: 10 mg/dL (ref 6–20)
Bilirubin Total: 0.5 mg/dL (ref 0.0–1.2)
CO2: 25 mmol/L (ref 20–29)
CREATININE: 1.22 mg/dL (ref 0.76–1.27)
Calcium: 9.4 mg/dL (ref 8.7–10.2)
Chloride: 103 mmol/L (ref 96–106)
GFR calc non Af Amer: 78 mL/min/{1.73_m2} (ref >59)
GFR, EST AFRICAN AMERICAN: 90 mL/min/{1.73_m2} (ref >59)
GLOBULIN, TOTAL: 2.2 g/dL (ref 1.5–4.5)
Glucose: 102 mg/dL — ABNORMAL HIGH (ref 65–99)
Potassium: 4.2 mmol/L (ref 3.5–5.2)
SODIUM: 144 mmol/L (ref 134–144)
TOTAL PROTEIN: 7 g/dL (ref 6.0–8.5)

## 2017-06-24 LAB — CBC WITH DIFFERENTIAL/PLATELET
BASOS: 0 %
Basophils Absolute: 0 10*3/uL (ref 0.0–0.2)
EOS (ABSOLUTE): 0.1 10*3/uL (ref 0.0–0.4)
EOS: 2 %
HEMATOCRIT: 44.5 % (ref 37.5–51.0)
Hemoglobin: 15.6 g/dL (ref 13.0–17.7)
IMMATURE GRANS (ABS): 0 10*3/uL (ref 0.0–0.1)
IMMATURE GRANULOCYTES: 0 %
Lymphocytes Absolute: 1.1 10*3/uL (ref 0.7–3.1)
Lymphs: 25 %
MCH: 32.4 pg (ref 26.6–33.0)
MCHC: 35.1 g/dL (ref 31.5–35.7)
MCV: 93 fL (ref 79–97)
MONOCYTES: 8 %
MONOS ABS: 0.4 10*3/uL (ref 0.1–0.9)
NEUTROS PCT: 65 %
Neutrophils Absolute: 2.9 10*3/uL (ref 1.4–7.0)
Platelets: 137 10*3/uL — ABNORMAL LOW (ref 150–379)
RBC: 4.81 x10E6/uL (ref 4.14–5.80)
RDW: 14.9 % (ref 12.3–15.4)
WBC: 4.5 10*3/uL (ref 3.4–10.8)

## 2017-06-24 LAB — ANA: ANA: NEGATIVE

## 2017-06-24 LAB — RHEUMATOID FACTOR: Rhuematoid fact SerPl-aCnc: <10 IU/mL (ref 0.0–13.9)

## 2017-06-24 LAB — CK: CK TOTAL: 39 U/L (ref 24–204)

## 2017-06-24 LAB — C-REACTIVE PROTEIN: CRP: 0.6 mg/L (ref 0.0–4.9)

## 2017-06-24 LAB — SEDIMENTATION RATE: Sed Rate: 2 mm/hr (ref 0–15)

## 2017-07-01 DIAGNOSIS — F39 Unspecified mood [affective] disorder: Secondary | ICD-10-CM | POA: Diagnosis not present

## 2017-07-06 ENCOUNTER — Other Ambulatory Visit: Payer: Self-pay | Admitting: Family Medicine

## 2017-07-06 DIAGNOSIS — G44201 Tension-type headache, unspecified, intractable: Secondary | ICD-10-CM

## 2017-07-10 ENCOUNTER — Telehealth: Payer: Self-pay | Admitting: *Deleted

## 2017-07-10 NOTE — Telephone Encounter (Signed)
LVm for pt to call office back.  He is scheduled to see Dr. Mingo Amber on 07/17/17 and he is not actually here that day and we need to reschedule him.  If he calls back please inform him and assist him in scheduling another appointment and please block Dr. Mingo Amber spot at 3:50pm so no one else schedules in that spot. Katharina Caper, Armel Rabbani D, [place]

## 2017-07-11 NOTE — Telephone Encounter (Signed)
LVm for pt to call back to tell him below and assist in rescheduling his appointment. Katharina Caper, Conrad Zajkowski D, [place]

## 2017-07-12 ENCOUNTER — Encounter: Payer: Self-pay | Admitting: *Deleted

## 2017-07-12 NOTE — Telephone Encounter (Signed)
LVm for pt to call back to inform him of below and assist him in getting this rescheduled. Katharina Caper, Deontra Pereyra D, [place]

## 2017-07-17 ENCOUNTER — Ambulatory Visit (INDEPENDENT_AMBULATORY_CARE_PROVIDER_SITE_OTHER): Payer: BLUE CROSS/BLUE SHIELD | Admitting: Student in an Organized Health Care Education/Training Program

## 2017-07-17 ENCOUNTER — Encounter: Payer: Self-pay | Admitting: Student in an Organized Health Care Education/Training Program

## 2017-07-17 ENCOUNTER — Ambulatory Visit: Payer: BLUE CROSS/BLUE SHIELD | Admitting: Family Medicine

## 2017-07-17 DIAGNOSIS — F331 Major depressive disorder, recurrent, moderate: Secondary | ICD-10-CM

## 2017-07-17 DIAGNOSIS — Z79899 Other long term (current) drug therapy: Secondary | ICD-10-CM

## 2017-07-17 NOTE — Patient Instructions (Signed)
It was a pleasure seeing you today in our clinic. Today we discussed your new therapy with your psychiatrist. Here is the treatment plan we have discussed and agreed upon together:   - Sumatriptan and Emsam cannot be taken together. Please discontinue sumatriptin. - avoid phenergan - we will follow your blood pressure closely  - please schedule follow up with Dr. Mingo Amber  Our clinic's number is [phone]. Please call with questions or concerns about what we discussed today.  Be well, Dr. Burr Medico

## 2017-07-17 NOTE — Progress Notes (Signed)
   CC: psych starting MAOI, med rec  HPI: Douglas Knight is a 31 y.o. male with PMH significant for MDD, IBS, migraine who presents to [facility] today for follow up. He was previously seen by his PCP for fatigue and weight loss, which were thought to possibly be due to somatization disorder. Rheum labs were drawn at that time for rule-out and came back negative.  Fatigue - recent rheum/connective tissue disorder rule out negative  - endorses poor appetitie which has been chronic.  Psych to start MAOI Patient reports that his psychiatrist is planning to start an MAOI Emsum patch, and he would like everyone to be on the same page with this medication.    Review of Symptoms:  See HPI for ROS.   CC, SH/smoking status, and VS noted.  Objective: BP 116/64   Pulse 73   Temp 98.1 F (36.7 C) (Oral)   Ht 5\' 8"  (1.727 m)   Wt 162 lb 3.2 oz (73.6 kg)   SpO2 95%   BMI 24.66 kg/m  GEN: NAD, alert, cooperative, and pleasant. PSYCH: AAOx3, appropriate affect  Assessment and plan:  Medication management - Medication list reviewed for meds that are contraindicated with MAOI - sumatrix is contraindicated - patient advised to discontinue this medication and to not take it with MAOI - antihypertensive agents may compound hypertension with MAOI- will have to follow BP closely when this medication is started - advised to use phenergan sparingly - patient voiced understanding and agreement with this plan - advised to follow up with PCP  Chronic Fatigue/Dec Appetite/History of MDD Discussed somatization with depressive symptoms including headache, decreased appetite and generalized fatigue. This is a possibility given that patient's rheumatology screening labs came back negative. Patient to follow up with PCP for further discussion and management of this chronic problem.  Precepted with Dr. Alroy Bailiff, MD,MS,  PGY2 07/19/2017 7:38 PM

## 2017-07-19 NOTE — Assessment & Plan Note (Signed)
Discussed somatization with depressive symptoms including headache, decreased appetite and generalized fatigue. This is a possibility given that patient's rheumatology screening labs came back negative. Patient to follow up with PCP for further discussion and management of this chronic problem.

## 2017-07-19 NOTE — Assessment & Plan Note (Signed)
-   Medication list reviewed for meds that are contraindicated with MAOI - sumatrix is contraindicated - patient advised to discontinue this medication and to not take it with MAOI - antihypertensive agents may compound hypertension with MAOI- will have to follow BP closely when this medication is started - advised to use phenergan sparingly - patient voiced understanding and agreement with this plan - advised to follow up with PCP

## 2017-07-21 ENCOUNTER — Ambulatory Visit (INDEPENDENT_AMBULATORY_CARE_PROVIDER_SITE_OTHER): Payer: BLUE CROSS/BLUE SHIELD | Admitting: Gastroenterology

## 2017-07-21 ENCOUNTER — Encounter: Payer: Self-pay | Admitting: Gastroenterology

## 2017-07-21 VITALS — BP 120/84 | HR 65 | Ht 68.0 in | Wt 164.0 lb

## 2017-07-21 DIAGNOSIS — R1084 Generalized abdominal pain: Secondary | ICD-10-CM

## 2017-07-21 DIAGNOSIS — R110 Nausea: Secondary | ICD-10-CM

## 2017-07-21 DIAGNOSIS — K529 Noninfective gastroenteritis and colitis, unspecified: Secondary | ICD-10-CM

## 2017-07-21 DIAGNOSIS — K625 Hemorrhage of anus and rectum: Secondary | ICD-10-CM

## 2017-07-21 NOTE — Progress Notes (Signed)
     [facility] Progress Note  Chief Complaint: Abdominal pain, diarrhea and rectal bleeding  Subjective  History:  Douglas Knight is here to see me for the first time in about 5 months. He continues to have many somatic complaints including worsening troubles with high blood pressure, headaches, ongoing abdominal pain that is usually lower, intermittent diarrhea and some recent rectal bleeding. I reviewed recent primary care Behavioral Health notes. It seems that he might be starting an MAOI in the near future. His appetite is been variable, sometimes he only eats 1 good meal a day and he is down about 10 pounds in the last 2 months. He has fluctuated in weight over the last year depending upon the severity of his various symptoms. ROS: Cardiovascular:  no chest pain Respiratory: no dyspnea Notable for headaches, chronic fatigue as noted above  The patient's Past Medical, Family and Social History were reviewed and are on file in the EMR.  Objective:  Med list reviewed  Vital signs in last 24 hrs: Vitals:   07/21/17 1602  BP: 120/84  Pulse: 65    Physical Exam  Somewhat depressed affect, but pleasant and conversational  HEENT: sclera anicteric, oral mucosa moist without lesions  Neck: supple, no thyromegaly, JVD or lymphadenopathy  Cardiac: RRR without murmurs, S1S2 heard, no peripheral edema  Pulm: clear to auscultation bilaterally, normal RR and effort noted  Abdomen: soft, no tenderness, with active bowel sounds. No guarding or palpable hepatosplenomegaly.  Skin; warm and dry, no jaundice or rash Rectal: Normal external, normal sphincter tone, no tenderness or fissure or palpable internal lesions Recent Labs:   CBC Latest Ref Rng & Units 06/23/2017 05/21/2017 05/13/2017  WBC 3.4 - 10.8 x10E3/uL 4.5 5.3 8.8  Hemoglobin 13.0 - 17.7 g/dL 15.6 16.2 15.1  Hematocrit 37.5 - 51.0 % 44.5 44.5 42.3  Platelets 150 - 379 x10E3/uL 137(L) 107(L) 107(L)   He has other labs and  imaging for multiple ED visits related to his headaches and high blood pressure   @ASSESSMENTPLANBEGIN @ Assessment: Encounter Diagnoses  Name Primary?  . Generalized abdominal pain Yes  . Chronic diarrhea   . Rectal bleeding   . Nausea without vomiting    I agree with his other caregivers that he has a variety of somatic symptoms that seem related to his underlying affective disorder. 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  His rectal bleeding is clearly benign and related to recent constipation. He cannot tolerate more than a small dose of hyoscyamine because it causes constipation. I offered him Zofran, but he is unable to take it since it seems to worsen his headaches. He takes Phenergan as needed, but can only do so at night or on the weekend because it makes him tired.   I am afraid I don't have anything else to offer him at this time other than reassurance. I would happily see him as needed.  Total time 15 minutes, over half spent in counseling and coordination of care.   Nelida Meuse III

## 2017-07-21 NOTE — Patient Instructions (Signed)
If you are age 32 or older, your body mass index should be between 23-30. Your Body mass index is 24.94 kg/m. If this is out of the aforementioned range listed, please consider follow up with your Primary Care Provider.  If you are age 64 or younger, your body mass index should be between 19-25. Your Body mass index is 24.94 kg/m. If this is out of the aformentioned range listed, please consider follow up with your Primary Care Provider.   Follow up as needed.  Thank you for choosing [facility]  Dr Wilfrid Lund III

## 2017-07-22 DIAGNOSIS — F39 Unspecified mood [affective] disorder: Secondary | ICD-10-CM | POA: Diagnosis not present

## 2017-08-14 ENCOUNTER — Encounter: Payer: Self-pay | Admitting: Family Medicine

## 2017-08-14 ENCOUNTER — Ambulatory Visit (INDEPENDENT_AMBULATORY_CARE_PROVIDER_SITE_OTHER): Payer: BLUE CROSS/BLUE SHIELD | Admitting: Family Medicine

## 2017-08-14 VITALS — BP 122/78 | HR 66 | Temp 98.3°F | Ht 68.0 in | Wt 165.2 lb

## 2017-08-14 DIAGNOSIS — R0683 Snoring: Secondary | ICD-10-CM | POA: Diagnosis not present

## 2017-08-14 DIAGNOSIS — F331 Major depressive disorder, recurrent, moderate: Secondary | ICD-10-CM | POA: Diagnosis not present

## 2017-08-14 DIAGNOSIS — G44201 Tension-type headache, unspecified, intractable: Secondary | ICD-10-CM | POA: Diagnosis not present

## 2017-08-14 DIAGNOSIS — I10 Essential (primary) hypertension: Secondary | ICD-10-CM | POA: Diagnosis not present

## 2017-08-14 DIAGNOSIS — R5383 Other fatigue: Secondary | ICD-10-CM

## 2017-08-14 MED ORDER — PROPRANOLOL HCL 40 MG PO TABS: 40.0000 mg | ORAL_TABLET | Freq: Every day | ORAL | 0 refills | Status: DC

## 2017-08-14 NOTE — Patient Instructions (Signed)
It was good to see you again today.  I'm going to refer you for a sleep study. They will contact you in the next week or so to schedule that appointment.  I will contact you with the results.   You can make a follow-up appointment to see me after the sleep study.  Have a good rest of the week!

## 2017-08-14 NOTE — Progress Notes (Signed)
Subjective:    Douglas Knight is a 31 y.o. male who presents to [facility] today for hypertension:  1.  HTN:  Much improved since moving out of his girlfriend's house. Blood pressure outside of here has been much improved.  He is only taking 1 propranolol a day and uses for migraine prophylaxis and not actually hypertension.  Since his hypertension is improved as daily chronic headache has also resolved. He has had no migraines for the past several months at least.    2.  Depression:  Improved from last visit. Main complaint today is fatigue. He still has some joint pains and other symptoms which she recognizes a somatic in nature. He has moved out from his girlfriend and is living with his parents and has good support there. He feels this is provided him with a better outlook and has helped a lot with his mood disorder. He has also had some changes in his medications as prescribed by his psychiatrist. He has noticed an improvement in his mood disorder with this as well.  No suicidal homicidal ideation.  ROS as above per HPI.    The following portions of the patient's history were reviewed and updated as appropriate: allergies, current medications, past medical history, family and social history, and problem list. Patient is a nonsmoker.    PMH reviewed.  Past Medical History:  Diagnosis Date  . Anal fissure   . Anxiety   . Depression   . Hypertension   . IBS (irritable bowel syndrome)   . ITP (idiopathic thrombocytopenic purpura)    Past Surgical History:  Procedure Laterality Date  . APPENDECTOMY  2003   [facility]    Medications reviewed. Current Outpatient Prescriptions  Medication Sig Dispense Refill  . ALPRAZolam (XANAX) 1 MG tablet Take 1 tablet (1 mg total) by mouth 2 (two) times daily as needed for anxiety. 20 tablet 0  . docusate sodium (COLACE) 100 MG capsule Take 1 capsule (100 mg total) by mouth 2 (two) times daily. (Patient taking differently: Take by  mouth daily as needed for mild constipation. ) 60 capsule 2  . EMSAM 6 MG/24HR Apply 1 PATCH TO SKIN EVERY DAY - rotate sites  1  . gabapentin (NEURONTIN) 100 MG capsule Take 200 mg by mouth See admin instructions. Take 2 capsules (200 mg) by mouth three times daily - morning, noon and evening (take 800 mg tablet at bedtime)  5  . gabapentin (NEURONTIN) 800 MG tablet Take 800 mg by mouth at bedtime.  5  . hyoscyamine (LEVSIN, ANASPAZ) 0.125 MG tablet TAKE 1 TABLET BY MOUTH EVERY 4 HOURS AS NEEDED (Patient taking differently: TAKE 1 TABLET BY MOUTH EVERY 4 HOURS AS NEEDED FOR UNCONTROLLED DIARRHEA) 60 tablet 2  . omeprazole (PRILOSEC) 20 MG capsule Take 2 capsules (40 mg total) by mouth daily. (Patient taking differently: Take 40 mg by mouth daily as needed (reflux). ) 14 capsule 0  . promethazine (PHENERGAN) 12.5 MG tablet Take 1 tablet (12.5 mg total) by mouth every 8 (eight) hours as needed for nausea or vomiting. 20 tablet 0  . propranolol (INDERAL) 40 MG tablet Take 1 tablet (40 mg total) by mouth 2 (two) times daily. 60 tablet 0  . Suvorexant (BELSOMRA) 20 MG TABS Take 20 mg by mouth at bedtime.     No current facility-administered medications for this visit.      Objective:   Physical Exam BP 122/78   Pulse 66   Temp 98.3 F (36.8  C) (Oral)   Ht 5\' 8"  (1.727 m)   Wt 165 lb 3.2 oz (74.9 kg)   SpO2 96%   BMI 25.12 kg/m  Gen:  Alert, cooperative patient who appears stated age in no acute distress.  Vital signs reviewed. HEENT: EOMI,  MMM Cardiac:  Regular rate and rhythm without murmur auscultated.  Good S1/S2. Pulm:  Clear to auscultation bilaterally with good air movement.   Psych:  Still somewhat flat affect. However he does smile spontaneously more often. Not quite as depressed appearing. No hallucinations.  No results found for this or any previous visit (from the past 72 hour(s)). ]

## 2017-08-16 NOTE — Assessment & Plan Note (Signed)
Improved. He has had some updates as medication as prescribed by his psychiatrist. Does not have his medications with him today. Continue to follow with psychiatry. I'll see him back in about a month.

## 2017-08-16 NOTE — Assessment & Plan Note (Signed)
Controlled currently. Improved with lifestyle improvements. No change in propranolol. Hold off on any further medication management this time.

## 2017-08-16 NOTE — Assessment & Plan Note (Signed)
Persists. Had a home sleep study. Interesting in having in lab sleep study. He was intolerant to CPAP mask previously and would like to try CPAP nasal cannula/below if possible and he qualifies.

## 2017-09-07 ENCOUNTER — Ambulatory Visit: Payer: BLUE CROSS/BLUE SHIELD | Admitting: Family Medicine

## 2017-09-08 ENCOUNTER — Ambulatory Visit: Payer: BLUE CROSS/BLUE SHIELD | Admitting: Internal Medicine

## 2017-09-18 ENCOUNTER — Ambulatory Visit: Payer: BLUE CROSS/BLUE SHIELD | Admitting: Family Medicine

## 2017-09-21 ENCOUNTER — Ambulatory Visit (INDEPENDENT_AMBULATORY_CARE_PROVIDER_SITE_OTHER): Payer: BLUE CROSS/BLUE SHIELD

## 2017-09-21 ENCOUNTER — Ambulatory Visit (HOSPITAL_COMMUNITY)
Admission: EM | Admit: 2017-09-21 | Discharge: 2017-09-21 | Disposition: A | Discharge status: Home / Self Care | Payer: BLUE CROSS/BLUE SHIELD | Attending: Family Medicine | Admitting: Family Medicine

## 2017-09-21 ENCOUNTER — Encounter (HOSPITAL_COMMUNITY): Payer: Self-pay | Admitting: Emergency Medicine

## 2017-09-21 DIAGNOSIS — I10 Essential (primary) hypertension: Secondary | ICD-10-CM

## 2017-09-21 DIAGNOSIS — R0602 Shortness of breath: Secondary | ICD-10-CM | POA: Diagnosis not present

## 2017-09-21 DIAGNOSIS — G43009 Migraine without aura, not intractable, without status migrainosus: Secondary | ICD-10-CM | POA: Diagnosis not present

## 2017-09-21 DIAGNOSIS — G43809 Other migraine, not intractable, without status migrainosus: Secondary | ICD-10-CM

## 2017-09-21 MED ORDER — KETOROLAC TROMETHAMINE 60 MG/2ML IM SOLN
60.0000 mg | Freq: Once | INTRAMUSCULAR | Status: AC
  Administered 2017-09-21: 60 mg via INTRAMUSCULAR

## 2017-09-21 MED ORDER — DEXAMETHASONE SODIUM PHOSPHATE 10 MG/ML IJ SOLN
INTRAMUSCULAR | Status: AC
  Filled 2017-09-21: qty 1

## 2017-09-21 MED ORDER — DEXAMETHASONE SODIUM PHOSPHATE 10 MG/ML IJ SOLN
10.0000 mg | Freq: Once | INTRAMUSCULAR | Status: AC
  Administered 2017-09-21: 10 mg via INTRAMUSCULAR

## 2017-09-21 MED ORDER — METOCLOPRAMIDE HCL 5 MG/ML IJ SOLN
INTRAMUSCULAR | Status: AC
  Filled 2017-09-21: qty 2

## 2017-09-21 MED ORDER — SUMATRIPTAN SUCCINATE 6 MG/0.5ML ~~LOC~~ SOLN
6.0000 mg | Freq: Once | SUBCUTANEOUS | Status: AC
  Administered 2017-09-21: 6 mg via SUBCUTANEOUS

## 2017-09-21 MED ORDER — SUMATRIPTAN SUCCINATE 6 MG/0.5ML ~~LOC~~ SOLN
SUBCUTANEOUS | Status: AC
  Filled 2017-09-21: qty 0.5

## 2017-09-21 MED ORDER — METOCLOPRAMIDE HCL 5 MG/ML IJ SOLN
5.0000 mg | Freq: Once | INTRAMUSCULAR | Status: AC
  Administered 2017-09-21: 5 mg via INTRAMUSCULAR

## 2017-09-21 MED ORDER — KETOROLAC TROMETHAMINE 60 MG/2ML IM SOLN
INTRAMUSCULAR | Status: AC
  Filled 2017-09-21: qty 2

## 2017-09-21 NOTE — ED Triage Notes (Signed)
Pt sts htn with hx of same and HA and nausea today

## 2017-09-22 DIAGNOSIS — F319 Bipolar disorder, unspecified: Secondary | ICD-10-CM | POA: Diagnosis not present

## 2017-09-25 ENCOUNTER — Inpatient Hospital Stay (HOSPITAL_COMMUNITY)
Admission: AD | Admit: 2017-09-25 | Discharge: 2017-10-01 | DRG: 885 | Disposition: A | Discharge status: Home / Self Care | Payer: BLUE CROSS/BLUE SHIELD | Attending: Psychiatry | Admitting: Psychiatry

## 2017-09-25 ENCOUNTER — Encounter (HOSPITAL_COMMUNITY): Payer: Self-pay | Admitting: *Deleted

## 2017-09-25 DIAGNOSIS — G4700 Insomnia, unspecified: Secondary | ICD-10-CM | POA: Diagnosis present

## 2017-09-25 DIAGNOSIS — Z79899 Other long term (current) drug therapy: Secondary | ICD-10-CM

## 2017-09-25 DIAGNOSIS — R454 Irritability and anger: Secondary | ICD-10-CM | POA: Diagnosis not present

## 2017-09-25 DIAGNOSIS — I10 Essential (primary) hypertension: Secondary | ICD-10-CM | POA: Diagnosis present

## 2017-09-25 DIAGNOSIS — Z915 Personal history of self-harm: Secondary | ICD-10-CM | POA: Diagnosis not present

## 2017-09-25 DIAGNOSIS — Z818 Family history of other mental and behavioral disorders: Secondary | ICD-10-CM

## 2017-09-25 DIAGNOSIS — Z888 Allergy status to other drugs, medicaments and biological substances status: Secondary | ICD-10-CM

## 2017-09-25 DIAGNOSIS — F411 Generalized anxiety disorder: Secondary | ICD-10-CM | POA: Diagnosis present

## 2017-09-25 DIAGNOSIS — F332 Major depressive disorder, recurrent severe without psychotic features: Secondary | ICD-10-CM | POA: Diagnosis not present

## 2017-09-25 DIAGNOSIS — F419 Anxiety disorder, unspecified: Secondary | ICD-10-CM | POA: Diagnosis not present

## 2017-09-25 DIAGNOSIS — K589 Irritable bowel syndrome without diarrhea: Secondary | ICD-10-CM | POA: Diagnosis present

## 2017-09-25 DIAGNOSIS — F515 Nightmare disorder: Secondary | ICD-10-CM | POA: Diagnosis not present

## 2017-09-25 DIAGNOSIS — Z810 Family history of intellectual disabilities: Secondary | ICD-10-CM | POA: Diagnosis not present

## 2017-09-25 DIAGNOSIS — Z882 Allergy status to sulfonamides status: Secondary | ICD-10-CM | POA: Diagnosis not present

## 2017-09-25 DIAGNOSIS — F39 Unspecified mood [affective] disorder: Secondary | ICD-10-CM | POA: Diagnosis not present

## 2017-09-25 MED ORDER — ALPRAZOLAM 1 MG PO TABS
1.0000 mg | ORAL_TABLET | Freq: Two times a day (BID) | ORAL | Status: DC | PRN
  Administered 2017-09-25: 1 mg via ORAL
  Filled 2017-09-25: qty 1

## 2017-09-25 MED ORDER — MIRTAZAPINE 15 MG PO TABS
15.0000 mg | ORAL_TABLET | Freq: Every day | ORAL | Status: DC
  Administered 2017-09-25: 15 mg via ORAL
  Filled 2017-09-25 (×4): qty 1

## 2017-09-25 MED ORDER — ALUM & MAG HYDROXIDE-SIMETH 200-200-20 MG/5ML PO SUSP: 30.0000 mL | ORAL | Status: DC | PRN

## 2017-09-25 MED ORDER — GABAPENTIN 100 MG PO CAPS
200.0000 mg | ORAL_CAPSULE | Freq: Three times a day (TID) | ORAL | Status: DC
  Administered 2017-09-26 – 2017-09-29 (×11): 200 mg via ORAL
  Filled 2017-09-25 (×17): qty 2

## 2017-09-25 MED ORDER — GABAPENTIN 800 MG PO TABS
800.0000 mg | ORAL_TABLET | Freq: Every day | ORAL | Status: DC
  Administered 2017-09-25: 800 mg via ORAL
  Filled 2017-09-25 (×4): qty 1

## 2017-09-25 MED ORDER — HYDROXYZINE HCL 25 MG PO TABS
25.0000 mg | ORAL_TABLET | Freq: Four times a day (QID) | ORAL | Status: DC | PRN
  Administered 2017-09-26 – 2017-09-30 (×5): 25 mg via ORAL
  Filled 2017-09-25 (×4): qty 1

## 2017-09-25 MED ORDER — MAGNESIUM HYDROXIDE 400 MG/5ML PO SUSP: 30.0000 mL | Freq: Every day | ORAL | Status: DC | PRN

## 2017-09-25 MED ORDER — ACETAMINOPHEN 325 MG PO TABS
650.0000 mg | ORAL_TABLET | Freq: Four times a day (QID) | ORAL | Status: DC | PRN
Start: 2017-09-25 — End: 2017-10-01
  Administered 2017-09-25 – 2017-09-30 (×4): 650 mg via ORAL
  Filled 2017-09-25 (×4): qty 2

## 2017-09-25 MED ORDER — PROPRANOLOL HCL 40 MG PO TABS
40.0000 mg | ORAL_TABLET | Freq: Every day | ORAL | Status: DC
  Administered 2017-09-26 – 2017-10-01 (×6): 40 mg via ORAL
  Filled 2017-09-25 (×3): qty 1
  Filled 2017-09-25: qty 4
  Filled 2017-09-25 (×5): qty 1

## 2017-09-25 MED ORDER — ENSURE ENLIVE PO LIQD
237.0000 mL | Freq: Two times a day (BID) | ORAL | Status: DC
  Administered 2017-09-26 – 2017-09-28 (×3): 237 mL via ORAL

## 2017-09-25 MED ORDER — QUETIAPINE FUMARATE 100 MG PO TABS
100.0000 mg | ORAL_TABLET | Freq: Every evening | ORAL | Status: DC | PRN
  Administered 2017-09-25 (×2): 100 mg via ORAL
  Filled 2017-09-25 (×8): qty 1

## 2017-09-25 MED ORDER — PANTOPRAZOLE SODIUM 40 MG PO TBEC
40.0000 mg | DELAYED_RELEASE_TABLET | Freq: Every day | ORAL | Status: DC
Start: 2017-09-26 — End: 2017-10-01
  Administered 2017-09-26 – 2017-10-01 (×6): 40 mg via ORAL
  Filled 2017-09-25 (×9): qty 1

## 2017-09-25 NOTE — Tx Team (Signed)
Initial Treatment Plan 09/25/2017 10:12 PM Douglas Knight [id number]    PATIENT STRESSORS: Financial difficulties Occupational concerns Other: relationship stressors   PATIENT STRENGTHS: Ability for insight Average or above average intelligence Capable of independent living Communication skills General fund of knowledge Physical Health Supportive family/friends Work skills   PATIENT IDENTIFIED PROBLEMS: "working on improving self-worth"  "less negative self talk"     depression  SI             DISCHARGE CRITERIA:  Improved stabilization in mood, thinking, and/or behavior  PRELIMINARY DISCHARGE PLAN: Outpatient therapy Return to previous living arrangement  PATIENT/FAMILY INVOLVEMENT: This treatment plan has been presented to and reviewed with the patient, Douglas Knight.  The patient and family have been given the opportunity to ask questions and make suggestions.  Karie Kirks, [place] 09/25/2017, 10:12 PM

## 2017-09-25 NOTE — H&P (Signed)
Behavioral Health Medical Screening Exam  Douglas Knight is an 31 y.o. male, who is accompanied with his GF, is endorsing per previous completed TTS assessment; suicidal ideations with a plan to overdose and cut his wrists with a knife that he has access to in his home.  Patient stated that the ideations have been ongoing for approximately 6 months.  Patient reported 1 previous attempt in 2007.  Total Time spent with patient: 20 minutes  Psychiatric Specialty Exam: Physical Exam  Constitutional: He is oriented to person, place, and time. He appears well-developed and well-nourished. No distress.  HENT:  Head: Normocephalic.  Eyes: Pupils are equal, round, and reactive to light.  Respiratory: Effort normal and breath sounds normal. No respiratory distress.  Neurological: He is alert and oriented to person, place, and time. No cranial nerve deficit.  Skin: Skin is warm and dry. He is not diaphoretic.  Psychiatric: Judgment normal. His speech is delayed. He is slowed and withdrawn. Cognition and memory are impaired. He exhibits a depressed mood. He expresses suicidal ideation. He expresses suicidal plans.    Review of Systems  Psychiatric/Behavioral: Positive for depression and suicidal ideas. Negative for hallucinations and substance abuse. The patient is nervous/anxious and has insomnia.   All other systems reviewed and are negative.   There were no vitals taken for this visit.There is no height or weight on file to calculate BMI.  General Appearance: Casual  Eye Contact:  Poor  Speech:  Slow  Volume:  Decreased  Mood:  Depressed  Affect:  Congruent  Thought Process:  Goal Directed  Orientation:  Full (Time, Place, and Person)  Thought Content:  Rumination  Suicidal Thoughts:  Yes.  with intent/plan  Homicidal Thoughts:  No  Memory:  Immediate;   Good  Judgement:  Fair  Insight:  Good  Psychomotor Activity:  Negative  Concentration: Concentration: Fair  Recall:  Good  Fund of  Knowledge:Good  Language: Good  Akathisia:  Negative  Handed:  Right  AIMS (if indicated):     Assets:  Desire for Improvement  Sleep:       Musculoskeletal: Strength & Muscle Tone: within normal limits Gait & Station: normal Patient leans: N/A  There were no vitals taken for this visit.  Recommendations:  Based on my evaluation the patient does not appear to have an emergency medical condition.  Laverle Hobby, PA-C 09/25/2017, 8:44 PM

## 2017-09-25 NOTE — BH Assessment (Signed)
Assessment Note  Douglas Knight is an 31 y.o.single male, who voluntarily came into [facility], accompanied by his girlfriend.  Patient reported suicidal ideations with a plan to overdose and cut his wrists with a knife that he has access to in his home.  Patient stated that the ideations have been ongoing for approximately 6 months.  Patient reported 1 previous attempt in 2007.  Patient stated that, in 2007, he made a list of things that he would do to attempt suicide.  Patient reported having recurrent nightmares, during the last 6 months, of some of the acts to commit suicide on the list he made in 2007, such as hanging himself and laying in traffic.   Patient reported ongoing experiences with depressive symptoms, such as despondency, fatigue, isolation, tearfulness, feelings of worthlessness, guilt, loss of interest in previously enjoyable activities, and anger. Patient denies homicidal ideations, auditory/visual hallucinations, self-injurious behaviors, substance use, or access to any other weapons.    Patient reported currently residing with his girlfriend.  Patient identified "everyone" in his family as protective factors.  Patient identified recent stressors associated with him being involved in an affair.  Patient stated he has been upset due to "the lies" being "dishonest." Patient stated that he experienced a loss in weight, of 30lbs, during the previous 4 months. Patient reported no arrests, probation/parole, or upcoming court dates. Patient denies any physical, sexual, or verbal abuse. Patient reported a paternal family history of substance abuse.  Patient stated receiving inpatient treatment for suicidal ideation and depression, in 2007, at [facility].  Patient reported currently seeing a psychiatrist, Dr. Mallie Snooks, and a therapist, Neldon Labella.  Patient stated that he is currently taking Xanax and has been compliant with medication.    During assessment, Patient was  calm and cooperative.  Patient was appropriately dressed in personal clothing.  Patient was oriented to person, time, location, and situation.  Patient's eye contact was poor.  Patient's motor activity consisted of freedom of movement.  Patient's speech was logical, coherent, and soft.  Patient's level of consciousness was alert and consisted of crying.  Patient's mood appeared to be depressed and despaired.  Patient's affect was depressed and appropriate to circumstance.  Patient's thought process was coherent, relevant, and circumstantial.  Patient's judgment appeared to be unimpaired.     Diagnosis: Major depressive disorder, recurrent, severe, without psychotic features  Past Medical History:  Past Medical History:  Diagnosis Date  . Anal fissure   . Anxiety   . Depression   . Hypertension   . IBS (irritable bowel syndrome)   . ITP (idiopathic thrombocytopenic purpura)     Past Surgical History:  Procedure Laterality Date  . APPENDECTOMY  2003   [facility]    Family History:  Family History  Problem Relation Age of Onset  . Diabetes Father   . Hypertension Father   . Colon polyps Father   . Colon polyps Mother   . Heart disease Maternal Grandfather   . Alzheimer's disease Paternal Grandmother   . Depression Paternal Grandfather   . Colon polyps Paternal Grandfather   . Colon cancer Paternal Aunt     Social History:  reports that he has never smoked. He has never used smokeless tobacco. He reports that he drinks alcohol. He reports that he does not use drugs.  Additional Social History:  Alcohol / Drug Use Pain Medications: See MAR Prescriptions: See MAR Over the Counter: See MAR History of alcohol / drug  use?: No history of alcohol / drug abuse Longest period of sobriety (when/how long): None noted.  CIWA:   COWS:    Allergies:  Allergies  Allergen Reactions  . Sulfa Antibiotics Anaphylaxis, Hives and Other (See Comments)    Stomach pain  .  Wellbutrin [Bupropion] Shortness Of Breath    Home Medications:  Medications Prior to Admission  Medication Sig Dispense Refill  . ALPRAZolam (XANAX) 1 MG tablet Take 1 tablet (1 mg total) by mouth 2 (two) times daily as needed for anxiety. 20 tablet 0  . docusate sodium (COLACE) 100 MG capsule Take 1 capsule (100 mg total) by mouth 2 (two) times daily. (Patient taking differently: Take by mouth daily as needed for mild constipation. ) 60 capsule 2  . EMSAM 6 MG/24HR Apply 1 PATCH TO SKIN EVERY DAY - rotate sites  1  . gabapentin (NEURONTIN) 100 MG capsule Take 200 mg by mouth See admin instructions. Take 2 capsules (200 mg) by mouth three times daily - morning, noon and evening (take 800 mg tablet at bedtime)  5  . gabapentin (NEURONTIN) 800 MG tablet Take 800 mg by mouth at bedtime.  5  . hyoscyamine (LEVSIN, ANASPAZ) 0.125 MG tablet TAKE 1 TABLET BY MOUTH EVERY 4 HOURS AS NEEDED (Patient taking differently: TAKE 1 TABLET BY MOUTH EVERY 4 HOURS AS NEEDED FOR UNCONTROLLED DIARRHEA) 60 tablet 2  . omeprazole (PRILOSEC) 20 MG capsule Take 2 capsules (40 mg total) by mouth daily. (Patient taking differently: Take 40 mg by mouth daily as needed (reflux). ) 14 capsule 0  . promethazine (PHENERGAN) 12.5 MG tablet Take 1 tablet (12.5 mg total) by mouth every 8 (eight) hours as needed for nausea or vomiting. 20 tablet 0  . propranolol (INDERAL) 40 MG tablet Take 1 tablet (40 mg total) by mouth daily. 30 tablet 0  . Suvorexant (BELSOMRA) 20 MG TABS Take 20 mg by mouth at bedtime.      OB/GYN Status:  No LMP for male patient.  General Assessment Data Location of Assessment: [facility] Assessment Services TTS Assessment: In system Is this a Tele or Face-to-Face Assessment?: Face-to-Face Is this an Initial Assessment or a Re-assessment for this encounter?: Initial Assessment Marital status: Single Is patient pregnant?: No Pregnancy Status: No Living Arrangements: Spouse/significant other (Pt. reports  living with his girlfriend.) Can pt return to current living arrangement?: Yes Admission Status: Voluntary Is patient capable of signing voluntary admission?: Yes Referral Source: Self/Family/Friend Insurance type: [place] Screening Exam ([place]) Medical Exam completed: Yes  Crisis Care Plan Living Arrangements: Spouse/significant other (Pt. reports living with his girlfriend.) Legal Guardian: Other: (Self) Name of Psychiatrist: Dr. Mallie Snooks Name of Therapist: Neldon Labella  Education Status Is patient currently in school?: No Current Grade: N/A Highest grade of school patient has completed: MSW Name of school: N/A Contact person: N/A  Risk to self with the past 6 months Suicidal Ideation: Yes-Currently Present Has patient been a risk to self within the past 6 months prior to admission? : Yes Suicidal Intent: Yes-Currently Present Has patient had any suicidal intent within the past 6 months prior to admission? : Yes Is patient at risk for suicide?: Yes Suicidal Plan?: Yes-Currently Present Has patient had any suicidal plan within the past 6 months prior to admission? : Yes Specify Current Suicidal Plan: Pt. reported a plan to overdose and cut his wrists with knives. Access to Means: Yes Specify Access to Suicidal Means: Pt. reported having access to knives  in his home. What has been your use of drugs/alcohol within the last 12 months?: Patient denies. Previous Attempts/Gestures: Yes How many times?: 1 (Pt. reports 2007.) Other Self Harm Risks: None noted. Triggers for Past Attempts: Unknown Intentional Self Injurious Behavior: None Family Suicide History: No Recent stressful life event(s): Other (Comment), Financial Problems (Pt. reported being involved in an affair.) Persecutory voices/beliefs?: No Depression: Yes Depression Symptoms: Despondent, Tearfulness, Isolating, Fatigue, Guilt, Loss of interest in usual pleasures, Feeling worthless/self  pity, Feeling angry/irritable Substance abuse history and/or treatment for substance abuse?: No Suicide prevention information given to non-admitted patients: Not applicable  Risk to Others within the past 6 months Homicidal Ideation: No (Patient deneis. ) Does patient have any lifetime risk of violence toward others beyond the six months prior to admission? : No Thoughts of Harm to Others: No Current Homicidal Intent: No Current Homicidal Plan: No Access to Homicidal Means: No Identified Victim: None noted. History of harm to others?: No Assessment of Violence: None Noted Violent Behavior Description: None noted. Does patient have access to weapons?: No Criminal Charges Pending?: No Does patient have a court date: No Is patient on probation?: No  Psychosis Hallucinations: None noted Delusions: None noted  Mental Status Report Appearance/Hygiene: Unremarkable, Other (Comment) (Appropriately dressed in personal clothing.) Eye Contact: Poor Motor Activity: Freedom of movement Speech: Logical/coherent, Soft Level of Consciousness: Alert, Crying Mood: Depressed, Despair Affect: Depressed, Appropriate to circumstance Anxiety Level: Minimal Thought Processes: Coherent, Relevant, Circumstantial Judgement: Unimpaired Orientation: Person, Place, Time, Situation Obsessive Compulsive Thoughts/Behaviors: None  Cognitive Functioning Concentration: Good Memory: Recent Intact, Remote Intact IQ: Average Insight: Fair Impulse Control: Fair Appetite: Poor Weight Loss: 30 (Pt. reports in the previous 4 months.) Weight Gain: 0 Sleep: Decreased Total Hours of Sleep: 4 Vegetative Symptoms: None  ADLScreening [facility] Assessment Services) Patient's cognitive ability adequate to safely complete daily activities?: Yes Patient able to express need for assistance with ADLs?: Yes Independently performs ADLs?: Yes (appropriate for developmental age)  Prior Inpatient Therapy Prior Inpatient  Therapy: Yes Prior Therapy Dates: 2007 Prior Therapy Facilty/Provider(s): [facility] Reason for Treatment: Depression, suicidal ideations  Prior Outpatient Therapy Prior Outpatient Therapy: Yes Prior Therapy Dates: Ongoing Prior Therapy Facilty/Provider(s): Dr. Mallie Snooks, Neldon Labella Reason for Treatment: Depression, Anxiety Does patient have an ACCT team?: No Does patient have Intensive In-House Services?  : No Does patient have [facility] services? : No Does patient have P4CC services?: No  ADL Screening (condition at time of admission) Patient's cognitive ability adequate to safely complete daily activities?: Yes Is the patient deaf or have difficulty hearing?: No Does the patient have difficulty seeing, even when wearing glasses/contacts?: No Does the patient have difficulty concentrating, remembering, or making decisions?: No Patient able to express need for assistance with ADLs?: Yes Does the patient have difficulty dressing or bathing?: No Independently performs ADLs?: Yes (appropriate for developmental age) Does the patient have difficulty walking or climbing stairs?: No Weakness of Legs: None Weakness of Arms/Hands: None  Home Assistive Devices/Equipment Home Assistive Devices/Equipment: None    Abuse/Neglect Assessment (Assessment to be complete while patient is alone) Physical Abuse: Denies Verbal Abuse: Denies Sexual Abuse: Denies Exploitation of patient/patient's resources: Denies Self-Neglect: Denies     Regulatory affairs officer (For Healthcare) Does Patient Have a Medical Advance Directive?: No Would patient like information on creating a medical advance directive?: No - Patient declined    Additional Information 1:1 In Past 12 Months?: No CIRT Risk: No Elopement Risk: No Does patient have medical clearance?:  Yes     Disposition:  Disposition Initial Assessment Completed for this Encounter: Yes Disposition of Patient: Inpatient treatment program  (Per Patriciaann Clan, PA-C) Type of inpatient treatment program: Adult  On Site Evaluation by:  Leroy Sea, LPC/A, LCAS/A Reviewed with Physician:  Patriciaann Clan, PA-C  Marcine Matar 09/25/2017 8:22 PM

## 2017-09-25 NOTE — Progress Notes (Addendum)
Pt is a 31 year old male admitted to [facility] as a walk-in voluntarily for SI with a plan to overdose or cut.  He reports he is here for "thoughts about hurting myself" with plan to "overdose or cut."  He reports he has "been having nightmares for the past 6 months of ways to hurt myself."  He presents with depressed affect and mood.  He speaks slowly at times, and has his head down during most of the admission process.  Pt identifies stressors as: relationship stressors, financial, and job stress.  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  Pt denies HI, denies hallucinations, complains of pain from headache of 6/10.  He endorses "passive" SI at time of admission assessment.  Pt reports he has lost approximately "25 to 30" pounds in about "3 or 4 months."    Introduced self to pt.  Admission process and paperwork completed.  Encouragement and support provided.  Non-invasive body assessment completed, unremarkable.  Belongings searched for contraband and items not allowed on unit are in locker 22.  Pt oriented to unit and unit routine.  Provided with meal and beverage.  Pt provided with scrubs per request.  Medications administered per order.  PRN medication administered for pain.  Urine specimen obtained.  Q15 minute safety checks in place.  Pt is compliant with medications and admission process.  He verbally contracts for safety and reports he will inform staff of needs and concerns.  Will continue to monitor and assess.

## 2017-09-26 DIAGNOSIS — Z915 Personal history of self-harm: Secondary | ICD-10-CM

## 2017-09-26 LAB — BASIC METABOLIC PANEL
ANION GAP: 9 (ref 5–15)
BUN: 17 mg/dL (ref 6–20)
CHLORIDE: 106 mmol/L (ref 101–111)
CO2: 28 mmol/L (ref 22–32)
Calcium: 9.4 mg/dL (ref 8.9–10.3)
Creatinine, Ser: 1.31 mg/dL — ABNORMAL HIGH (ref 0.61–1.24)
Glucose, Bld: 136 mg/dL — ABNORMAL HIGH (ref 65–99)
POTASSIUM: 3 mmol/L — AB (ref 3.5–5.1)
SODIUM: 143 mmol/L (ref 135–145)

## 2017-09-26 LAB — TSH: TSH: 2.482 u[IU]/mL (ref 0.350–4.500)

## 2017-09-26 LAB — RAPID URINE DRUG SCREEN, HOSP PERFORMED
AMPHETAMINES: NOT DETECTED
BENZODIAZEPINES: POSITIVE — AB
Barbiturates: NOT DETECTED
COCAINE: NOT DETECTED
OPIATES: NOT DETECTED
TETRAHYDROCANNABINOL: NOT DETECTED

## 2017-09-26 LAB — HEPATIC FUNCTION PANEL
ALK PHOS: 84 U/L (ref 38–126)
ALT: 21 U/L (ref 17–63)
AST: 18 U/L (ref 15–41)
Albumin: 4.4 g/dL (ref 3.5–5.0)
BILIRUBIN DIRECT: 0.1 mg/dL (ref 0.1–0.5)
BILIRUBIN INDIRECT: 0.8 mg/dL (ref 0.3–0.9)
BILIRUBIN TOTAL: 0.9 mg/dL (ref 0.3–1.2)
TOTAL PROTEIN: 7.3 g/dL (ref 6.5–8.1)

## 2017-09-26 LAB — CBC
HEMATOCRIT: 44 % (ref 39.0–52.0)
Hemoglobin: 15.9 g/dL (ref 13.0–17.0)
MCH: 32.7 pg (ref 26.0–34.0)
MCHC: 36.1 g/dL — ABNORMAL HIGH (ref 30.0–36.0)
MCV: 90.5 fL (ref 78.0–100.0)
Platelets: 150 10*3/uL (ref 150–400)
RBC: 4.86 MIL/uL (ref 4.22–5.81)
RDW: 12 % (ref 11.5–15.5)
WBC: 4.4 10*3/uL (ref 4.0–10.5)

## 2017-09-26 LAB — LIPID PANEL
CHOL/HDL RATIO: 2.3 ratio
CHOLESTEROL: 137 mg/dL (ref 0–200)
HDL: 60 mg/dL (ref >40)
LDL Cholesterol: 53 mg/dL (ref 0–99)
TRIGLYCERIDES: 121 mg/dL (ref <150)
VLDL: 24 mg/dL (ref 0–40)

## 2017-09-26 LAB — HEMOGLOBIN A1C
HEMOGLOBIN A1C: 4.8 % (ref 4.8–5.6)
MEAN PLASMA GLUCOSE: 91.06 mg/dL

## 2017-09-26 MED ORDER — ALPRAZOLAM 0.5 MG PO TABS
0.5000 mg | ORAL_TABLET | Freq: Three times a day (TID) | ORAL | Status: DC | PRN
  Administered 2017-09-26 – 2017-09-27 (×2): 0.5 mg via ORAL
  Filled 2017-09-26 (×2): qty 1

## 2017-09-26 MED ORDER — LAMOTRIGINE 25 MG PO TABS
25.0000 mg | ORAL_TABLET | Freq: Every day | ORAL | Status: DC
  Administered 2017-09-26 – 2017-10-01 (×6): 25 mg via ORAL
  Filled 2017-09-26 (×10): qty 1

## 2017-09-26 MED ORDER — QUETIAPINE FUMARATE 100 MG PO TABS
100.0000 mg | ORAL_TABLET | Freq: Every day | ORAL | Status: DC
  Administered 2017-09-26 – 2017-09-27 (×2): 100 mg via ORAL
  Filled 2017-09-26 (×3): qty 1

## 2017-09-26 NOTE — BHH Counselor (Signed)
Adult Comprehensive Assessment  Patient ID: Douglas Knight, male   DOB: 1986-05-31, 31 y.o.   MRN: [id number]  Information Source: Information source: Patient  Current Stressors:  Educational / Learning stressors: None reported  Employment / Job issues: does not like his place of employment Family Relationships: estranged from brother Museum/gallery curator / Lack of resources (include bankruptcy): None reported Housing / Lack of housing: None reported Physical health (include injuries & life threatening diseases): None reported Social relationships: has been cheating on his current girlfriend because he is not happy in the relationship Substance abuse: None reported Bereavement / Loss: None reported  Living/Environment/Situation:  Living Arrangements: Spouse/significant other Living conditions (as described by patient or guardian): moved out recently, was living with his parents for a couple of months How long has patient lived in current situation?: a few months What is atmosphere in current home: Chaotic  Family History:  Marital status: Long term relationship Long term relationship, how long?: 3-21yrs What types of issues is patient dealing with in the relationship?: Pt feels that he is not happy in the relationship; recently had an affiar, was feeling smothered and taken advantage of financially Does patient have children?: No  Childhood History:  By whom was/is the patient raised?: Both parents Description of patient's relationship with caregiver when they were a child: father owned his own business so he was gone often for work- but did have a bond with him; closer to mother as a child Patient's description of current relationship with people who raised him/her: close with both parents; they are supportive Does patient have siblings?: Yes Number of Siblings: 2 Description of patient's current relationship with siblings: older sister- gets along well; younger brother- lives distant;  doesn't talk to him often Did patient suffer any verbal/emotional/physical/sexual abuse as a child?: No Did patient suffer from severe childhood neglect?: No Has patient ever been sexually abused/assaulted/raped as an adolescent or adult?: No Was the patient ever a victim of a crime or a disaster?: No Witnessed domestic violence?: No Has patient been effected by domestic violence as an adult?: No  Education:  Highest grade of school patient has completed: Conservator, museum/gallery in Social Work Currently a Ship broker?: No Name of school: N/A Sport and exercise psychologist person: N/A Learning disability?: No  Employment/Work Situation:   Employment situation: Employed Where is patient currently employed?: Engineer, mining How long has patient been employed?: 2.63yrs Patient's job has been impacted by current illness: No What is the longest time patient has a held a job?: 5.45yrs Where was the patient employed at that time?: [employer] Social research officer, government Has patient ever been in the [employer]?: No Has patient ever served in combat?: No Did You Receive Any Psychiatric Treatment/Services While in Passenger transport manager?: No Are There Guns or Other Weapons in [place]?: No  Financial Resources:   Financial resources: Income from employment, Private insurance Does patient have a representative payee or guardian?: No  Alcohol/Substance Abuse:   What has been your use of drugs/alcohol within the last 12 months?: Pt denies If attempted suicide, did drugs/alcohol play a role in this?: No Alcohol/Substance Abuse Treatment Hx: Denies past history Has alcohol/substance abuse ever caused legal problems?: No  Social Support System:   Heritage manager System: Good Describe Community Support System: parents are very supportive, sister is supportive; has supportive friends; his two girlfriends are supportive as he goes for help Type of faith/religion: "thinks there is a power greater than him" How does patient's faith help to cope  with current illness?: "I  haven't learned how to do that"  Leisure/Recreation:   Leisure and Hobbies: theatre, watching television, watching basketball  Strengths/Needs:   What things does the patient do well?: good at his job, good actor In what areas does patient struggle / problems for patient: doesn't give himself credit, change, dominant personalities  Discharge Plan:   Does patient have access to transportation?: Yes Will patient be returning to same living situation after discharge?: No Plan for living situation after discharge: plans to go inpatient at [facility] Currently receiving community mental health services: Yes (From Whom) (Dr. Mallie Snooks [facility])- meds; [place] [employer], private practice- therapy) If no, would patient like referral for services when discharged?: Yes (What county?) (started the admission process at [facility]; has phone screening on 10/31; scheduled for admission assessment on 11/2) Does patient have financial barriers related to discharge medications?: No  Summary/Recommendations:     Patient is a 31 year old male with a diagnosis of Major Depressive Disorder. Pt presented to the hospital with thoughts of suicide. Pt reports primary trigger(s) for admission include relational conflict, dissatisfaction at his job, and nightmares related to suicide. Patient will benefit from crisis stabilization, medication evaluation, group therapy and psycho education in addition to case management for discharge planning. At discharge it is recommended that Pt remain compliant with established discharge plan and continued treatment.   [place] Lighter. 09/26/2017

## 2017-09-26 NOTE — Progress Notes (Signed)
Recreation Therapy Notes  Animal-Assisted Activity (AAA) Program Checklist/Progress Notes Patient Eligibility Criteria Checklist & Daily Group note for Rec TxIntervention  Date: 10.30.2018 Time: 2:45pm Location: [address] [employer]   AAA/T Program Assumption of Risk Form signed by Patient/ or Parent Legal Guardian Yes  Patient is free of allergies or sever asthma Yes  Patient reports no fear of animals Yes  Patient reports no history of cruelty to animals Yes  Patient understands his/her participation is voluntary Yes  Patient washes hands before animal contact Yes  Patient washes hands after animal contact Yes  Behavioral Response: Appropriate   Education:Hand Washing, Appropriate Animal Interaction   Education Outcome: Acknowledges education.   Clinical Observations/Feedback: Patient attended session and interacted appropriately with therapy dog and peers.   Laureen Ochs Gloriann Riede, LRT/CTRS        Xolani Degracia L 09/26/2017 3:02 PM
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
## 2017-09-26 NOTE — BHH Group Notes (Signed)
LCSW Group Therapy Note  09/26/2017 1:15pm  Type of Therapy/Topic:  Group Therapy:  Feelings about Diagnosis  Participation Level:  None   Description of Group:   This group will allow patients to explore their thoughts and feelings about diagnoses they have received. Patients will be guided to explore their level of understanding and acceptance of these diagnoses. Facilitator will encourage patients to process their thoughts and feelings about the reactions of others to their diagnosis and will guide patients in identifying ways to discuss their diagnosis with significant others in their lives. This group will be process-oriented, with patients participating in exploration of their own experiences, giving and receiving support, and processing challenge from other group members.   Therapeutic Goals: 1. Patient will demonstrate understanding of diagnosis as evidenced by identifying two or more symptoms of the disorder 2. Patient will be able to express two feelings regarding the diagnosis 3. Patient will demonstrate their ability to communicate their needs through discussion and/or role play  Summary of Patient Progress: Pt was withdrawn and did not participate in group discussion.     Therapeutic Modalities:   Cognitive Behavioral Therapy Brief Therapy Feelings Identification    Gladstone Lighter, LCSW 09/26/2017 4:15 PM

## 2017-09-26 NOTE — BHH Group Notes (Signed)
Pt attended spiritual care group on grief and loss facilitated by chaplain Jerene Pitch   Group opened with brief discussion and psycho-social ed around grief and loss in relationships and in relation to self - identifying life patterns, circumstances, changes that cause losses. Established group norm of speaking from own life experience. Group goal of establishing open and affirming space for members to share loss and experience with grief, normalize grief experience and provide psycho social education and grief support.  Group engaged in facilitated discussion around experience of grief, discussed tasks of mourning with acknowledgement of where these fit into their own journey.   Group facilitation drew on Pastoral care, Adlerian, Narrative, and psycho-dynamic group theories.    Alioune was present throughout group.  Presented with flat affect, inward posture, low voice volume when prompted to introduce at group intro.  [place] did not engage in group discussion.  Remained in room throughout group process.   WL / [place], MDiv

## 2017-09-26 NOTE — BHH Suicide Risk Assessment (Signed)
[place] INPATIENT:  Family/Significant Other Suicide Prevention Education  Suicide Prevention Education:  Patient Refusal for Family/Significant Other Suicide Prevention Education: The patient Douglas Knight has refused to provide written consent for family/significant other to be provided Family/Significant Other Suicide Prevention Education during admission and/or prior to discharge.  Physician notified.  Gladstone Lighter 09/26/2017, 1:21 PM

## 2017-09-26 NOTE — Progress Notes (Signed)
NUTRITION ASSESSMENT  Pt identified as at risk on the Malnutrition Screen Tool  INTERVENTION: 1.. Supplements: Continue Ensure Enlive po BID, each supplement provides 350 kcal and 20 grams of protein  NUTRITION DIAGNOSIS: Unintentional weight loss related to sub-optimal intake as evidenced by pt report.   Goal: Pt to meet >/= 90% of their estimated nutrition needs.  Monitor:  PO intake  Assessment:  Pt admitted with SI. Pt reports 25-30 lb of weight loss over 4 months. Pt has lost 14 lb since 6/18 (8% wt loss x 4 months, insignificant for time frame). Pt has been ordered Ensure supplements, will continue order.   Height: Ht Readings from Last 1 Encounters:  09/25/17 5\' 10"  (1.778 m)    Weight: Wt Readings from Last 1 Encounters:  09/25/17 166 lb (75.3 kg)    Weight Hx: Wt Readings from Last 10 Encounters:  09/25/17 166 lb (75.3 kg)  08/14/17 165 lb 3.2 oz (74.9 kg)  07/21/17 164 lb (74.4 kg)  07/17/17 162 lb 3.2 oz (73.6 kg)  06/23/17 165 lb 12.8 oz (75.2 kg)  06/02/17 172 lb 9.6 oz (78.3 kg)  05/26/17 173 lb (78.5 kg)  05/24/17 169 lb 6.4 oz (76.8 kg)  05/23/17 172 lb 3.2 oz (78.1 kg)  05/15/17 180 lb (81.6 kg)    BMI:  Body mass index is 23.82 kg/m. Pt meets criteria for normal based on current BMI.  Estimated Nutritional Needs: Kcal: 25-30 kcal/kg Protein: > 1 gram protein/kg Fluid: 1 ml/kcal  Diet Order: Diet regular Room service appropriate? Yes; Fluid consistency: Thin Pt is also offered choice of unit snacks mid-morning and mid-afternoon.  Pt is eating as desired.   Lab results and medications reviewed.   Clayton Bibles, MS, RD, [place] Dietitian Pager: [phone] After Hours Pager: [phone]

## 2017-09-26 NOTE — Progress Notes (Signed)
Patient ID: Douglas Knight, male   DOB: June 19, 1986, 31 y.o.   MRN: [id number] D:Affect is flat/sad,mood is depressed. Stats that his goal today is to work on being" honest and letting go of control".Pt has been visible in the milieu and attended groups and activities. Rates his depression and anxiety both as an 8 today. A:Support and encouragement offered. R:Receptive. No complaints of pain or problems at this time,

## 2017-09-26 NOTE — ED Provider Notes (Signed)
  [place]   [id number] 09/21/17 Arrival Time: 4098  ASSESSMENT & PLAN:  1. Essential hypertension   2. Other migraine without status migrainosus, not intractable     Meds ordered this encounter  Medications  . ketorolac (TORADOL) injection 60 mg  . metoCLOPramide (REGLAN) injection 5 mg  . dexamethasone (DECADRON) injection 10 mg  . SUMAtriptan (IMITREX) injection 6 mg   Will f/u tomorrow morning if not showing significant improvement, ED tonight if needed.  Reviewed expectations re: course of current medical issues. Questions answered. Outlined signs and symptoms indicating need for more acute intervention. Patient verbalized understanding. After Visit Summary given.   SUBJECTIVE:  Douglas Knight is a 31 y.o. male who presents with complaint of a headache. Has been monitoring BP lately and reports very elevated at times. H/O similar headaches. This headache present for 1-2 days. Gradual onset. Frontal and occipital bilaterally. No visual or hearing changes. No photophobia. OTC analgesics without much relief. Mild nausea without emesis. Ambulatory without difficulty. No extremity sensation changes or weakness.  ROS: As per HPI.   OBJECTIVE:  Vitals:   09/21/17 1723  BP: (!) 162/97  Pulse: 77  Resp: 18  Temp: 98.2 F (36.8 C)  TempSrc: Oral  SpO2: 100%    General appearance: alert; no distress Eyes: PERRLA; EOMI; conjunctiva normal HENT: normocephalic; atraumatic Neck: supple Lungs: clear to auscultation bilaterally Heart: regular rate and rhythm Abdomen: soft, non-tender  Extremities: no cyanosis or edema; symmetrical with no gross deformities Skin: warm and dry Neurologic: normal gait; normal symmetric reflexes Psychological: alert and cooperative; normal mood and affect   Allergies  Allergen Reactions  . Sulfa Antibiotics Anaphylaxis, Hives and Other (See Comments)    Stomach pain  . Wellbutrin [Bupropion] Shortness Of Breath    Past  Medical History:  Diagnosis Date  . Anal fissure   . Anxiety   . Depression   . Hypertension   . IBS (irritable bowel syndrome)   . ITP (idiopathic thrombocytopenic purpura)    Social History   Social History  . Marital status: Single    Spouse name: N/A  . Number of children: 0  . Years of education: N/A   Occupational History  . social worker/addiction specialist    Social History Main Topics  . Smoking status: Never Smoker  . Smokeless tobacco: Never Used  . Alcohol use 0.0 oz/week     Comment: rarely  . Drug use: No  . Sexual activity: Yes    Partners: Female    Birth control/ protection: None   Other Topics Concern  . Not on file   Social History Narrative   01/2015 Currently working on Masters of SW at [employer] History  Problem Relation Age of Onset  . Diabetes Father   . Hypertension Father   . Colon polyps Father   . Colon polyps Mother   . Heart disease Maternal Grandfather   . Alzheimer's disease Paternal Grandmother   . Depression Paternal Grandfather   . Colon polyps Paternal Grandfather   . Colon cancer Paternal Aunt    Past Surgical History:  Procedure Laterality Date  . APPENDECTOMY  2003   [facility]      Vanessa Kick, MD 09/26/17 1017

## 2017-09-26 NOTE — Progress Notes (Signed)
Adult Psychoeducational Group Note  Date:  09/26/2017 Time:  9:44 PM  Group Topic/Focus:  Wrap-Up Group:   The focus of this group is to help patients review their daily goal of treatment and discuss progress on daily workbooks.  Participation Level:  Active  Participation Quality:  Appropriate  Affect:  Appropriate  Cognitive:  Appropriate  Insight: Good  Engagement in Group:  Engaged  Modes of Intervention:  Activity  Additional Comments:  Pt rated his day a 2. Goal is to cut down on negative self talk and seek treatment for mood disorder.  Douglas Knight 09/26/2017, 9:44 PM

## 2017-09-26 NOTE — BHH Suicide Risk Assessment (Addendum)
[facility] Admission Suicide Risk Assessment   Nursing information obtained from:  Patient Demographic factors:  Male, Caucasian Current Mental Status:  Suicidal ideation indicated by patient Loss Factors:  Financial problems / change in socioeconomic status Historical Factors:  Prior suicide attempts, Family history of mental illness or substance abuse Risk Reduction Factors:  Sense of responsibility to family, Employed, Living with another person, especially a relative, Positive social support, Positive coping skills or problem solving skills  Total Time spent with patient: 45 minutes Principal Problem: MDD (major depressive disorder), recurrent episode, severe (Douglas Knight) Diagnosis:   Patient Active Problem List   Diagnosis Date Noted  . MDD (major depressive disorder), recurrent episode, severe ([place]) [F33.2] 09/25/2017  . Acute tension headache [G44.209] 05/15/2017  . Generalized anxiety disorder [F41.1] 05/15/2017  . Colon polyp [K63.5] 12/07/2016  . Irritable bowel syndrome [K58.9] 09/02/2016  . External hemorrhoid [K64.4] 09/02/2016  . Allergic rhinitis [J30.9] 04/29/2016  . Migraine [G43.909] 04/29/2016  . Vitamin D deficiency [E55.9] 11/17/2015  . Insomnia [G47.00] 07/27/2015  . Acne [L70.9] 03/09/2015  . Essential hypertension [I10] 02/27/2015  . Elevated serum creatinine [R79.89] 12/31/2014  . Fatigue [R53.83] 12/29/2014  . Medication management [Z79.899] 12/29/2014  . Major depressive disorder, recurrent episode, moderate ([place]) [F33.1] 12/29/2014  . Chronic ITP (idiopathic thrombocytopenia) ([facility]) [D69.3] 12/29/2014    Continued Clinical Symptoms:  Alcohol Use Disorder Identification Test Final Score (AUDIT): 1 The "Alcohol Use Disorders Identification Test", Guidelines for Use in Primary Care, Second Edition.  World Pharmacologist [facility]). Score between 0-7:  no or low risk or alcohol related problems. Score between 8-15:  moderate risk of alcohol related problems. Score  between 16-19:  high risk of alcohol related problems. Score 20 or above:  warrants further diagnostic evaluation for alcohol dependence and treatment.   CLINICAL FACTORS:  31 year old single male, no children, lives with GF, employed .  Reports worsening depression, suicidal ideations, with thoughts of cutting wrists or overdosing . States he has been experiencing nightmares of killing self for several weeks. Denies psychotic symptoms. Describes neuro-vegetative symptoms of depression, including decreased energy, decreased appetite, weight loss, decreased energy level, anhedonia.  Presented to ED voluntarily.  One prior admission in 2007 for depression , suicidal ideations, denies history of mania or hypomania. States that in the past has been on Lexapro, Zoloft, Prozac, Effexor, Wellbutrin in the past, and states he does not feel they worked . States they tend to make him feel over activated and  " jittery"  Denies drug or alcohol abuse   Denies medical illnesses other than idiopathic thrombocytopenia, but states it is mild ( admission platelet count 150).   Prior to admission was prescribed Xanax, at 1 mgr BID- denies abuse or misuse, Neurontin 200 mgrs TID.   Dx- MDD, without psychotic symptoms  Plan- Inpatient admission. Based on history of reported  multiple antidepressant failures at least some of which were due to feeling more activated and impulsive, would consider a mood stabilizer . Start Lamictal 25 mgrs QDAY, Seroquel 100  mgrs QHS  Based on concern that he has felt more depressed in spite of Neurontin and that Neurontin may be associated with depression, as well as to decrease potential excessive sedation /medication interactions,  will initiate gradual taper .    Musculoskeletal: Strength & Muscle Tone: within normal limits Gait & Station: normal Patient leans: N/A  Psychiatric Specialty Exam: Physical Exam  ROS  No headache, no chest pain, no shortness of breath, no  vomiting ,  no diarrhea   Blood pressure 122/82, pulse (!) 130, temperature 98.7 F (37.1 C), temperature source Oral, resp. rate 20, height 5\' 10"  (1.778 m), weight 75.3 kg (166 lb).Body mass index is 23.82 kg/m.  General Appearance: Fairly Groomed  Eye Contact:  Fair  Speech:  Slow  Volume:  Decreased  Mood:  Depressed  Affect:  Constricted  Thought Process:  Linear and Descriptions of Associations: Intact  Orientation:  Other:  fully alert and attentive  Thought Content:  no hallucinations , no delusions , not internally preoccupied  Suicidal Thoughts:  No denies current suicidal plans or intentions and contracts for safety on unit, denies homicidal or violent ideations   Homicidal Thoughts:  No  Memory:  recent and remote grossly intact   Judgement:  Fair  Insight:  Fair  Psychomotor Activity:  Decreased  Concentration:  Concentration: Good and Attention Span: Good  Recall:  Good  Fund of Knowledge:  Good  Language:  Good  Akathisia:  Negative  Handed:  Right  AIMS (if indicated):     Assets:  Communication Skills Desire for Improvement Resilience  ADL's:  Intact  Cognition:  WNL  Sleep:  Number of Hours: 5.75      COGNITIVE FEATURES THAT CONTRIBUTE TO RISK:  Closed-mindedness and Loss of executive function    SUICIDE RISK:   Moderate:  Frequent suicidal ideation with limited intensity, and duration, some specificity in terms of plans, no associated intent, good self-control, limited dysphoria/symptomatology, some risk factors present, and identifiable protective factors, including available and accessible social support.  PLAN OF CARE: Patient will be admitted to inpatient psychiatric unit for stabilization and safety. Will provide and encourage milieu participation. Provide medication management and maked adjustments as needed.  Will follow daily.    I certify that inpatient services furnished can reasonably be expected to improve the patient's condition.    Jenne Campus, MD 09/26/2017, 4:49 PM

## 2017-09-27 LAB — PROLACTIN: Prolactin: 23.6 ng/mL — ABNORMAL HIGH (ref 4.0–15.2)

## 2017-09-27 MED ORDER — QUETIAPINE FUMARATE 25 MG PO TABS
25.0000 mg | ORAL_TABLET | ORAL | Status: DC
  Administered 2017-09-28 – 2017-09-29 (×2): 25 mg via ORAL
  Filled 2017-09-27 (×5): qty 1

## 2017-09-27 MED ORDER — POTASSIUM CHLORIDE CRYS ER 20 MEQ PO TBCR
20.0000 meq | EXTENDED_RELEASE_TABLET | Freq: Two times a day (BID) | ORAL | Status: AC
  Administered 2017-09-27 – 2017-09-29 (×4): 20 meq via ORAL
  Filled 2017-09-27 (×6): qty 1

## 2017-09-27 NOTE — Progress Notes (Signed)
D: Patient appears depressed and sad.  His affect is flat and blunted.  His speech is soft and his interaction is minimal with poor eye contact.  He denies any thoughts of self harm today.  He states he slept fair; his appetite is fair.  He reports low energy and poor concentration.  Patient has been observed lying in bed instead of going to groups.  His goal today is to "think positive thoughts and less negative self talk."  Patient states that his sleep medication is not lasting through the night; he is waking up several times with continued nightmares.   A: Continue to monitor medication management and MD orders.  Safety checks continued every 15 minutes per protocol. R: Patient is isolative and withdrawn.

## 2017-09-27 NOTE — Progress Notes (Signed)
D: Pt was in his room with visitor upon initial approach.  Pt presents with anxious, depressed affect and mood.  He is tearful upon approach.  Describes his day as "pretty crappy" and reports on-going depression.  His goal was to "have less negative self-talk" and he reports he did not meet this goal, although he "tried going through the workbook, I did the triggers and warning signs exercise, shared in goals group, tried to play cards."  Pt reports he had a good visit with his mother tonight.   He reports that his visit with his girlfriend was difficult because she has been asking him questions related to when pt cheated on her and pt states "I'm not in the head space to talk about what happened."   Pt denies HI, denies hallucinations, reports pain from headache of 6/10.  Pt has been visible in milieu at times with few peer interactions.  He was seen multiple times sitting on the edge of his bed in his room with his head down, staring at the ground.  Pt attended evening group.    A: Introduced self to pt.  Actively listened to pt and offered support and encouragement. Medication administered per order.  PRN medication administered for anxiety and pain.  Praised pt for trying to use multiple coping skills throughout the day.  Asked pt what else helps him cope.  He reports writing helps and he was provided with a journal.  Pt was reminded that he is able to remove people from his visitation list if he wishes to do so.  Pt was encouraged to focus on his treatment.  Q15 minute safety checks maintained.  R: Pt is safe on the unit.  Pt is compliant with medications.  Pt verbally contracts for safety.  Will continue to monitor and assess.

## 2017-09-27 NOTE — Plan of Care (Signed)
Problem: Activity: Goal: Interest or engagement in leisure activities will improve Outcome: Not Progressing Patient has not attended any groups today; has been encouraged to attend and participate in his treatment.

## 2017-09-27 NOTE — Tx Team (Signed)
Interdisciplinary Treatment and Diagnostic Plan Update  09/27/2017 Time of Session: 9:30am Douglas Knight MRN: [id number]  Principal Diagnosis: MDD (major depressive disorder), recurrent episode, severe ([employer])  Secondary Diagnoses: Principal Problem:   MDD (major depressive disorder), recurrent episode, severe ([place]) Active Problems:   Generalized anxiety disorder   Current Medications:  Current Facility-Administered Medications  Medication Dose Route Frequency Provider Last Rate Last Dose  . acetaminophen (TYLENOL) tablet 650 mg  650 mg Oral Q6H PRN Laverle Hobby, PA-C   650 mg at 09/26/17 2104  . ALPRAZolam Duanne Moron) tablet 0.5 mg  0.5 mg Oral TID PRN Cobos, Myer Peer, MD   0.5 mg at 09/26/17 2104  . alum & mag hydroxide-simeth (MAALOX/MYLANTA) 200-200-20 MG/5ML suspension 30 mL  30 mL Oral Q4H PRN Patriciaann Clan E, PA-C      . feeding supplement (ENSURE ENLIVE) (ENSURE ENLIVE) liquid 237 mL  237 mL Oral BID BM Cobos, Myer Peer, MD   237 mL at 09/26/17 1426  . gabapentin (NEURONTIN) capsule 200 mg  200 mg Oral TID Laverle Hobby, PA-C   200 mg at 09/27/17 4481  . hydrOXYzine (ATARAX/VISTARIL) tablet 25 mg  25 mg Oral Q6H PRN Laverle Hobby, PA-C   25 mg at 09/26/17 2104  . lamoTRIgine (LAMICTAL) tablet 25 mg  25 mg Oral Daily Cobos, Myer Peer, MD   25 mg at 09/27/17 0811  . magnesium hydroxide (MILK OF MAGNESIA) suspension 30 mL  30 mL Oral Daily PRN Patriciaann Clan E, PA-C      . pantoprazole (PROTONIX) EC tablet 40 mg  40 mg Oral Daily Patriciaann Clan E, PA-C   40 mg at 09/27/17 8563  . propranolol (INDERAL) tablet 40 mg  40 mg Oral Daily Laverle Hobby, PA-C   40 mg at 09/27/17 1497  . QUEtiapine (SEROQUEL) tablet 100 mg  100 mg Oral QHS Cobos, Myer Peer, MD   100 mg at 09/26/17 2104    PTA Medications: Prescriptions Prior to Admission  Medication Sig Dispense Refill Last Dose  . ALPRAZolam (XANAX) 1 MG tablet Take 1 tablet (1 mg total) by mouth 2 (two) times daily as  needed for anxiety. 20 tablet 0 09/25/2017 at Unknown time  . docusate sodium (COLACE) 100 MG capsule Take 1 capsule (100 mg total) by mouth 2 (two) times daily. (Patient taking differently: Take by mouth daily as needed for mild constipation. ) 60 capsule 2 Past Month at Unknown time  . gabapentin (NEURONTIN) 100 MG capsule Take 200 mg by mouth See admin instructions. Take 2 capsules (200 mg) by mouth three times daily - morning, noon and evening (take 800 mg tablet at bedtime)  5 09/25/2017 at Unknown time  . Suvorexant (BELSOMRA) 20 MG TABS Take 20 mg by mouth at bedtime.   09/25/2017 at Unknown time  . gabapentin (NEURONTIN) 800 MG tablet Take 800 mg by mouth at bedtime.  5 09/25/2017  . propranolol (INDERAL) 40 MG tablet Take 1 tablet (40 mg total) by mouth daily. 30 tablet 0 09/25/2017    Treatment Modalities: Medication Management, Group therapy, Case management,  1 to 1 session with clinician, Psychoeducation, Recreational therapy.  Patient Stressors: Financial difficulties Occupational concerns Other: relationship stressors  Patient Strengths: Ability for insight Average or above average intelligence Capable of independent living Communication skills General fund of knowledge Physical Health Supportive family/friends Work Financial controller Plan for Primary Diagnosis: MDD (major depressive disorder), recurrent episode, severe ([place]) Long Term Goal(s): Improvement in symptoms so  as ready for discharge  Short Term Goals: Ability to verbalize feelings will improve Ability to disclose and discuss suicidal ideas Ability to demonstrate self-control will improve Ability to maintain clinical measurements within normal limits will improve Compliance with prescribed medications will improve  Medication Management: Evaluate patient's response, side effects, and tolerance of medication regimen.  Therapeutic Interventions: 1 to 1 sessions, Unit Group sessions and Medication  administration.  Evaluation of Outcomes: Not Met  Physician Treatment Plan for Secondary Diagnosis: Principal Problem:   MDD (major depressive disorder), recurrent episode, severe ([place]) Active Problems:   Generalized anxiety disorder   Long Term Goal(s): Improvement in symptoms so as ready for discharge  Short Term Goals: Ability to verbalize feelings will improve Ability to disclose and discuss suicidal ideas Ability to demonstrate self-control will improve Ability to maintain clinical measurements within normal limits will improve Compliance with prescribed medications will improve  Medication Management: Evaluate patient's response, side effects, and tolerance of medication regimen.  Therapeutic Interventions: 1 to 1 sessions, Unit Group sessions and Medication administration.  Evaluation of Outcomes: Not Met   RN Treatment Plan for Primary Diagnosis: MDD (major depressive disorder), recurrent episode, severe ([place]) Long Term Goal(s): Knowledge of disease and therapeutic regimen to maintain health will improve  Short Term Goals: Ability to disclose and discuss suicidal ideas, Ability to identify and develop effective coping behaviors will improve and Compliance with prescribed medications will improve  Medication Management: RN will administer medications as ordered by provider, will assess and evaluate patient's response and provide education to patient for prescribed medication. RN will report any adverse and/or side effects to prescribing provider.  Therapeutic Interventions: 1 on 1 counseling sessions, Psychoeducation, Medication administration, Evaluate responses to treatment, Monitor vital signs and CBGs as ordered, Perform/monitor CIWA, COWS, AIMS and Fall Risk screenings as ordered, Perform wound care treatments as ordered.  Evaluation of Outcomes: Not Met   LCSW Treatment Plan for Primary Diagnosis: MDD (major depressive disorder), recurrent episode, severe ([place]) Long  Term Goal(s): Safe transition to appropriate next level of care at discharge, Engage patient in therapeutic group addressing interpersonal concerns.  Short Term Goals: Engage patient in aftercare planning with referrals and resources and Identify triggers associated with mental health/substance abuse issues  Therapeutic Interventions: Assess for all discharge needs, 1 to 1 time with Social worker, Explore available resources and support systems, Assess for adequacy in community support network, Educate family and significant other(s) on suicide prevention, Complete Psychosocial Assessment, Interpersonal group therapy.  Evaluation of Outcomes: Not Met   Progress in Treatment: Attending groups: Pt is new to milieu, continuing to assess  Participating in groups: Pt is new to milieu, continuing to assess  Taking medication as prescribed: Yes, MD continues to assess for medication changes as needed Toleration medication: Yes, no side effects reported at this time Family/Significant other contact made: No, Pt declines Patient understands diagnosis: Continuing to assess Discussing patient identified problems/goals with staff: Yes Medical problems stabilized or resolved: Yes Denies suicidal/homicidal ideation: No, endorses passive SI Issues/concerns per patient self-inventory: None Other: N/A  New problem(s) identified: None identified at this time.   New Short Term/Long Term Goal(s): "working on negative self-talk and self-esteem"  Discharge Plan or Barriers: Pt is planning to discharge to [facility] for continued residential treatment.   Reason for Continuation of Hospitalization: Anxiety Depression Medication stabilization Suicidal ideation  Estimated Length of Stay: 3-5 days; est DC date 11/5  Attendees: Patient: Douglas Knight 09/27/2017  8:27 AM  Physician: Dr. Parke Poisson, MD  09/27/2017  8:27 AM  Nursing: Oren Beckmann 09/27/2017  8:27 AM  RN Care Manager: Lars Pinks, RN  09/27/2017  8:27 AM  Social Worker: Adriana Reams, LCSW 09/27/2017  8:27 AM  Recreational Therapist:  09/27/2017  8:27 AM  Other: Lindell Spar, NP; Marvia Pickles, NP 09/27/2017  8:27 AM  Other:  09/27/2017  8:27 AM  Other: 09/27/2017  8:27 AM    Scribe for Treatment Team: Gladstone Lighter, LCSW 09/27/2017 8:27 AM

## 2017-09-27 NOTE — [facility] Group Notes (Signed)
[facility] Group Therapy 09/27/2017 1:15pm  Type of Therapy: [facility] Presentation  Pt did not attend, declined invitation. Pt was completing screening interview for admission to another facility.  Gladstone Lighter, LCSW 09/27/2017 4:44 PM

## 2017-09-27 NOTE — Plan of Care (Signed)
Problem: Self-Concept: Goal: Ability to disclose and discuss suicidal ideas will improve Outcome: Progressing Pt endorses SI without a plan tonight.  He verbally contracts for Probation officer.

## 2017-09-27 NOTE — Progress Notes (Signed)
D: Pt denies SI/HI/AVH. Pt is pleasant and cooperative. Pt pt goal for today was to increase his +ve talk and stated he did better today and cried less .   A: Pt was offered support and encouragement. Pt was given scheduled medications. Pt was encourage to attend groups. Q 15 minute checks were done for safety.   R:Pt attends groups and interacts well with peers and staff. Pt is taking medication. Pt has no complaints.Pt receptive to treatment and safety maintained on unit.

## 2017-09-27 NOTE — Progress Notes (Addendum)
[facility] MD Progress Note  09/27/2017 2:24 PM Douglas Knight  MRN:  [id number] Subjective:  Patient reports ongoing depression, anxiety, but endorses some improvement compared to how he felt prior to admission. Reports he remains labile and feeling overwhelmed at times . States yesterday evening felt acutely overwhelmed regarding being visited and contacted by [place] and other male. Denies medication side effects. Objective : I have discussed case with treatment team and have met with patient. As per staff patient has continued to present sad, depressed, blunted in affect, tending to isolate, although reporting that he has a goal of being more positive about himself and getting a job in the near future, as he states he recently resigned his prior job. Currently patient is presenting with improved eye contact and rate of speech and although still depressed and constricted, presents somewhat improved. Smiles briefly at times today, although overall affect remains constricted . Denies suicidal plan or intention and contracts for safety on unit . Denies medication side effects.  Behavior on unit in good control, no agitation .  Principal Problem: MDD (major depressive disorder), recurrent episode, severe ([place]) Diagnosis:   Patient Active Problem List   Diagnosis Date Noted  . MDD (major depressive disorder), recurrent episode, severe ([place]) [F33.2] 09/25/2017  . Acute tension headache [G44.209] 05/15/2017  . Generalized anxiety disorder [F41.1] 05/15/2017  . Colon polyp [K63.5] 12/07/2016  . Irritable bowel syndrome [K58.9] 09/02/2016  . External hemorrhoid [K64.4] 09/02/2016  . Allergic rhinitis [J30.9] 04/29/2016  . Migraine [G43.909] 04/29/2016  . Vitamin D deficiency [E55.9] 11/17/2015  . Insomnia [G47.00] 07/27/2015  . Acne [L70.9] 03/09/2015  . Essential hypertension [I10] 02/27/2015  . Elevated serum creatinine [R79.89] 12/31/2014  . Fatigue [R53.83] 12/29/2014  . Medication management  [Z79.899] 12/29/2014  . Major depressive disorder, recurrent episode, moderate ([place]) [F33.1] 12/29/2014  . Chronic ITP (idiopathic thrombocytopenia) (H[facility]) [D69.3] 12/29/2014   Total Time spent with patient: 20 minutes  Past Medical History:  Past Medical History:  Diagnosis Date  . Anal fissure   . Anxiety   . Depression   . Hypertension   . IBS (irritable bowel syndrome)   . ITP (idiopathic thrombocytopenic purpura)     Past Surgical History:  Procedure Laterality Date  . APPENDECTOMY  2003   [facility]   Family History:  Family History  Problem Relation Age of Onset  . Diabetes Father   . Hypertension Father   . Colon polyps Father   . Colon polyps Mother   . Heart disease Maternal Grandfather   . Alzheimer's disease Paternal Grandmother   . Depression Paternal Grandfather   . Colon polyps Paternal Grandfather   . Colon cancer Paternal Aunt     Social History:  History  Alcohol Use  . 0.0 oz/week    Comment: rarely     History  Drug Use No    Social History   Social History  . Marital status: Single    Spouse name: N/A  . Number of children: 0  . Years of education: N/A   Occupational History  . social worker/addiction specialist    Social History Main Topics  . Smoking status: Never Smoker  . Smokeless tobacco: Never Used  . Alcohol use 0.0 oz/week     Comment: rarely  . Drug use: No  . Sexual activity: Yes    Partners: Female    Birth control/ protection: None   Other Topics Concern  . None   Social History Narrative  01/2015 Currently working on [employer] at [place] History:    Pain Medications: denies Prescriptions: denies Over the Counter: denies History of alcohol / drug use?: No history of alcohol / drug abuse Longest period of sobriety (when/how long): N/A  Sleep: improved   Appetite:  Fair  Current Medications: Current Facility-Administered Medications  Medication Dose Route Frequency  Provider Last Rate Last Dose  . acetaminophen (TYLENOL) tablet 650 mg  650 mg Oral Q6H PRN Laverle Hobby, PA-C   650 mg at 09/26/17 2104  . ALPRAZolam Duanne Moron) tablet 0.5 mg  0.5 mg Oral TID PRN Cobos, Myer Peer, MD   0.5 mg at 09/26/17 2104  . alum & mag hydroxide-simeth (MAALOX/MYLANTA) 200-200-20 MG/5ML suspension 30 mL  30 mL Oral Q4H PRN Patriciaann Clan E, PA-C      . feeding supplement (ENSURE ENLIVE) (ENSURE ENLIVE) liquid 237 mL  237 mL Oral BID BM Cobos, Myer Peer, MD   237 mL at 09/26/17 1426  . gabapentin (NEURONTIN) capsule 200 mg  200 mg Oral TID Patriciaann Clan E, PA-C   200 mg at 09/27/17 1204  . hydrOXYzine (ATARAX/VISTARIL) tablet 25 mg  25 mg Oral Q6H PRN Laverle Hobby, PA-C   25 mg at 09/26/17 2104  . lamoTRIgine (LAMICTAL) tablet 25 mg  25 mg Oral Daily Cobos, Myer Peer, MD   25 mg at 09/27/17 0811  . magnesium hydroxide (MILK OF MAGNESIA) suspension 30 mL  30 mL Oral Daily PRN Patriciaann Clan E, PA-C      . pantoprazole (PROTONIX) EC tablet 40 mg  40 mg Oral Daily Patriciaann Clan E, PA-C   40 mg at 09/27/17 6568  . propranolol (INDERAL) tablet 40 mg  40 mg Oral Daily Laverle Hobby, PA-C   40 mg at 09/27/17 1275  . QUEtiapine (SEROQUEL) tablet 100 mg  100 mg Oral QHS Cobos, Myer Peer, MD   100 mg at 09/26/17 2104    Lab Results:  Results for orders placed or performed during the hospital encounter of 09/25/17 (from the past 48 hour(s))  CBC     Status: Abnormal   Collection Time: 09/26/17  6:45 AM  Result Value Ref Range   WBC 4.4 4.0 - 10.5 K/uL   RBC 4.86 4.22 - 5.81 MIL/uL   Hemoglobin 15.9 13.0 - 17.0 g/dL   HCT 44.0 39.0 - 52.0 %   MCV 90.5 78.0 - 100.0 fL   MCH 32.7 26.0 - 34.0 pg   MCHC 36.1 (H) 30.0 - 36.0 g/dL   RDW 12.0 11.5 - 15.5 %   Platelets 150 150 - 400 K/uL    Comment: Performed at [facility], [place] [address], [place], [place] [address]  Hemoglobin A1c     Status: None   Collection Time: 09/26/17  6:45 AM  Result Value  Ref Range   Hgb A1c MFr Bld 4.8 4.8 - 5.6 %    Comment: (NOTE) Pre diabetes:          5.7%-6.4% Diabetes:              >6.4% Glycemic control for   <7.0% adults with diabetes    Mean Plasma Glucose 91.06 mg/dL    Comment: Performed at [facility] [address], [place], [place] [address]  Lipid panel     Status: None   Collection Time: 09/26/17  6:45 AM  Result Value Ref Range   Cholesterol 137 0 - 200  mg/dL   Triglycerides 121 <150 mg/dL   HDL 60 >40 mg/dL   Total CHOL/HDL Ratio 2.3 RATIO   VLDL 24 0 - 40 mg/dL   LDL Cholesterol 53 0 - 99 mg/dL    Comment:        Total Cholesterol/HDL:CHD Risk Coronary Heart Disease Risk Table                     Men   Women  1/2 Average Risk   3.4   3.3  Average Risk       5.0   4.4  2 X Average Risk   9.6   7.1  3 X Average Risk  23.4   11.0        Use the calculated Patient Ratio above and the CHD Risk Table to determine the patient's CHD Risk.        ATP III CLASSIFICATION (LDL):  <100     mg/dL   Optimal  100-129  mg/dL   Near or Above                    Optimal  130-159  mg/dL   Borderline  160-189  mg/dL   High  >190     mg/dL   Very High Performed at [place] [address], [place], [place] [address]   Hepatic function panel     Status: None   Collection Time: 09/26/17  6:45 AM  Result Value Ref Range   Total Protein 7.3 6.5 - 8.1 g/dL   Albumin 4.4 3.5 - 5.0 g/dL   AST 18 15 - 41 U/L   ALT 21 17 - 63 U/L   Alkaline Phosphatase 84 38 - 126 U/L   Total Bilirubin 0.9 0.3 - 1.2 mg/dL   Bilirubin, Direct 0.1 0.1 - 0.5 mg/dL   Indirect Bilirubin 0.8 0.3 - 0.9 mg/dL    Comment: Performed at [facility], [place] [address] [place], [place] [address]  TSH     Status: None   Collection Time: 09/26/17  6:45 AM  Result Value Ref Range   TSH 2.482 0.350 - 4.500 uIU/mL    Comment: Performed by a 3rd Generation assay with a functional sensitivity of <=0.01 uIU/mL. Performed at [facility], [place] [address], [place], [place] [address]   Prolactin     Status: Abnormal   Collection Time: 09/26/17  6:45 AM  Result Value Ref Range   Prolactin 23.6 (H) 4.0 - 15.2 ng/mL    Comment: (NOTE) Performed At: [facility] [place], [place] [id number] Lindon Romp MD [id number] Performed at [facility], [place] [address], [place], [place] [address]   Basic metabolic panel     Status: Abnormal   Collection Time: 09/26/17  6:45 AM  Result Value Ref Range   Sodium 143 135 - 145 mmol/L   Potassium 3.0 (L) 3.5 - 5.1 mmol/L   Chloride 106 101 - 111 mmol/L   CO2 28 22 - 32 mmol/L   Glucose, Bld 136 (H) 65 - 99 mg/dL   BUN 17 6 - 20 mg/dL   Creatinine, Ser 1.31 (H) 0.61 - 1.24 mg/dL   Calcium 9.4 8.9 - 10.3 mg/dL   GFR calc non Af Amer >60 >60 mL/min   GFR calc Af Amer >60 >60 mL/min    Comment: (NOTE) The eGFR has been calculated using the CKD EPI equation. This calculation  has not been validated in all clinical situations. eGFR's persistently <60 mL/min signify possible Chronic Kidney Disease.    Anion gap 9 5 - 15    Comment: Performed at [facility], [place] [address], [place], [place] [address]  Urine rapid drug screen (hosp performed)not at [facility]     Status: Abnormal   Collection Time: 09/26/17  7:00 AM  Result Value Ref Range   Opiates NONE DETECTED NONE DETECTED   Cocaine NONE DETECTED NONE DETECTED   Benzodiazepines POSITIVE (A) NONE DETECTED   Amphetamines NONE DETECTED NONE DETECTED   Tetrahydrocannabinol NONE DETECTED NONE DETECTED   Barbiturates NONE DETECTED NONE DETECTED    Comment:        DRUG SCREEN FOR MEDICAL PURPOSES ONLY.  IF CONFIRMATION IS NEEDED FOR ANY PURPOSE, NOTIFY LAB WITHIN 5 DAYS.        LOWEST DETECTABLE LIMITS FOR URINE DRUG SCREEN Drug Class       Cutoff (ng/mL) Amphetamine      1000 Barbiturate      200 Benzodiazepine   834 Tricyclics       196 Opiates           300 Cocaine          300 THC              50 Performed at [facility], [place] [address] [place], [place] [address]     Blood Alcohol level:  No results found for: [facility]  Metabolic Disorder Labs: Lab Results  Component Value Date   HGBA1C 4.8 09/26/2017   MPG 91.06 09/26/2017   Lab Results  Component Value Date   PROLACTIN 23.6 (H) 09/26/2017   Lab Results  Component Value Date   CHOL 137 09/26/2017   TRIG 121 09/26/2017   HDL 60 09/26/2017   CHOLHDL 2.3 09/26/2017   VLDL 24 09/26/2017   LDLCALC 53 09/26/2017   LDLCALC 97 12/29/2014    Physical Findings: AIMS: Facial and Oral Movements Muscles of Facial Expression: None, normal Lips and Perioral Area: None, normal Jaw: None, normal Tongue: None, normal,Extremity Movements Upper (arms, wrists, hands, fingers): None, normal Lower (legs, knees, ankles, toes): None, normal, Trunk Movements Neck, shoulders, hips: None, normal, Overall Severity Severity of abnormal movements (highest score from questions above): None, normal Incapacitation due to abnormal movements: None, normal Patient's awareness of abnormal movements (rate only patient's report): No Awareness, Dental Status Current problems with teeth and/or dentures?: No Does patient usually wear dentures?: No  CIWA:  CIWA-Ar Total: 1 COWS:  COWS Total Score: 1  Musculoskeletal: Strength & Muscle Tone: within normal limits Gait & Station: normal Patient leans: N/A  Psychiatric Specialty Exam: Physical Exam  ROS denies chest pain, no shortness of breath, no vomiting   Blood pressure (!) 128/103, pulse (!) 108, temperature 97.6 F (36.4 C), temperature source Oral, resp. rate 20, height _0  (1.778 m), weight 75.3 kg (166 lb).Body mass index is 23.82 kg/m.  Repeat vitals at 2,50 PM 123/93 pulse 84  General Appearance: Fairly Groomed  Eye Contact:  Fair- improving   Speech:  Normal Rate  Volume:  Normal  Mood:  partially improved  mood, but remains depressed   Affect:  Constricted  Thought Process:  Linear and Descriptions of Associations: Intact  Orientation:  Other:  alert and attentive, oriented x 3   Thought Content:  denies hallucinations, no delusions, not internally preoccupied   Suicidal Thoughts:  No denies suicidal plan or intention and  contracts for safety at this time  Homicidal Thoughts:  No denies homicidal or violent ideations  Memory:  recent and remote grossly intact   Judgement:  Other:  improving   Insight:  improving   Psychomotor Activity:  Decreased  Concentration:  Concentration: Good and Attention Span: Good  Recall:  Good  Fund of Knowledge:  Good  Language:  Good  Akathisia:  Negative  Handed:  Right  AIMS (if indicated):     Assets:  Communication Skills Desire for Improvement Resilience  ADL's:  Intact  Cognition:  WNL  Sleep:  Number of Hours: 6.75   Assessment - patient remains depressed, constricted, ruminative, feeling intermittently overwhelmed. Does endorse partially improved mood and today presents better groomed, with improving eye contact, and less constricted affect. Tolerating medications well thus far. As noted, patient has not endorsed history of manic episodes, but has reported poor response and /or activation to several antidepressants in the past .   Treatment Plan Summary: Daily contact with patient to assess and evaluate symptoms and progress in treatment, Medication management, Plan inpatient treatment  and medications as below Encourage group and milieu participation to work on coping skills and symptom reduction Treatment team working on disposition planning  Lamictal 25 mgrs QDAY for mood disorder  Increase Seroquel to 25 mgrs QAM and 100 mgrs QHS for mood disorder  Neurontin 200 mgrs TID for anxiety, pain KDUR supplementation for hypokalemia Repeat BMP in AM    Jenne Campus, MD 09/27/2017, 2:24 PM

## 2017-09-28 LAB — BASIC METABOLIC PANEL
ANION GAP: 10 (ref 5–15)
BUN: 18 mg/dL (ref 6–20)
CALCIUM: 9.3 mg/dL (ref 8.9–10.3)
CO2: 26 mmol/L (ref 22–32)
Chloride: 104 mmol/L (ref 101–111)
Creatinine, Ser: 1.21 mg/dL (ref 0.61–1.24)
GLUCOSE: 97 mg/dL (ref 65–99)
Potassium: 3.6 mmol/L (ref 3.5–5.1)
Sodium: 140 mmol/L (ref 135–145)

## 2017-09-28 MED ORDER — ALPRAZOLAM 0.5 MG PO TABS: 0.5000 mg | ORAL_TABLET | Freq: Two times a day (BID) | ORAL | Status: DC | PRN

## 2017-09-28 MED ORDER — QUETIAPINE FUMARATE 50 MG PO TABS
150.0000 mg | ORAL_TABLET | Freq: Every day | ORAL | Status: DC
  Administered 2017-09-28: 150 mg via ORAL
  Filled 2017-09-28 (×3): qty 3

## 2017-09-28 NOTE — [facility] Group Notes (Signed)
LCSW Group Therapy Note  09/28/2017 1:15pm  Type of Therapy/Topic:  Group Therapy:  Balance in Life  Participation Level:  Active  Description of Group:    This group will address the concept of balance and how it feels and looks when one is unbalanced. Patients will be encouraged to process areas in their lives that are out of balance and identify reasons for remaining unbalanced. Facilitators will guide patients in utilizing problem-solving interventions to address and correct the stressor making their life unbalanced. Understanding and applying boundaries will be explored and addressed for obtaining and maintaining a balanced life. Patients will be encouraged to explore ways to assertively make their unbalanced needs known to significant others in their lives, using other group members and facilitator for support and feedback.  Therapeutic Goals: 1. Patient will identify two or more emotions or situations they have that consume much of in their lives. 2. Patient will identify signs/triggers that life has become out of balance:  3. Patient will identify two ways to set boundaries in order to achieve balance in their lives:  4. Patient will demonstrate ability to communicate their needs through discussion and/or role plays  Summary of Patient Progress: Pt was present for the duration of the group. Pt states that he is feeling mostly balanced today. 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 Pt states that he feels he has learned a lot here and is looking forward to continuing his treatment at [facility]. Pt's affect was appropriate and pt offered meaningful feedback to peers.   Therapeutic Modalities:   Cognitive Behavioral Therapy Solution-Focused Therapy Assertiveness Training  Georga Kaufmann, MSW, [place] 09/28/2017 3:05 PM

## 2017-09-28 NOTE — Progress Notes (Addendum)
DAR NOTE: Patient presents with anxious affect and depressed mood. Pt complained of not sleeping well last night even after he took seroquel and vistaril. Pt has been calm and cooperative although appear depressed, has been present in the dayroom but not interacting much. Complained of headache, denies auditory and visual hallucinations.  Rates depression at 5, hopelessness at 4, and anxiety at 7.  Maintained on routine safety checks.  Medications given as prescribed.  Support and encouragement offered as needed.   States goal for today is "to remain solution focused."  Patient observed socializing with peers in the dayroom.  Offered no complaint.

## 2017-09-28 NOTE — Progress Notes (Signed)
Adult Psychoeducational Group Note  Date:  09/28/2017 Time:  9:57 PM  Group Topic/Focus:  Wrap-Up Group:   The focus of this group is to help patients review their daily goal of treatment and discuss progress on daily workbooks.  Participation Level:  Active  Participation Quality:  Appropriate  Affect:  Appropriate  Cognitive:  Alert, Appropriate and Oriented  Insight: Appropriate  Engagement in Group:  Engaged  Modes of Intervention:  Discussion  Additional Comments:  Patient attended group and said that his day was a 7.5. His coping skills for today were listening to music, attending groups, and playing cards.   Lennox Dolberry W Marshaun Lortie 01/27/5944, 9:57 PM

## 2017-09-28 NOTE — Progress Notes (Signed)
[facility] MD Progress Note  09/28/2017 12:16 PM Douglas Knight  MRN:  [id number]   Subjective:  Patient reports feeling good. He denies any SI/HI/AVH and improved depression. He reports disrupted sleep and feels his mood could be improved more. He denies any anxiety and reports that he has plans to go to [facility] next week.    Objective: Patient's chart and findings reviewed and discussed with treatment team. Patient presents pleasant and cooperative. He still has flat affect and depressed, but seems to have a better mood today. Will increase Seroquel to 150 mg QHS. CSW confirmed that patient is scheduled to go to [facility] on Tuesday.    Principal Problem: MDD (major depressive disorder), recurrent episode, severe ([place]) Diagnosis:   Patient Active Problem List   Diagnosis Date Noted  . MDD (major depressive disorder), recurrent episode, severe ([place]) [F33.2] 09/25/2017  . Acute tension headache [G44.209] 05/15/2017  . Generalized anxiety disorder [F41.1] 05/15/2017  . Colon polyp [K63.5] 12/07/2016  . Irritable bowel syndrome [K58.9] 09/02/2016  . External hemorrhoid [K64.4] 09/02/2016  . Allergic rhinitis [J30.9] 04/29/2016  . Migraine [G43.909] 04/29/2016  . Vitamin D deficiency [E55.9] 11/17/2015  . Insomnia [G47.00] 07/27/2015  . Acne [L70.9] 03/09/2015  . Essential hypertension [I10] 02/27/2015  . Elevated serum creatinine [R79.89] 12/31/2014  . Fatigue [R53.83] 12/29/2014  . Medication management [Z79.899] 12/29/2014  . Major depressive disorder, recurrent episode, moderate ([place]) [F33.1] 12/29/2014  . Chronic ITP (idiopathic thrombocytopenia) (HCC) [D69.3] 12/29/2014   Total Time spent with patient: 25 minutes  Past Psychiatric History: See H&P  Past Medical History:  Past Medical History:  Diagnosis Date  . Anal fissure   . Anxiety   . Depression   . Hypertension   . IBS (irritable bowel syndrome)   . ITP (idiopathic thrombocytopenic purpura)     Past Surgical  History:  Procedure Laterality Date  . APPENDECTOMY  2003   [facility]   Family History:  Family History  Problem Relation Age of Onset  . Diabetes Father   . Hypertension Father   . Colon polyps Father   . Colon polyps Mother   . Heart disease Maternal Grandfather   . Alzheimer's disease Paternal Grandmother   . Depression Paternal Grandfather   . Colon polyps Paternal Grandfather   . Colon cancer Paternal Aunt    Family Psychiatric  History: See H&P Social History:  History  Alcohol Use  . 0.0 oz/week    Comment: rarely     History  Drug Use No    Social History   Social History  . Marital status: Single    Spouse name: N/A  . Number of children: 0  . Years of education: N/A   Occupational History  . social worker/addiction specialist    Social History Main Topics  . Smoking status: Never Smoker  . Smokeless tobacco: Never Used  . Alcohol use 0.0 oz/week     Comment: rarely  . Drug use: No  . Sexual activity: Yes    Partners: Female    Birth control/ protection: None   Other Topics Concern  . None   Social History Narrative   01/2015 Currently working on Masters of SW at [employer]   Additional Social History:    Pain Medications: denies Prescriptions: denies Over the Counter: denies History of alcohol / drug use?: No history of alcohol / drug abuse Longest period of sobriety (when/how long): N/A  Sleep: Fair  Appetite:  Fair  Current Medications: Current Facility-Administered Medications  Medication Dose Route Frequency Provider Last Rate Last Dose  . acetaminophen (TYLENOL) tablet 650 mg  650 mg Oral Q6H PRN Laverle Hobby, PA-C   650 mg at 09/28/17 0830  . ALPRAZolam Duanne Moron) tablet 0.5 mg  0.5 mg Oral TID PRN Kollyns Mickelson, Myer Peer, MD   0.5 mg at 09/27/17 2145  . alum & mag hydroxide-simeth (MAALOX/MYLANTA) 200-200-20 MG/5ML suspension 30 mL  30 mL Oral Q4H PRN Patriciaann Clan E, PA-C      . feeding  supplement (ENSURE ENLIVE) (ENSURE ENLIVE) liquid 237 mL  237 mL Oral BID BM Germany Chelf, Myer Peer, MD   237 mL at 09/28/17 0829  . gabapentin (NEURONTIN) capsule 200 mg  200 mg Oral TID Patriciaann Clan E, PA-C   200 mg at 09/28/17 1205  . hydrOXYzine (ATARAX/VISTARIL) tablet 25 mg  25 mg Oral Q6H PRN Laverle Hobby, PA-C   25 mg at 09/27/17 2146  . lamoTRIgine (LAMICTAL) tablet 25 mg  25 mg Oral Daily Hussam Muniz, Myer Peer, MD   25 mg at 09/28/17 0829  . magnesium hydroxide (MILK OF MAGNESIA) suspension 30 mL  30 mL Oral Daily PRN Patriciaann Clan E, PA-C      . pantoprazole (PROTONIX) EC tablet 40 mg  40 mg Oral Daily Patriciaann Clan E, PA-C   40 mg at 09/28/17 0829  . potassium chloride SA (K-DUR,KLOR-CON) CR tablet 20 mEq  20 mEq Oral BID Haytham Maher, Myer Peer, MD   20 mEq at 09/28/17 0829  . propranolol (INDERAL) tablet 40 mg  40 mg Oral Daily Laverle Hobby, PA-C   40 mg at 09/28/17 3532  . QUEtiapine (SEROQUEL) tablet 150 mg  150 mg Oral QHS Money, Lowry Ram, FNP      . QUEtiapine (SEROQUEL) tablet 25 mg  25 mg Oral BH-q7a Karman Biswell, Myer Peer, MD   25 mg at 09/28/17 0636    Lab Results:  Results for orders placed or performed during the hospital encounter of 09/25/17 (from the past 48 hour(s))  Basic metabolic panel     Status: None   Collection Time: 09/28/17  6:17 AM  Result Value Ref Range   Sodium 140 135 - 145 mmol/L   Potassium 3.6 3.5 - 5.1 mmol/L   Chloride 104 101 - 111 mmol/L   CO2 26 22 - 32 mmol/L   Glucose, Bld 97 65 - 99 mg/dL   BUN 18 6 - 20 mg/dL   Creatinine, Ser 1.21 0.61 - 1.24 mg/dL   Calcium 9.3 8.9 - 10.3 mg/dL   GFR calc non Af Amer >60 >60 mL/min   GFR calc Af Amer >60 >60 mL/min    Comment: (NOTE) The eGFR has been calculated using the CKD EPI equation. This calculation has not been validated in all clinical situations. eGFR's persistently <60 mL/min signify possible Chronic Kidney Disease.    Anion gap 10 5 - 15    Comment: Performed at [facility], [place] [address] [place], [place] [address]    Blood Alcohol level:  No results found for: [facility]  Metabolic Disorder Labs: Lab Results  Component Value Date   HGBA1C 4.8 09/26/2017   MPG 91.06 09/26/2017   Lab Results  Component Value Date   PROLACTIN 23.6 (H) 09/26/2017   Lab Results  Component Value Date   CHOL 137 09/26/2017   TRIG 121 09/26/2017   HDL 60 09/26/2017   CHOLHDL 2.3  09/26/2017   VLDL 24 09/26/2017   LDLCALC 53 09/26/2017   LDLCALC 97 12/29/2014    Physical Findings: AIMS: Facial and Oral Movements Muscles of Facial Expression: None, normal Lips and Perioral Area: None, normal Jaw: None, normal Tongue: None, normal,Extremity Movements Upper (arms, wrists, hands, fingers): None, normal Lower (legs, knees, ankles, toes): None, normal, Trunk Movements Neck, shoulders, hips: None, normal, Overall Severity Severity of abnormal movements (highest score from questions above): None, normal Incapacitation due to abnormal movements: None, normal Patient's awareness of abnormal movements (rate only patient's report): No Awareness, Dental Status Current problems with teeth and/or dentures?: No Does patient usually wear dentures?: No  CIWA:  CIWA-Ar Total: 1 COWS:  COWS Total Score: 1  Musculoskeletal: Strength & Muscle Tone: within normal limits Gait & Station: normal Patient leans: N/A  Psychiatric Specialty Exam: Physical Exam  Nursing note and vitals reviewed. Constitutional: He is oriented to person, place, and time. He appears well-developed and well-nourished.  Cardiovascular: Normal rate.   Respiratory: Effort normal.  Musculoskeletal: Normal range of motion.  Neurological: He is alert and oriented to person, place, and time.  Skin: Skin is warm.    Review of Systems  Constitutional: Negative.   HENT: Negative.   Eyes: Negative.   Respiratory: Negative.   Cardiovascular: Negative.   Gastrointestinal: Negative.   Genitourinary:  Negative.   Musculoskeletal: Negative.   Skin: Negative.   Neurological: Negative.   Psychiatric/Behavioral: Positive for depression. Negative for hallucinations and suicidal ideas.    Blood pressure 119/83, pulse 89, temperature 97.9 F (36.6 C), temperature source Oral, resp. rate 18, height 5' 10" (1.778 m), weight 75.3 kg (166 lb).Body mass index is 23.82 kg/m.  General Appearance: Casual  Eye Contact:  Fair  Speech:  Clear and Coherent and Normal Rate  Volume:  Normal  Mood:  Depressed  Affect:  Flat  Thought Process:  Goal Directed and Descriptions of Associations: Intact  Orientation:  Full (Time, Place, and Person)  Thought Content:  WDL  Suicidal Thoughts:  No  Homicidal Thoughts:  No  Memory:  Immediate;   Good Recent;   Good Remote;   Good  Judgement:  Fair  Insight:  Fair  Psychomotor Activity:  Normal  Concentration:  Concentration: Good and Attention Span: Good  Recall:  Good  Fund of Knowledge:  Good  Language:  Good  Akathisia:  No  Handed:  Right  AIMS (if indicated):     Assets:  Communication Skills Desire for Improvement Financial Resources/Insurance Housing Physical Health Social Support Transportation  ADL's:  Intact  Cognition:  WNL  Sleep:  Number of Hours: 6.5   Problems Addressed: MDD severe GAD  Treatment Plan Summary: Daily contact with patient to assess and evaluate symptoms and progress in treatment, Medication management and Plan is to:  -Increase Seroquel 150 mg PO QHS for mood stability -Continue Seroquel 25 mg PO Daily for mood stability -Continue Lamictal 25 mg PO Daily for mood stability -Continue Vistaril 25 mg PO Q6H PRN for anxiety -Continue Xanax taper -Encourage group therapy participation  Lewis Shock, FNP 09/28/2017, 12:16 PM   Agree with NP Progress Note

## 2017-09-29 DIAGNOSIS — G4700 Insomnia, unspecified: Secondary | ICD-10-CM

## 2017-09-29 DIAGNOSIS — F332 Major depressive disorder, recurrent severe without psychotic features: Principal | ICD-10-CM

## 2017-09-29 DIAGNOSIS — Z818 Family history of other mental and behavioral disorders: Secondary | ICD-10-CM

## 2017-09-29 DIAGNOSIS — R454 Irritability and anger: Secondary | ICD-10-CM

## 2017-09-29 DIAGNOSIS — F39 Unspecified mood [affective] disorder: Secondary | ICD-10-CM

## 2017-09-29 DIAGNOSIS — Z810 Family history of intellectual disabilities: Secondary | ICD-10-CM

## 2017-09-29 DIAGNOSIS — F419 Anxiety disorder, unspecified: Secondary | ICD-10-CM

## 2017-09-29 MED ORDER — GABAPENTIN 100 MG PO CAPS
100.0000 mg | ORAL_CAPSULE | Freq: Three times a day (TID) | ORAL | Status: DC
  Administered 2017-09-29 – 2017-10-01 (×6): 100 mg via ORAL
  Filled 2017-09-29 (×11): qty 1

## 2017-09-29 MED ORDER — QUETIAPINE FUMARATE 200 MG PO TABS
200.0000 mg | ORAL_TABLET | Freq: Every day | ORAL | Status: DC
  Administered 2017-09-29 – 2017-09-30 (×2): 200 mg via ORAL
  Filled 2017-09-29: qty 1
  Filled 2017-09-29: qty 2
  Filled 2017-09-29 (×3): qty 1

## 2017-09-29 MED ORDER — TRAZODONE HCL 50 MG PO TABS
50.0000 mg | ORAL_TABLET | Freq: Every evening | ORAL | Status: DC | PRN
  Filled 2017-09-29: qty 1

## 2017-09-29 NOTE — Progress Notes (Addendum)
[facility] MD Progress Note  09/29/2017 3:45 PM Douglas Knight  MRN:  [id number]   Subjective: patient reports partial improvement, but describes ongoing depression and anxiety. Denies suicidal ideations and is future oriented. He states he is invested in going to [employer] early next week.  States today found out that his job terminated him earlier than anticipated . States he had already resigned, but that employer made decision to move up his last day at work. States some anxiety and irritability about this, but states he is trying to " find the silver lining ", and feels it will allow to focus on himself rather than on returning to work. Denies medication side effects thus far .    Objective: I have discussed case with treatment team and have met with patient. Patient presents with partially improved mood compared to admission. He presents less depressed, and affect is becoming more reactive. Denies current suicidal ideations and as above, he is future oriented . No disruptive or agitated behaviors on unit, going to groups. States that he is feeling better on Seroquel, but is still sleeping poorly, and spends significant amount of time at night " awake, thinking".    Principal Problem: MDD (major depressive disorder), recurrent episode, severe ([place]) Diagnosis:   Patient Active Problem List   Diagnosis Date Noted  . MDD (major depressive disorder), recurrent episode, severe ([place]) [F33.2] 09/25/2017  . Acute tension headache [G44.209] 05/15/2017  . Generalized anxiety disorder [F41.1] 05/15/2017  . Colon polyp [K63.5] 12/07/2016  . Irritable bowel syndrome [K58.9] 09/02/2016  . External hemorrhoid [K64.4] 09/02/2016  . Allergic rhinitis [J30.9] 04/29/2016  . Migraine [G43.909] 04/29/2016  . Vitamin D deficiency [E55.9] 11/17/2015  . Insomnia [G47.00] 07/27/2015  . Acne [L70.9] 03/09/2015  . Essential hypertension [I10] 02/27/2015  . Elevated serum creatinine [R79.89] 12/31/2014  .  Fatigue [R53.83] 12/29/2014  . Medication management [Z79.899] 12/29/2014  . Major depressive disorder, recurrent episode, moderate ([place]) [F33.1] 12/29/2014  . Chronic ITP (idiopathic thrombocytopenia) (HCC) [D69.3] 12/29/2014   Total Time spent with patient: 20 minutes  Past Psychiatric History: See H&P  Past Medical History:  Past Medical History:  Diagnosis Date  . Anal fissure   . Anxiety   . Depression   . Hypertension   . IBS (irritable bowel syndrome)   . ITP (idiopathic thrombocytopenic purpura)     Past Surgical History:  Procedure Laterality Date  . APPENDECTOMY  2003   [facility]   Family History:  Family History  Problem Relation Age of Onset  . Diabetes Father   . Hypertension Father   . Colon polyps Father   . Colon polyps Mother   . Heart disease Maternal Grandfather   . Alzheimer's disease Paternal Grandmother   . Depression Paternal Grandfather   . Colon polyps Paternal Grandfather   . Colon cancer Paternal Aunt    Family Psychiatric  History: See H&P Social History:  History  Alcohol Use  . 0.0 oz/week    Comment: rarely     History  Drug Use No    Social History   Social History  . Marital status: Single    Spouse name: N/A  . Number of children: 0  . Years of education: N/A   Occupational History  . social worker/addiction specialist    Social History Main Topics  . Smoking status: Never Smoker  . Smokeless tobacco: Never Used  . Alcohol use 0.0 oz/week     Comment: rarely  . Drug use:  No  . Sexual activity: Yes    Partners: Female    Birth control/ protection: None   Other Topics Concern  . None   Social History Narrative   01/2015 Currently working on Masters of SW at [employer]   Additional Social History:    Pain Medications: denies Prescriptions: denies Over the Counter: denies History of alcohol / drug use?: No history of alcohol / drug abuse Longest period of sobriety (when/how long): N/A  Sleep:  Fair  Appetite:  improving   Current Medications: Current Facility-Administered Medications  Medication Dose Route Frequency Provider Last Rate Last Dose  . acetaminophen (TYLENOL) tablet 650 mg  650 mg Oral Q6H PRN Laverle Hobby, PA-C   650 mg at 09/28/17 0830  . ALPRAZolam Duanne Moron) tablet 0.5 mg  0.5 mg Oral BID PRN Money, Lowry Ram, FNP      . alum & mag hydroxide-simeth (MAALOX/MYLANTA) 200-200-20 MG/5ML suspension 30 mL  30 mL Oral Q4H PRN Patriciaann Clan E, PA-C      . feeding supplement (ENSURE ENLIVE) (ENSURE ENLIVE) liquid 237 mL  237 mL Oral BID BM Cobos, Myer Peer, MD   237 mL at 09/28/17 0829  . gabapentin (NEURONTIN) capsule 200 mg  200 mg Oral TID Patriciaann Clan E, PA-C   200 mg at 09/29/17 1058  . hydrOXYzine (ATARAX/VISTARIL) tablet 25 mg  25 mg Oral Q6H PRN Laverle Hobby, PA-C   25 mg at 09/28/17 2225  . lamoTRIgine (LAMICTAL) tablet 25 mg  25 mg Oral Daily Cobos, Myer Peer, MD   25 mg at 09/29/17 0745  . magnesium hydroxide (MILK OF MAGNESIA) suspension 30 mL  30 mL Oral Daily PRN Patriciaann Clan E, PA-C      . pantoprazole (PROTONIX) EC tablet 40 mg  40 mg Oral Daily Patriciaann Clan E, PA-C   40 mg at 09/29/17 0745  . propranolol (INDERAL) tablet 40 mg  40 mg Oral Daily Laverle Hobby, PA-C   40 mg at 09/29/17 0745  . QUEtiapine (SEROQUEL) tablet 150 mg  150 mg Oral QHS Money, Lowry Ram, FNP   150 mg at 09/28/17 2104  . QUEtiapine (SEROQUEL) tablet 25 mg  25 mg Oral BH-q7a Cobos, Myer Peer, MD   25 mg at 09/29/17 9518    Lab Results:  Results for orders placed or performed during the hospital encounter of 09/25/17 (from the past 48 hour(s))  Basic metabolic panel     Status: None   Collection Time: 09/28/17  6:17 AM  Result Value Ref Range   Sodium 140 135 - 145 mmol/L   Potassium 3.6 3.5 - 5.1 mmol/L   Chloride 104 101 - 111 mmol/L   CO2 26 22 - 32 mmol/L   Glucose, Bld 97 65 - 99 mg/dL   BUN 18 6 - 20 mg/dL   Creatinine, Ser 1.21 0.61 - 1.24 mg/dL   Calcium  9.3 8.9 - 10.3 mg/dL   GFR calc non Af Amer >60 >60 mL/min   GFR calc Af Amer >60 >60 mL/min    Comment: (NOTE) The eGFR has been calculated using the CKD EPI equation. This calculation has not been validated in all clinical situations. eGFR's persistently <60 mL/min signify possible Chronic Kidney Disease.    Anion gap 10 5 - 15    Comment: Performed at [facility], [place] [address], [place], [place] [address]    Blood Alcohol level:  No results found for: [facility]  Metabolic Disorder Labs: Lab Results  Component Value Date   HGBA1C 4.8 09/26/2017   MPG 91.06 09/26/2017   Lab Results  Component Value Date   PROLACTIN 23.6 (H) 09/26/2017   Lab Results  Component Value Date   CHOL 137 09/26/2017   TRIG 121 09/26/2017   HDL 60 09/26/2017   CHOLHDL 2.3 09/26/2017   VLDL 24 09/26/2017   LDLCALC 53 09/26/2017   LDLCALC 97 12/29/2014    Physical Findings: AIMS: Facial and Oral Movements Muscles of Facial Expression: None, normal Lips and Perioral Area: None, normal Jaw: None, normal Tongue: None, normal,Extremity Movements Upper (arms, wrists, hands, fingers): None, normal Lower (legs, knees, ankles, toes): None, normal, Trunk Movements Neck, shoulders, hips: None, normal, Overall Severity Severity of abnormal movements (highest score from questions above): None, normal Incapacitation due to abnormal movements: None, normal Patient's awareness of abnormal movements (rate only patient's report): No Awareness, Dental Status Current problems with teeth and/or dentures?: No Does patient usually wear dentures?: No  CIWA:  CIWA-Ar Total: 1 COWS:  COWS Total Score: 1  Musculoskeletal: Strength & Muscle Tone: within normal limits Gait & Station: normal Patient leans: N/A  Psychiatric Specialty Exam: Physical Exam  Nursing note and vitals reviewed. Constitutional: He is oriented to person, place, and time. He appears well-developed and well-nourished.   Cardiovascular: Normal rate.   Respiratory: Effort normal.  Musculoskeletal: Normal range of motion.  Neurological: He is alert and oriented to person, place, and time.  Skin: Skin is warm.    Review of Systems  Constitutional: Negative.   HENT: Negative.   Eyes: Negative.   Respiratory: Negative.   Cardiovascular: Negative.   Gastrointestinal: Negative.   Genitourinary: Negative.   Musculoskeletal: Negative.   Skin: Negative.   Neurological: Negative.   Psychiatric/Behavioral: Positive for depression. Negative for hallucinations and suicidal ideas.  no chest pain, no dyspnea   Blood pressure 107/73, pulse (!) 108, temperature 98.4 F (36.9 C), temperature source Oral, resp. rate 18, height '5\' 10"'  (1.778 m), weight 75.3 kg (166 lb).Body mass index is 23.82 kg/m.  General Appearance: improving grooming   Eye Contact:  Good  Speech:  Normal Rate  Volume:  Normal  Mood:  less depressed, mood improving   Affect:  still constricted, but smiles at times appropriately   Thought Process:  Linear and Descriptions of Associations: Intact  Orientation:  Other:  fully alert and attentive  Thought Content:  denies hallucinations, no delusions, not internally preoccupied   Suicidal Thoughts:  No denies current suicidal plan or intention, denies homicidal or violent ideations  Homicidal Thoughts:  No  Memory:  Immediate;   Good Recent;   Good Remote;   Good  Judgement:  Other:  improving   Insight:  improving   Psychomotor Activity:  Normal  Concentration:  Concentration: Good and Attention Span: Good  Recall:  Good  Fund of Knowledge:  Good  Language:  Good  Akathisia:  No  Handed:  Right  AIMS (if indicated):     Assets:  Communication Skills Desire for Improvement Financial Resources/Insurance Housing Physical Health Social Support Transportation  ADL's:  Intact  Cognition:  WNL  Sleep:  Number of Hours: 6.75   Assessment - patient is presenting with partially improving  mood and range of affect . He has reported  History of feeling over-activated, labile , " jittery" on antidepressants in the past, and at this time is being managed with Lamictal and Seroquel. No side effects thus far , and reports he feels they are helping.  He reports ongoing insomnia. At this time future oriented, and interested in going to [facility] at discharge. No BZD WDL and agrees to taper off Xanax prior to discharge . Of note, he states he does not feel Neurontin is helping and it is now being tapered down/off gradually .  Treatment Plan Summary: Treatment Plan reviewed as below today 11/2  Daily contact with patient to assess and evaluate symptoms and progress in treatment, Medication management and Plan is to:  -Encourage group and milieu participation to work on coping skills and symptom reduction -Treatment team working on disposition planning - see above  -Increase Seroquel to 200 mg PO QHS for mood disorder and insomnia  -Continue Lamictal 25 mg PO QDAY for mood disorder, depression-  ( Medication side effects, including risk of severe rash on Lamotrigine, have been reviewed) -Continue Vistaril 25 mg PO Q6H PRN for anxiety -Decrease Neurontin to 100 mgrs TID for anxiety- see above  -Continue Xanax 0.5 mgrs BID PRN for anxiety as needed  -Encourage group therapy participation  Jenne Campus, MD 09/29/2017, 3:45 PM   Patient ID: Kipp Laurence, male   DOB: Jan 10, 1986, 31 y.o.   MRN: [id number]

## 2017-09-29 NOTE — Progress Notes (Signed)
Adult Psychoeducational Group Note  Date:  09/29/2017 Time:  10:14 PM  Group Topic/Focus:  Wrap-Up Group:   The focus of this group is to help patients review their daily goal of treatment and discuss progress on daily workbooks.  Participation Level:  Active  Participation Quality:  Appropriate  Affect:  Appropriate  Cognitive:  Appropriate  Insight: Appropriate  Engagement in Group:  Engaged  Modes of Intervention:  Discussion  Additional Comments:  Patient attended group and said that his day was a 7.  Patient explained that he was terminated from his job because he's in the hospital . Writer of this note asked patient to inform to the Social worker in the morning about his situation.   Tytionna Cloyd W Melessa Cowell 29/03/6212, 10:14 PM

## 2017-09-29 NOTE — Progress Notes (Signed)
DAR Note: Pt observed in the dayroom not interacting. Pt at the time of assessment endorsed moderate anxiety and insomnia; "I didn't sleep again last night; I need to sleep." Pt denied SI, HI, AVH or pain. Pt was med compliant. PT remained calm and cooperative.

## 2017-09-29 NOTE — Progress Notes (Signed)
DAR Note: Pt observed in the dayroom not interacting. Pt at the time of assessment endorsed moderate anxiety. Pt also complained of insomnia; "I Think I will feel better if I can just sleep." Pt denied SI, HI, AVH or pain. Pt was med compliant. PT remained calm and cooperative.

## 2017-09-29 NOTE — Progress Notes (Signed)
DAR NOTE: Patient presents with anxious calm with a  depressed mood.  Denies pain, auditory and visual hallucinations. Complained of not sleeping well last night, had racing thoughts last night, but good thoughts.  Rates depression at 5, hopelessness at 4, and anxiety at 7.  Maintained on routine safety checks.  Medications given as prescribed.  Support and encouragement offered as needed.  Attended group and participated.  States goal for today is "stay solution focused and stay in a just for today."  Patient observed socializing with peers in the dayroom.  Offered no complaint.

## 2017-09-30 NOTE — [facility] Group Notes (Signed)
Life SKills   Date:  09/30/2017  Time:  1330  Type of Therapy:  Nurse Education  /  Life SKills   The group focuses on teaching  Patients how to identify their needs and then how to develop the skills needed to get them met.   Participation Level:  Active  Participation Quality:  Attentive  Affect:  Appropriate  Cognitive:  Alert  Insight:  Improving  Engagement in Group:  Engaged  Modes of Intervention:  Education  Summary of Progress/Problems:  Lauralyn Primes 09/30/2017, 3:01 PM

## 2017-09-30 NOTE — Progress Notes (Signed)
D.Patient presents with a sad affect with congruent behavior upon initial approach. Pt currently denies SI/HI and AVH and agrees to contact staff before acting on any harmful thoughts. Pt completed his self inventory. Pt reports his depression and hopelessness a 4/10 and anxiety a 6/10. Pt reports that his goal today is to "remain focused- reduce negative self talk", "to make a gratitude list, recognize what is within my control and follow through". Pt reports having lost his job yesterday and is wanting to move his discharge to today if possible due to a temporary loss of insurance. A. Labs and vitals monitored. Pt compliant with medications. Pt supported emotionally and encouraged to express concerns and ask questions.   R. Pt remains safe with 15 minute checks. Will continue POC.

## 2017-09-30 NOTE — Progress Notes (Signed)
Adult Psychoeducational Group Note  Date:  09/30/2017 Time:  11:04 PM  Group Topic/Focus:  Wrap-Up Group:   The focus of this group is to help patients review their daily goal of treatment and discuss progress on daily workbooks.  Participation Level:  Active  Participation Quality:  Appropriate  Affect:  Appropriate  Cognitive:  Appropriate  Insight: Appropriate  Engagement in Group:  Engaged  Modes of Intervention:  Discussion  Additional Comments:   Patient attended group and said that his day was a 8.5. He was excited because he will be discharged tomorrow.   Waynetta Metheny W Tino Ronan 97/07/4800, 11:04 PM

## 2017-09-30 NOTE — Progress Notes (Signed)
D: Patient reports much improvement in his mood. Patient states that he slept well last night and nap during the day. Stated that he is very much prepared for his discharge tomorrow. Reports going home from here then on Tuesday, will be going to be admitted in a long term inpatient facility in [place]. Denies pain, SI/HI, AH/VH at this time. Accepted his bedtime med. Verbalized no concern . No behavior issues noted.   A: Staff offered support and encouragement as needed. Routine safety checks maintained. Will continue to monitor patient  R: Patient remains safe and appropriate on unit.

## 2017-09-30 NOTE — [facility] Group Notes (Signed)
[facility] LCSW Group Therapy Note  Date/Time:    09/30/2017 10:00-11:00AM  Type of Therapy and Topic:  Group Therapy:  Healthy vs Unhealthy Coping Skills  Participation Level:  Minimal   Description of Group:  The focus of this group was to determine what unhealthy coping techniques typically are used by group members and what healthy coping techniques would be helpful in coping with various problems. Patients were guided in becoming aware of the differences between healthy and unhealthy coping techniques.  Patients were asked to identify 1-2 healthy coping skills they would like to learn to use more effectively, and many mentioned meditation, breathing, and relaxation.  These were explained, samples demonstrated, and resources shared for how to learn more at discharge.   At the end of group, additional ideas of healthy coping skills were shared in a fun exercise.  Therapeutic Goals 1. Patients learned that coping is what human beings do all day long to deal with various situations in their lives 2. Patients defined and discussed healthy vs unhealthy coping techniques 3. Patients identified their preferred coping techniques and identified whether these were healthy or unhealthy 4. Patients determined 1-2 healthy coping skills they would like to become more familiar with and use more often, and practiced a few meditations 5. Patients provided support and ideas to each other  Summary of Patient Progress: During group, patient expressed he engages in a great deal of negative self-talk and needs to learn to stay solution-focused.  He spoke very little during group but did appear to follow the discussion.   Therapeutic Modalities Cognitive Behavioral Therapy Motivational Interviewing   Selmer Dominion, LCSW 09/30/2017, 12:37 PM

## 2017-09-30 NOTE — Progress Notes (Signed)
[facility] MD Progress Note  09/30/2017 3:32 PM Douglas Knight  MRN:  [id number]   Subjective: patient reports partial improvement of mood and affect. States mood varies during the day with some periods during the day where he still feels depressed, but in general he is feeling better than prior to admission. He also states he is sleeping better.  He denies suicidal ideations, and is future oriented.   Denies medication side effects    Objective: I have reviewed chart notes and have met with patient.  As per staff notes patient continues to present with some depression, sad affect, not endorsing suicidal or self injurious ideations. Currently he reports he is feeling better, less depressed, and states he is sleeping better. At present denies suicidal ideations and remains future oriented with plan of going to [facility] after discharge.  Less ruminative about recent job related stressors ( had resigned job, but found out recently termination date had been moved up). No disruptive or agitated behaviors on unit. Denies medication side effects.  Principal Problem: MDD (major depressive disorder), recurrent episode, severe ([place]) Diagnosis:   Patient Active Problem List   Diagnosis Date Noted  . MDD (major depressive disorder), recurrent episode, severe ([place]) [F33.2] 09/25/2017  . Acute tension headache [G44.209] 05/15/2017  . Generalized anxiety disorder [F41.1] 05/15/2017  . Colon polyp [K63.5] 12/07/2016  . Irritable bowel syndrome [K58.9] 09/02/2016  . External hemorrhoid [K64.4] 09/02/2016  . Allergic rhinitis [J30.9] 04/29/2016  . Migraine [G43.909] 04/29/2016  . Vitamin D deficiency [E55.9] 11/17/2015  . Insomnia [G47.00] 07/27/2015  . Acne [L70.9] 03/09/2015  . Essential hypertension [I10] 02/27/2015  . Elevated serum creatinine [R79.89] 12/31/2014  . Fatigue [R53.83] 12/29/2014  . Medication management [Z79.899] 12/29/2014  . Major depressive disorder, recurrent episode,  moderate ([place]) [F33.1] 12/29/2014  . Chronic ITP (idiopathic thrombocytopenia) ([facility]) [D69.3] 12/29/2014   Total Time spent with patient: 15 minutes  Past Psychiatric History: See H&P  Past Medical History:  Past Medical History:  Diagnosis Date  . Anal fissure   . Anxiety   . Depression   . Hypertension   . IBS (irritable bowel syndrome)   . ITP (idiopathic thrombocytopenic purpura)     Past Surgical History:  Procedure Laterality Date  . APPENDECTOMY  2003   [facility]   Family History:  Family History  Problem Relation Age of Onset  . Diabetes Father   . Hypertension Father   . Colon polyps Father   . Colon polyps Mother   . Heart disease Maternal Grandfather   . Alzheimer's disease Paternal Grandmother   . Depression Paternal Grandfather   . Colon polyps Paternal Grandfather   . Colon cancer Paternal Aunt    Family Psychiatric  History: See H&P Social History:  History  Alcohol Use  . 0.0 oz/week    Comment: rarely     History  Drug Use No    Social History   Social History  . Marital status: Single    Spouse name: N/A  . Number of children: 0  . Years of education: N/A   Occupational History  . social worker/addiction specialist    Social History Main Topics  . Smoking status: Never Smoker  . Smokeless tobacco: Never Used  . Alcohol use 0.0 oz/week     Comment: rarely  . Drug use: No  . Sexual activity: Yes    Partners: Female    Birth control/ protection: None   Other Topics Concern  . None  Social History Narrative   01/2015 Currently working on Masters of SW at [employer]   Additional Social History:    Pain Medications: denies Prescriptions: denies Over the Counter: denies History of alcohol / drug use?: No history of alcohol / drug abuse Longest period of sobriety (when/how long): N/A  Sleep: improving   Appetite:  improving   Current Medications: Current Facility-Administered Medications  Medication Dose Route  Frequency Provider Last Rate Last Dose  . acetaminophen (TYLENOL) tablet 650 mg  650 mg Oral Q6H PRN Laverle Hobby, PA-C   650 mg at 09/28/17 0830  . ALPRAZolam Duanne Moron) tablet 0.5 mg  0.5 mg Oral BID PRN Money, Lowry Ram, FNP      . alum & mag hydroxide-simeth (MAALOX/MYLANTA) 200-200-20 MG/5ML suspension 30 mL  30 mL Oral Q4H PRN Patriciaann Clan E, PA-C      . feeding supplement (ENSURE ENLIVE) (ENSURE ENLIVE) liquid 237 mL  237 mL Oral BID BM , Myer Peer, MD   237 mL at 09/28/17 0829  . gabapentin (NEURONTIN) capsule 100 mg  100 mg Oral TID ,  A, MD   100 mg at 09/30/17 1142  . hydrOXYzine (ATARAX/VISTARIL) tablet 25 mg  25 mg Oral Q6H PRN Laverle Hobby, PA-C   25 mg at 09/29/17 2121  . lamoTRIgine (LAMICTAL) tablet 25 mg  25 mg Oral Daily , Myer Peer, MD   25 mg at 09/30/17 0836  . magnesium hydroxide (MILK OF MAGNESIA) suspension 30 mL  30 mL Oral Daily PRN Patriciaann Clan E, PA-C      . pantoprazole (PROTONIX) EC tablet 40 mg  40 mg Oral Daily Patriciaann Clan E, PA-C   40 mg at 09/30/17 0837  . propranolol (INDERAL) tablet 40 mg  40 mg Oral Daily Laverle Hobby, PA-C   40 mg at 09/30/17 2025  . QUEtiapine (SEROQUEL) tablet 200 mg  200 mg Oral QHS , Myer Peer, MD   200 mg at 09/29/17 2121    Lab Results:  No results found for this or any previous visit (from the past 48 hour(s)).  Blood Alcohol level:  No results found for: [facility]  Metabolic Disorder Labs: Lab Results  Component Value Date   HGBA1C 4.8 09/26/2017   MPG 91.06 09/26/2017   Lab Results  Component Value Date   PROLACTIN 23.6 (H) 09/26/2017   Lab Results  Component Value Date   CHOL 137 09/26/2017   TRIG 121 09/26/2017   HDL 60 09/26/2017   CHOLHDL 2.3 09/26/2017   VLDL 24 09/26/2017   LDLCALC 53 09/26/2017   LDLCALC 97 12/29/2014    Physical Findings: AIMS: Facial and Oral Movements Muscles of Facial Expression: None, normal Lips and Perioral Area: None, normal Jaw: None,  normal Tongue: None, normal,Extremity Movements Upper (arms, wrists, hands, fingers): None, normal Lower (legs, knees, ankles, toes): None, normal, Trunk Movements Neck, shoulders, hips: None, normal, Overall Severity Severity of abnormal movements (highest score from questions above): None, normal Incapacitation due to abnormal movements: None, normal Patient's awareness of abnormal movements (rate only patient's report): No Awareness, Dental Status Current problems with teeth and/or dentures?: No Does patient usually wear dentures?: No  CIWA:  CIWA-Ar Total: 1 COWS:  COWS Total Score: 1  Musculoskeletal: Strength & Muscle Tone: within normal limits Gait & Station: normal Patient leans: N/A  Psychiatric Specialty Exam: Physical Exam  Nursing note and vitals reviewed. Constitutional: He is oriented to person, place, and time. He appears well-developed and well-nourished.  Cardiovascular:  Normal rate.   Respiratory: Effort normal.  Musculoskeletal: Normal range of motion.  Neurological: He is alert and oriented to person, place, and time.  Skin: Skin is warm.    Review of Systems  Constitutional: Negative.   HENT: Negative.   Eyes: Negative.   Respiratory: Negative.   Cardiovascular: Negative.   Gastrointestinal: Negative.   Genitourinary: Negative.   Musculoskeletal: Negative.   Skin: Negative.   Neurological: Negative.   Psychiatric/Behavioral: Positive for depression. Negative for hallucinations and suicidal ideas.  no chest pain, no dyspnea   Blood pressure 118/80, pulse 91, temperature 97.8 F (36.6 C), temperature source Oral, resp. rate 18, height 5' 10" (1.778 m), weight 75.3 kg (166 lb).Body mass index is 23.82 kg/m.  General Appearance: Well Groomed  Eye Contact:  Good  Speech:  Normal Rate  Volume:  Normal  Mood:  less depressed   Affect:  improving , more reactive   Thought Process:  Linear and Descriptions of Associations: Intact  Orientation:  Other:   fully alert and attentive  Thought Content:  denies hallucinations, no delusions, not internally preoccupied   Suicidal Thoughts:  No denies current suicidal plan or intention, denies homicidal or violent ideations  Homicidal Thoughts:  No  Memory:  Immediate;   Good Recent;   Good Remote;   Good  Judgement:  Other:  improving   Insight:  improving   Psychomotor Activity:  Normal  Concentration:  Concentration: Good and Attention Span: Good  Recall:  Good  Fund of Knowledge:  Good  Language:  Good  Akathisia:  No  Handed:  Right  AIMS (if indicated):     Assets:  Communication Skills Desire for Improvement Financial Resources/Insurance Housing Physical Health Social Support Transportation  ADL's:  Intact  Cognition:  WNL  Sleep:  Number of Hours: 6.75   Assessment -  Patient reports lingering depression, but overall endorses improving mood and presents with a fuller range of affect and less negative ruminations. He is future oriented , focused on going to [facility] in [place]. Slept better last night, and so far tolerating medications well . Reports he has not taken Xanax PRNs x 2-3 days, and is not presenting with any psychotic symptoms, he is motivated in stopping this medication.  Treatment Plan Summary: Treatment Plan reviewed as below today 11/3 Daily contact with patient to assess and evaluate symptoms and progress in treatment, Medication management and Plan is to:  -Encourage group and milieu participation to work on coping skills and symptom reduction -Treatment team working on disposition planning - see above  -Continue  Seroquel  200 mg PO QHS for mood disorder and insomnia  -Continue Lamictal 25 mg PO QDAY for mood disorder, depression. -Continue Vistaril 25 mg PO Q6H PRN for anxiety -Continue Neurontin  100 mgrs TID for anxiety- see above  -Discontinue Xanax- see above   Jenne Campus, MD 09/30/2017, 3:32 PM   Patient ID: Douglas Knight, male   DOB:  01-29-1986, 31 y.o.   MRN: [id number]

## 2017-10-01 MED ORDER — PROPRANOLOL HCL 40 MG PO TABS: 40.0000 mg | ORAL_TABLET | Freq: Every day | ORAL | 0 refills | Status: DC

## 2017-10-01 MED ORDER — GABAPENTIN 100 MG PO CAPS: 100.0000 mg | ORAL_CAPSULE | Freq: Three times a day (TID) | ORAL | 0 refills | Status: DC

## 2017-10-01 MED ORDER — QUETIAPINE FUMARATE 200 MG PO TABS: 200.0000 mg | ORAL_TABLET | Freq: Every day | ORAL | 0 refills | Status: DC

## 2017-10-01 MED ORDER — LAMOTRIGINE 25 MG PO TABS: 25.0000 mg | ORAL_TABLET | Freq: Every day | ORAL | 0 refills | Status: DC

## 2017-10-01 NOTE — Discharge Summary (Signed)
Physician Discharge Summary Note  Patient:  Douglas Knight is an 31 y.o., male MRN:  [id number] DOB:  [birth year] Patient phone:  [phone] (home)  Patient address:   [place] [address],  Total Time spent with patient: 30 minutes  Date of Admission:  09/25/2017 Date of Discharge: 110/02/2017  Reason for Admission:  Noted by [facility] Counselor Assessment:on 09/24/2017- 12 y.o.single male, who voluntarily came into [facility], accompanied by his girlfriend. Patient reported suicidal ideations with a plan to overdose and cut his wrists with a knife that he has access to in his home. Patient stated that the ideations have been ongoing for approximately 6 months. Patient reported 1 previous attempt in 2007. Patient stated that, in 2007, he made a list of things that he would do to attempt suicide. Patient reported having recurrent nightmares, during the last 6 months, of some of the acts to commit suicide on the listhe made in 2007, such as hanging himself and laying in traffic. Patient reported ongoing experiences with depressive symptoms, such as despondency, fatigue, isolation, tearfulness, feelings of worthlessness, guilt, loss of interest in previously enjoyable activities, and anger. Patient denies homicidal ideations, auditory/visual hallucinations, self-injurious behaviors, substance use, or access to any other weapons.  Patient reported currently residing with his girlfriend. Patient identified "everyone" in his family as protective factors. Patient identified recent stressors associated with him being involved in an affair. Patient stated he has been upset due to "the lies" being "dishonest." Patient stated that he experienced a loss in weight, of 30lbs, during the previous 4 months. Patient reported no arrests, probation/parole, or upcoming court dates. Patient denies any physical, sexual, or verbal abuse. Patient reported a paternal family history of substance abuse.  Patient stated receiving inpatient treatment for suicidal ideation and depression, in 2007, at [facility]. Patient reported currently seeing a psychiatrist, Dr. Mallie Snooks, and a therapist, Neldon Labella. Patient stated that he is currently taking Xanax and has been compliant with medication.  During assessment, Patient was calm and cooperative. Patient was appropriately dressed in personal clothing. Patient was oriented to person, time, location, and situation. Patient's eye contact was poor. Patient's motor activity consisted of freedom of movement. Patient's speech was logical, coherent, and soft. Patient's level of consciousness was alert and consisted of crying. Patient's mood appeared to be depressed and despaired. Patient's affect was depressed and appropriate to circumstance. Patient's thought process was coherent, relevant, and circumstantial. Patient's judgment appeared to be unimpaired.    Principal Problem: MDD (major depressive disorder), recurrent episode, severe [facility]) Discharge Diagnoses: Patient Active Problem List   Diagnosis Date Noted  . MDD (major depressive disorder), recurrent episode, severe ([place]) [F33.2] 09/25/2017  . Acute tension headache [G44.209] 05/15/2017  . Generalized anxiety disorder [F41.1] 05/15/2017  . Colon polyp [K63.5] 12/07/2016  . Irritable bowel syndrome [K58.9] 09/02/2016  . External hemorrhoid [K64.4] 09/02/2016  . Allergic rhinitis [J30.9] 04/29/2016  . Migraine [G43.909] 04/29/2016  . Vitamin D deficiency [E55.9] 11/17/2015  . Insomnia [G47.00] 07/27/2015  . Acne [L70.9] 03/09/2015  . Essential hypertension [I10] 02/27/2015  . Elevated serum creatinine [R79.89] 12/31/2014  . Fatigue [R53.83] 12/29/2014  . Medication management [Z79.899] 12/29/2014  . Major depressive disorder, recurrent episode, moderate ([place]) [F33.1] 12/29/2014  . Chronic ITP (idiopathic thrombocytopenia) (HCC) [D69.3] 12/29/2014    Past  Psychiatric History:   Past Medical History:  Past Medical History:  Diagnosis Date  . Anal fissure   . Anxiety   . Depression   .  Hypertension   . IBS (irritable bowel syndrome)   . ITP (idiopathic thrombocytopenic purpura)     Past Surgical History:  Procedure Laterality Date  . APPENDECTOMY  2003   [facility]   Family History:  Family History  Problem Relation Age of Onset  . Diabetes Father   . Hypertension Father   . Colon polyps Father   . Colon polyps Mother   . Heart disease Maternal Grandfather   . Alzheimer's disease Paternal Grandmother   . Depression Paternal Grandfather   . Colon polyps Paternal Grandfather   . Colon cancer Paternal Aunt    Family Psychiatric  History:  Social History:  Social History   Substance and Sexual Activity  Alcohol Use Yes  . Alcohol/week: 0.0 oz   Comment: rarely     Social History   Substance and Sexual Activity  Drug Use No    Social History   Socioeconomic History  . Marital status: Single    Spouse name: None  . Number of children: 0  . Years of education: None  . Highest education level: None  Social Needs  . Financial resource strain: None  . Food insecurity - worry: None  . Food insecurity - inability: None  . Transportation needs - medical: None  . Transportation needs - non-medical: None  Occupational History  . Occupation: Magazine features editor  Tobacco Use  . Smoking status: Never Smoker  . Smokeless tobacco: Never Used  Substance and Sexual Activity  . Alcohol use: Yes    Alcohol/week: 0.0 oz    Comment: rarely  . Drug use: No  . Sexual activity: Yes    Partners: Female    Birth control/protection: None  Other Topics Concern  . None  Social History Narrative   01/2015 Currently working on Masters of SW at [employer] Course:  Douglas Knight was admitted for MDD (major depressive disorder), recurrent episode, severe ([place])  and crisis management.  Pt was  treated discharged with the medications listed below under Medication List.  Medical problems were identified and treated as needed.  Home medications were restarted as appropriate.  Improvement was monitored by observation and Douglas Knight 's daily report of symptom reduction.  Emotional and mental status was monitored by daily self-inventory reports completed by Douglas Knight and clinical staff.         Douglas Knight was evaluated by the treatment team for stability and plans for continued recovery upon discharge. Douglas Knight 's motivation was an integral factor for scheduling further treatment. Employment, transportation, bed availability, health status, family support, and any pending legal issues were also considered during hospital stay. Pt was offered further treatment options upon discharge including but not limited to Residential, Intensive Outpatient, and Outpatient treatment.  Douglas Knight will follow up with the services as listed below under Follow Up Information.     Upon completion of this admission the patient was both mentally and medically stable for discharge denying suicidal/homicidal ideation, auditory/visual/tactile hallucinations, delusional thoughts and paranoia.    Douglas Knight responded well to treatment with Lamictal 25 Knight and Douglas Knight without adverse effects. Pt demonstrated improvement without reported or observed adverse effects to the point of stability appropriate for outpatient management. Pertinent labs include: CMP and CBC for which outpatient follow-up is necessary for lab recheck as mentioned below. Reviewed CBC, CMP, BAL, and UDS + Benzodiazepines ; all unremarkable aside from noted exceptions.  Physical Findings: AIMS: Facial and Oral Movements  Muscles of Facial Expression: None, normal Lips and Perioral Area: None, normal Jaw: None, normal Tongue: None, normal,Extremity Movements Upper (arms, wrists, hands, fingers): None,  normal Lower (legs, knees, ankles, toes): None, normal, Trunk Movements Neck, shoulders, hips: None, normal, Overall Severity Severity of abnormal movements (highest score from questions above): None, normal Incapacitation due to abnormal movements: None, normal Patient's awareness of abnormal movements (rate only patient's report): No Awareness, Dental Status Current problems with teeth and/or dentures?: No Does patient usually wear dentures?: No  CIWA:  CIWA-Ar Total: 1 COWS:  COWS Total Score: 1  Musculoskeletal: Strength & Muscle Tone: within normal limits Gait & Station: normal Patient leans: N/A  Psychiatric Specialty Exam: See SRA by MD Physical Exam  Nursing note and vitals reviewed. Constitutional: He is oriented to person, place, and time. He appears well-developed.  Cardiovascular: Normal rate.  Neurological: He is alert and oriented to person, place, and time.  Psychiatric: He has a normal mood and affect.    Review of Systems  Psychiatric/Behavioral: Negative for depression (STABLE) and suicidal ideas. The patient is not nervous/anxious.     Blood pressure 126/83, pulse 97, temperature 98.8 F (37.1 C), temperature source Oral, resp. rate 18, height 5\' 10"  (1.778 m), weight 75.3 kg (166 lb).Body mass index is 23.82 kg/m.    Have you used any form of tobacco in the last 30 days? (Cigarettes, Smokeless Tobacco, Cigars, and/or Pipes): No  Has this patient used any form of tobacco in the last 30 days? (Cigarettes, Smokeless Tobacco, Cigars, and/or Pipes)  No  Blood Alcohol level:  No results found for: [facility]  Metabolic Disorder Labs:  Lab Results  Component Value Date   HGBA1C 4.8 09/26/2017   MPG 91.06 09/26/2017   Lab Results  Component Value Date   PROLACTIN 23.6 (H) 09/26/2017   Lab Results  Component Value Date   CHOL 137 09/26/2017   TRIG 121 09/26/2017   HDL 60 09/26/2017   CHOLHDL 2.3 09/26/2017   VLDL 24 09/26/2017   LDLCALC 53 09/26/2017    [place] 97 12/29/2014    See Psychiatric Specialty Exam and Suicide Risk Assessment completed by Attending Physician prior to discharge.  Discharge destination:  Home  Is patient on multiple antipsychotic therapies at discharge: no Do you recommend tapering to monotherapy for antipsychotics?  No   Has Patient had three or more failed trials of antipsychotic monotherapy by history:  No  Recommended Plan for Multiple Antipsychotic Therapies: NA   Allergies as of 10/01/2017      Reactions   Sulfa Antibiotics Anaphylaxis, Hives, Other (See Comments)   Stomach pain   Wellbutrin [bupropion] Shortness Of Breath      Medication List    STOP taking these medications   ALPRAZolam 1 Knight tablet Commonly known as:  XANAX   BELSOMRA 20 Knight Tabs Generic drug:  Suvorexant   docusate sodium 100 Knight capsule Commonly known as:  COLACE     TAKE these medications     Indication  gabapentin 100 Knight capsule Commonly known as:  NEURONTIN Take 1 capsule (100 Knight total) 3 (three) times daily by mouth. What changed:    how much to take  when to take this  additional instructions  Another medication with the same name was removed. Continue taking this medication, and follow the directions you see here.  Indication:  Agitation, MOOD STABILIZATION   lamoTRIgine 25 Knight tablet Commonly known as:  LAMICTAL Take 1 tablet (25 Knight total) daily by mouth.  Start taking on:  10/02/2017  Indication:  Epilepsy   propranolol 40 Knight tablet Commonly known as:  INDERAL Take 1 tablet (40 Knight total) daily by mouth. Start taking on:  10/02/2017  Indication:  Idiopathic Hypertrophic Subaortic Stenosis   QUEtiapine 200 Knight tablet Commonly known as:  SEROQUEL Take 1 tablet (200 Knight total) at bedtime by mouth.  Indication:  Depressive Phase of Manic-Depression      Follow-up Information    [facility] Follow up on 10/03/2017.   Why:  Please present this day at 12:30pm for admission to this facility for continued  treatment.  Contact information: [address] [place], [place] [address] [phone] Fax:           Follow-up recommendations:  Activity:  as tolerated Diet:  heart healthy  Comments:  Take all medications as prescribed. Keep all follow-up appointments as scheduled.  Do not consume alcohol or use illegal drugs while on prescription medications. Report any adverse effects from your medications to your primary care provider promptly.  In the event of recurrent symptoms or worsening symptoms, call 911, a crisis hotline, or go to the nearest emergency department for evaluation.   Signed: Derrill Center, NP 10/01/2017, 1:46 PM

## 2017-10-01 NOTE — BHH Group Notes (Signed)
Healthy Support Systems   Date:  10/01/2017  Time:  1300  Type of Therapy:  Nurse Education  /  Healthy SUpport Systems The group focuses on teaching patients how to develop healthy support systems that will aide them in their recovery.  Participation Level:  Active  Participation Quality:  Attentive  Affect:  Appropriate  Cognitive:  Alert  Insight:  Appropriate  Engagement in Group:  Engaged  Modes of Intervention:  Education  Summary of Progress/Problems:  Douglas Knight 10/01/2017, 7:07 PM

## 2017-10-01 NOTE — Progress Notes (Signed)
  [facility] Adult Case Management Discharge Plan :  Will you be returning to the same living situation after discharge:  No.  Briefly going home, then to rehab At discharge, do you have transportation home?: Yes,  arranged Do you have the ability to pay for your medications: Yes,  no barriers  Release of information consent forms completed and in the chart;  Patient's signature needed at discharge.  Patient to Follow up at: Follow-up Information    [facility] Follow up on 10/03/2017.   Why:  Please present this day at 12:30pm for admission to this facility for continued treatment.  Contact information: [address] [place], [place] [address] [phone] Fax:           Next level of care provider has access to [place] and Suicide Prevention discussed: No. Refused  Have you used any form of tobacco in the last 30 days? (Cigarettes, Smokeless Tobacco, Cigars, and/or Pipes): No  Has patient been referred to the Quitline?: N/A patient is not a smoker  Patient has been referred for addiction treatment: Yes  Maretta Los, LCSW 10/01/2017, 1:18 PM

## 2017-10-01 NOTE — BHH Suicide Risk Assessment (Addendum)
[facility] Discharge Suicide Risk Assessment   Principal Problem: MDD (major depressive disorder), recurrent episode, severe ([place]) Discharge Diagnoses:  Patient Active Problem List   Diagnosis Date Noted  . MDD (major depressive disorder), recurrent episode, severe ([place]) [F33.2] 09/25/2017  . Acute tension headache [G44.209] 05/15/2017  . Generalized anxiety disorder [F41.1] 05/15/2017  . Colon polyp [K63.5] 12/07/2016  . Irritable bowel syndrome [K58.9] 09/02/2016  . External hemorrhoid [K64.4] 09/02/2016  . Allergic rhinitis [J30.9] 04/29/2016  . Migraine [G43.909] 04/29/2016  . Vitamin D deficiency [E55.9] 11/17/2015  . Insomnia [G47.00] 07/27/2015  . Acne [L70.9] 03/09/2015  . Essential hypertension [I10] 02/27/2015  . Elevated serum creatinine [R79.89] 12/31/2014  . Fatigue [R53.83] 12/29/2014  . Medication management [Z79.899] 12/29/2014  . Major depressive disorder, recurrent episode, moderate ([place]) [F33.1] 12/29/2014  . Chronic ITP (idiopathic thrombocytopenia) (HCC) [D69.3] 12/29/2014    Total Time spent with patient: 30 minutes  Musculoskeletal: Strength & Muscle Tone: within normal limits Gait & Station: normal Patient leans: N/A  Psychiatric Specialty Exam: ROS denies headache, no chest pain, no shortness of breath, no vomiting , no diarrhea, no fever   Blood pressure 126/83, pulse 97, temperature 98.8 F (37.1 C), temperature source Oral, resp. rate 18, height 5\' 10"  (1.778 m), weight 75.3 kg (166 lb).Body mass index is 23.82 kg/m.  General Appearance: Well Groomed  Eye Contact::  Good  Speech:  Normal Rate409  Volume:  Normal  Mood:  reports his mood is " all right", denies depression , presents with improved mood and fuller range of affect   Affect:  Appropriate and Full Range  Thought Process:  Linear and Descriptions of Associations: Intact  Orientation:  Full (Time, Place, and Person)  Thought Content:  denies hallucinations, no delusions, not internally  preoccupied   Suicidal Thoughts:  No denies suicidal or self injurious ideations, denies homicidal or violent ideations   Homicidal Thoughts:  No  Memory:  recent and remote grossly intact   Judgement:  Other:  improving   Insight:  improving  Psychomotor Activity:  Normal  Concentration:  Good  Recall:  Good  Fund of Knowledge:Good  Language: Good  Akathisia:  Negative  Handed:  Right  AIMS (if indicated):   no abnormal or involuntary movements noted   Assets:  Communication Skills Desire for Improvement Resilience  Sleep:  Number of Hours: 7.75  Cognition: WNL  ADL's:  Intact   Mental Status Per Nursing Assessment::   On Admission:  Suicidal ideation indicated by patient  Demographic Factors:  31 year old single male, non children, lives with GF, currently unemployed  Loss Factors: Relationship stressors, recently resigned his job and is currently unemployed   Historical Factors: History of depression, denies history of psychosis, reports history of becoming activated with different antidepressant trials in the past, but does not endorse any clear history of mania.   Risk Reduction Factors:   Sense of responsibility to family, Living with another person, especially a relative and Positive coping skills or problem solving skills  Continued Clinical Symptoms:  At this time patient is alert, attentive, well related, pleasant, mood is "OK", presents euthymic, affect is appropriate, no thought disorder, no suicidal or self injurious ideations, no homicidal or violent ideations, no psychotic symptoms. Future oriented . Behavior on unit calm and in good control. Denies medication side effects. We reviewed medication side effects, including potential for weight gain, metabolic disorders, movement disorders, and sedation on Seroquel.  Cognitive Features That Contribute To Risk:  No gross  cognitive deficits noted upon discharge. Is alert , attentive, and oriented x 3   Suicide  Risk:  Mild:  Suicidal ideation of limited frequency, intensity, duration, and specificity.  There are no identifiable plans, no associated intent, mild dysphoria and related symptoms, good self-control (both objective and subjective assessment), few other risk factors, and identifiable protective factors, including available and accessible social support.  Follow-up Information    [facility] Follow up on 10/03/2017.   Why:  Please present this day at 12:30pm for admission to this facility for continued treatment.  Contact information: [address] [place], [place] [address] [phone] Fax:           Plan Of Care/Follow-up recommendations:  Activity:  as tolerated  Diet:  regular Tests:  NA Other:  see below  Patient is expressing readiness for discharge and is leaving unit in good spirits  Patient is leaving unit in good spirits Plans to return home briefly and then go to to [facility]- residential program- on 11/6 .   Jenne Campus, MD 10/01/2017, 1:06 PM

## 2017-10-01 NOTE — BHH Group Notes (Signed)
[facility] LCSW Group Therapy Note  Date/Time:  10/01/2017 10:00-11:00AM  Type of Therapy and Topic:  Group Therapy:  Healthy and Unhealthy Supports  Participation Level:  Active   Description of Group:  Patients in this group were introduced to the idea of adding a variety of healthy supports to address the various needs in their lives. The picture on the front of Sunday's workbook was used to demonstrate why more supports are needed in every patient's life.  Patients identified and described healthy supports versus unhealthy supports in general, then gave examples of each in their own lives.   They discussed what additional healthy supports could be helpful in their recovery and wellness after discharge in order to prevent future hospitalizations.   An emphasis was placed on using counselor, doctor, therapy groups, 12-step groups, and problem-specific support groups to expand supports.  They also worked as a group on developing a specific plan for several patients to deal with unhealthy supports through [place], psychoeducation with loved ones, and even termination of relationships.   Therapeutic Goals:   1)  discuss importance of adding supports to stay well once out of the hospital  2)  compare healthy versus unhealthy supports and identify some examples of each  3)  generate ideas and descriptions of healthy supports that can be added  4)  offer mutual support about how to address unhealthy supports  5)  encourage active participation in and adherence to discharge plan    Summary of Patient Progress:  The patient shared that the current healthy supports available in his life are his parents, girlfriend, older sister, younger brother, 2 nieces and nephew, while the current unhealthy supports are himself 80% of the time, aunts/uncles who same him regarding mental illness stigma, and the toxic work environment from which he was just let go.  The patient expressed a willingness to add a  lot of work on himself to help in his recovery journey.   Therapeutic Modalities:   Motivational Interviewing Brief Solution-Focused Therapy  Selmer Dominion, LCSW 10/01/2017, 11:00AM

## 2017-10-01 NOTE — Progress Notes (Addendum)
Douglas Knight is seen by his physician and dc  order is placed. Patient  Is prepared by this writer for his dc and dc instructions are reviewed with him after all belongings in his locker are given to him and he signs release of material .  He completed his daily  assessment and on this he wrote he denied SI today and he rated his depression, hopelessness and anxiety " 4/3/6", respectively. His DC instructions  Were given to him by this writer and then he was given cc of SRA, AVS, SSP and transition record. He reports he will stay in [place] and then on Tuesday, 10/03/2017 he plans to go to 6 wk recovery in patient rehab program in [place].

## 2017-10-04 DIAGNOSIS — Z0000 Encounter for general adult medical examination without abnormal findings: Secondary | ICD-10-CM | POA: Diagnosis not present

## 2017-10-04 DIAGNOSIS — Z0289 Encounter for other administrative examinations: Secondary | ICD-10-CM | POA: Diagnosis not present

## 2017-10-04 DIAGNOSIS — Z136 Encounter for screening for cardiovascular disorders: Secondary | ICD-10-CM | POA: Diagnosis not present

## 2017-10-04 DIAGNOSIS — Z111 Encounter for screening for respiratory tuberculosis: Secondary | ICD-10-CM | POA: Diagnosis not present

## 2017-10-10 DIAGNOSIS — F411 Generalized anxiety disorder: Secondary | ICD-10-CM | POA: Diagnosis not present

## 2017-10-10 DIAGNOSIS — F332 Major depressive disorder, recurrent severe without psychotic features: Secondary | ICD-10-CM | POA: Diagnosis not present

## 2017-10-16 DIAGNOSIS — F332 Major depressive disorder, recurrent severe without psychotic features: Secondary | ICD-10-CM | POA: Diagnosis not present

## 2017-10-16 DIAGNOSIS — F411 Generalized anxiety disorder: Secondary | ICD-10-CM | POA: Diagnosis not present

## 2017-10-23 DIAGNOSIS — F411 Generalized anxiety disorder: Secondary | ICD-10-CM | POA: Diagnosis not present

## 2017-10-23 DIAGNOSIS — F332 Major depressive disorder, recurrent severe without psychotic features: Secondary | ICD-10-CM | POA: Diagnosis not present

## 2017-11-09 ENCOUNTER — Other Ambulatory Visit: Payer: Self-pay

## 2017-11-09 ENCOUNTER — Encounter: Payer: Self-pay | Admitting: Internal Medicine

## 2017-11-09 ENCOUNTER — Ambulatory Visit (INDEPENDENT_AMBULATORY_CARE_PROVIDER_SITE_OTHER): Payer: BLUE CROSS/BLUE SHIELD | Admitting: Internal Medicine

## 2017-11-09 VITALS — BP 122/80 | HR 66 | Temp 98.6°F | Wt 170.0 lb

## 2017-11-09 DIAGNOSIS — J069 Acute upper respiratory infection, unspecified: Secondary | ICD-10-CM | POA: Diagnosis not present

## 2017-11-09 DIAGNOSIS — B9789 Other viral agents as the cause of diseases classified elsewhere: Secondary | ICD-10-CM | POA: Diagnosis not present

## 2017-11-09 MED ORDER — BENZONATATE 200 MG PO CAPS: 200.0000 mg | ORAL_CAPSULE | Freq: Three times a day (TID) | ORAL | 0 refills | Status: DC | PRN

## 2017-11-09 MED ORDER — IPRATROPIUM BROMIDE 0.06 % NA SOLN: 2.0000 | Freq: Two times a day (BID) | NASAL | 0 refills | Status: DC

## 2017-11-09 NOTE — Patient Instructions (Signed)
Thank you for coming.   Your symptoms are consistent with upper respiratory infection likely caused by a virus. You can take tessalon as needed for your cough and nasal spray which will help with your postnasal drip. Please return to clinic if you have fevers or shortness of breath. The cough can take up to 3 weeks to completely resolve.

## 2017-11-09 NOTE — Progress Notes (Signed)
   [facility] Phone: [phone]   Date of Visit: 11/09/2017   HPI:  Cough and Congestion:  - reports of nasal congestion, intermittently productive cough with yellowish sputum for the past 4-5 days - also reports of post-nasal drip - cough is worse at night - no fevers or chills or shortness of breath - his girlfriend is sick with similar symptoms - has tried mucinex which helps bring up mucous. Has also tried dayquil - has increased his fluid intake - no nausea, vomiting, abdominal pain, or diarrhea.  - no watery eyes or red eyes. Some sneezing  ROS: See HPI.  [place]:  PMH: HTN Migraine/Tension HA Allergic Rhinitis IBS Chronic ITP MDD  PHYSICAL EXAM: BP 122/80   Pulse 66   Temp 98.6 F (37 C) (Oral)   Wt 170 lb (77.1 kg)   SpO2 97%   BMI 24.39 kg/m  GEN: NAD HEENT: Atraumatic, normocephalic, neck supple without lymphadenopathy, EOMI, sclera clear. Oropharynx mildly erythematous without tonsillar exudates. Tympanic membranes normal bilaterally. CV: RRR, no murmurs, rubs, or gallops PULM: CTAB, normal effort ABD: Soft, nontender, nondistended, NABS, no organomegaly SKIN: No rash or cyanosis; warm and well-perfused EXTR: No lower extremity edema or calf tenderness PSYCH: Mood and affect euthymic, normal rate and volume of speech NEURO: Awake, alert, no focal deficits grossly, normal speech   ASSESSMENT/PLAN:  1. Viral URI with cough No signs or symptoms of pneumonia. CXR is not indicated currently. Tessalon PRN and atrovent nasal spray given. Return precautions given.   FOLLOW UP: Follow up if symptoms worsen or do not improve  Smiley Houseman, MD PGY [place]

## 2017-11-11 DIAGNOSIS — F39 Unspecified mood [affective] disorder: Secondary | ICD-10-CM | POA: Diagnosis not present

## 2017-12-13 ENCOUNTER — Institutional Professional Consult (permissible substitution): Payer: BLUE CROSS/BLUE SHIELD | Admitting: Internal Medicine

## 2018-03-06 DIAGNOSIS — L858 Other specified epidermal thickening: Secondary | ICD-10-CM | POA: Diagnosis not present

## 2018-03-06 DIAGNOSIS — L700 Acne vulgaris: Secondary | ICD-10-CM | POA: Diagnosis not present

## 2018-03-06 DIAGNOSIS — L738 Other specified follicular disorders: Secondary | ICD-10-CM | POA: Diagnosis not present

## 2018-06-06 ENCOUNTER — Other Ambulatory Visit: Payer: Self-pay

## 2018-06-06 ENCOUNTER — Ambulatory Visit (INDEPENDENT_AMBULATORY_CARE_PROVIDER_SITE_OTHER): Payer: BLUE CROSS/BLUE SHIELD | Admitting: Family Medicine

## 2018-06-06 ENCOUNTER — Encounter: Payer: Self-pay | Admitting: Family Medicine

## 2018-06-06 VITALS — BP 132/80 | HR 53 | Temp 98.1°F | Ht 68.0 in | Wt 182.0 lb

## 2018-06-06 DIAGNOSIS — M791 Myalgia, unspecified site: Secondary | ICD-10-CM | POA: Insufficient documentation

## 2018-06-06 MED ORDER — CYCLOBENZAPRINE HCL 10 MG PO TABS
10.0000 mg | ORAL_TABLET | Freq: Three times a day (TID) | ORAL | 0 refills | Status: AC | PRN
Start: 2018-06-06 — End: 2018-06-11

## 2018-06-06 NOTE — Progress Notes (Signed)
Subjective:    Patient ID: Douglas Knight, male    DOB: 1985-12-31, 32 y.o.   MRN: [id number]   CC: Right leg pain  HPI: Patient is a 32 year old male with past medical history significant for ITP, IBS, depression, presents today complaining of right leg pain.  Patient reports that symptoms started 4 days ago.  He describes the pain as tightness in his calf traveling behind his knee up to his thigh and buttock.  No symptom on his left side.  Patient does not have a history of back pain.  Patient denies any right knee pain.  Pain is described as sharp in his thigh and buttock.  Patient has tried icing, heating and Tylenol with minimal improvement in symptoms.  Patient has been careful with ibuprofen given his history of thrombocytopenia.  To 3 days prior to having symptoms.  Patient went to the gym and had a workout which included leg presses (120 pounds).  Patient denies any similar symptoms in the past.  Smoking status reviewed   ROS: all other systems were reviewed and are negative other than in the HPI   Past Medical History:  Diagnosis Date  . Anal fissure   . Anxiety   . Depression   . Hypertension   . IBS (irritable bowel syndrome)   . ITP (idiopathic thrombocytopenic purpura)     Past Surgical History:  Procedure Laterality Date  . APPENDECTOMY  2003   [facility]    Past medical history, surgical, family, and social history reviewed and updated in the EMR as appropriate.  Objective:  BP 132/80   Pulse (!) 53   Temp 98.1 F (36.7 C)   Ht 5\' 8"  (1.727 m)   Wt 182 lb (82.6 kg)   SpO2 97%   BMI 27.67 kg/m   Vitals and nursing note reviewed  General: NAD, pleasant, able to participate in exam Cardiac: RRR, normal heart sounds, no murmurs. 2+ radial and PT pulses bilaterally Respiratory: CTAB, normal effort, No wheezes, rales or rhonchi Abdomen: soft, nontender, nondistended, no hepatic or splenomegaly, +BS Right leg: Diffusely tender to  palpation, no swelling, ecchymosis, petechiae noted. Limited range of motion due to pain.  Strength is intact 5/5.  Sensation is intact. Good reflexes, leg is warm and pulses are palpable. Extremities: no edema or cyanosis. WWP. Skin: warm and dry, no rashes noted Neuro: alert and oriented x4, no focal deficits Psych: Normal affect and mood   Assessment & Plan:    Muscle pain Patient presents with right leg pain for the past 4 days.  Appears to be muscular in nature.  No large joint pain. No swelling or acute trauma.  Pain highly suspicious for rhabdomyolysis given significant workout a few days prior to experiencing symptoms.  Tightness and generalized muscle aches could also be signs of myositis.  Less likely to be DVT given lack of shortness of breath, and swelling.  Patient also has a history of thrombocytopenia.  Patient was previously on Seroquel which increase her risk of rhabdomyolysis reports that he has not taken it for the past few months.  Therefore pain is less likely to be iatrogenic.  Discussed with patient likely etiology for generalized leg pain. --Recommended increased hydration for the next few days. --We will order CK, CK-MB BMP and CBC, will follow up on results. --Start patient on Flexeril 10 mg 3 times daily for the next for 5 days as needed. --Patient follow-up if symptoms do not improve   Simon Aaberg  Josalynn Johndrow, [place] PGY-2

## 2018-06-06 NOTE — Patient Instructions (Signed)
Rhabdomyolysis Rhabdomyolysis is a condition that happens when muscle cells break down and release substances into the blood that can damage the kidneys. This condition happens because of damage to the muscles that move bones (skeletal muscle). When the skeletal muscles are damaged, substances inside the muscle cells go into the blood. One of these substances is a protein called myoglobin. Large amounts of myoglobin can cause kidney damage or kidney failure. Other substances that are released by muscle cells may upset the balance of the minerals (electrolytes) in your blood. This imbalance causes your blood to have too much acid (acidosis). What are the causes? This condition is caused by muscle damage. Muscle damage often happens because of:  Using your muscles too much.  An injury that crushes or squeezes a muscle too tightly.  Using illegal drugs, mainly cocaine.  Alcohol abuse.  Other possible causes include:  Prescription medicines, such as those that: ? Lower cholesterol (statins). ? Treat ADHD (attention deficit hyperactivity disorder) or help with weight loss (amphetamines). ? Treat pain (opiates).  Infections.  Muscle diseases that are passed down from parent to child (inherited).  High fever.  Heatstroke.  Not having enough fluids in your body (dehydration).  Seizures.  Surgery.  What increases the risk? This condition is more likely to develop in people who:  Have a family history of muscle disease.  Take part in extreme sports, such as running in marathons.  Have diabetes.  Are older.  Abuse drugs or alcohol.  What are the signs or symptoms? Symptoms of this condition vary. Some people have very few symptoms, and other people have many symptoms. The most common symptoms include:  Muscle pain and swelling.  Weak muscles.  Dark urine.  Feeling weak and tired.  Other symptoms include:  Nausea and vomiting.  Fever.  Pain in the abdomen.  Pain  in the joints.  Symptoms of complications from this condition include:  Heart rhythm that is not normal (arrhythmia).  Seizures.  Not urinating enough because of kidney failure.  Very low blood pressure (shock). Signs of shock include dizziness, blurry vision, and clammy skin.  Bleeding that is hard to stop or control.  How is this diagnosed? This condition is diagnosed based on your medical history, your symptoms, and a physical exam. Tests may also be done, including:  Blood tests.  Urine tests to check for myoglobin.  You may also have other tests to check for causes of muscle damage and to check for complications. How is this treated? Treatment for this condition helps to:  Make sure you have enough fluids in your body.  Lower the acid levels in your blood to reverse acidosis.  Protect your kidneys.  Treatment may include:  Fluids and medicines given through an IV tube that is inserted into one of your veins.  Medicines to lower acidosis or to bring back the balance of the minerals in your body.  Hemodialysis. This treatment uses an artificial kidney machine to filter your blood while you recover. You may have this if other treatments are not helping.  Follow these instructions at home:  Take over-the-counter and prescription medicines only as told by your health care provider.  Rest at home until your health care provider says that you can return to your normal activities.  Drink enough fluid to keep your urine clear or pale yellow.  Do not do activities that take a lot of effort (are strenuous). Ask your health care provider what level of exercise is safe   for you.  Do not abuse drugs or alcohol. If you are having problems with drug or alcohol use, ask your health care provider for help.  Keep all follow-up visits as told by your health care provider. This is important. Contact a health care provider if:  You start having symptoms of this condition after  treatment. Get help right away if:  You have a seizure.  You bleed easily or cannot control bleeding.  You cannot urinate.  You have chest pain.  You have trouble breathing. This information is not intended to replace advice given to you by your health care provider. Make sure you discuss any questions you have with your health care provider. Document Released: 10/27/2004 Document Revised: 08/26/2016 Document Reviewed: 08/26/2016 Elsevier Interactive Patient Education  2018 Elsevier Inc.  

## 2018-06-06 NOTE — Assessment & Plan Note (Addendum)
Patient presents with right leg pain for the past 4 days.  Appears to be muscular in nature.  No large joint pain. No swelling or acute trauma.  Pain highly suspicious for rhabdomyolysis given significant workout a few days prior to experiencing symptoms.  Tightness and generalized muscle aches could also be signs of myositis.  Less likely to be DVT given lack of shortness of breath, and swelling.  Patient also has a history of thrombocytopenia.  Patient was previously on Seroquel which increase her risk of rhabdomyolysis reports that he has not taken it for the past few months.  Therefore pain is less likely to be iatrogenic.  Discussed with patient likely etiology for generalized leg pain. --Recommended increased hydration for the next few days. --We will order CK, CK-MB BMP and CBC, will follow up on results. --Start patient on Flexeril 10 mg 3 times daily for the next for 5 days as needed. --Patient follow-up if symptoms do not improve

## 2018-06-07 ENCOUNTER — Telehealth: Payer: Self-pay | Admitting: *Deleted

## 2018-06-07 LAB — CBC WITH DIFFERENTIAL/PLATELET
BASOS: 1 %
Basophils Absolute: 0 10*3/uL (ref 0.0–0.2)
EOS (ABSOLUTE): 0.2 10*3/uL (ref 0.0–0.4)
EOS: 3 %
HEMATOCRIT: 43.2 % (ref 37.5–51.0)
Hemoglobin: 14.5 g/dL (ref 13.0–17.7)
IMMATURE GRANS (ABS): 0 10*3/uL (ref 0.0–0.1)
IMMATURE GRANULOCYTES: 1 %
LYMPHS: 26 %
Lymphocytes Absolute: 1.4 10*3/uL (ref 0.7–3.1)
MCH: 32.2 pg (ref 26.6–33.0)
MCHC: 33.6 g/dL (ref 31.5–35.7)
MCV: 96 fL (ref 79–97)
MONOS ABS: 0.3 10*3/uL (ref 0.1–0.9)
Monocytes: 6 %
NEUTROS ABS: 3.5 10*3/uL (ref 1.4–7.0)
NEUTROS PCT: 63 %
Platelets: 123 10*3/uL — ABNORMAL LOW (ref 150–450)
RBC: 4.5 x10E6/uL (ref 4.14–5.80)
RDW: 13.7 % (ref 12.3–15.4)
WBC: 5.4 10*3/uL (ref 3.4–10.8)

## 2018-06-07 LAB — BASIC METABOLIC PANEL
BUN/Creatinine Ratio: 12 (ref 9–20)
BUN: 16 mg/dL (ref 6–20)
CALCIUM: 9.7 mg/dL (ref 8.7–10.2)
CO2: 23 mmol/L (ref 20–29)
CREATININE: 1.37 mg/dL — AB (ref 0.76–1.27)
Chloride: 105 mmol/L (ref 96–106)
GFR, EST AFRICAN AMERICAN: 78 mL/min/{1.73_m2} (ref >59)
GFR, EST NON AFRICAN AMERICAN: 67 mL/min/{1.73_m2} (ref >59)
Glucose: 88 mg/dL (ref 65–99)
Potassium: 4.9 mmol/L (ref 3.5–5.2)
Sodium: 145 mmol/L — ABNORMAL HIGH (ref 134–144)

## 2018-06-07 LAB — CK TOTAL AND CKMB (NOT AT ARMC)
CK-MB Index: <1 ng/mL (ref 0.0–10.4)
Total CK: 76 U/L (ref 24–204)

## 2018-06-07 NOTE — Telephone Encounter (Signed)
Spoke with patient regarding normal results per Dr. Andy Gauss.  Patient states that his pain is the same as yesterday and he was advised to call our office on Monday if he has not improved.  Jazmin Hartsell,CMA

## 2018-08-06 ENCOUNTER — Other Ambulatory Visit: Payer: Self-pay

## 2018-08-06 ENCOUNTER — Ambulatory Visit (INDEPENDENT_AMBULATORY_CARE_PROVIDER_SITE_OTHER): Payer: BLUE CROSS/BLUE SHIELD | Admitting: Student in an Organized Health Care Education/Training Program

## 2018-08-06 VITALS — BP 112/74 | HR 76 | Temp 98.3°F | Ht 68.0 in | Wt 186.0 lb

## 2018-08-06 DIAGNOSIS — R0981 Nasal congestion: Secondary | ICD-10-CM

## 2018-08-06 MED ORDER — FLUTICASONE PROPIONATE 50 MCG/ACT NA SUSP: 2.0000 | Freq: Every day | NASAL | 6 refills | Status: DC

## 2018-08-06 NOTE — Patient Instructions (Signed)
It was a pleasure seeing you today in our clinic.   Our clinic's number is [phone]. Please call with questions or concerns about what we discussed today.  Be well, Dr. Erianna Jolly   

## 2018-08-06 NOTE — Progress Notes (Signed)
   CC: URI/sinus symptoms  HPI: Douglas Knight is a 32 y.o. male with PMH significant for IBAS, HTN, MDD, chronic ITP who presents to [facility] today with sinus and cold-like symptoms of 4 days duration.   URI Major symptoms: Scratchy throat, runny nose, congestion.  Has been sick for 4 days. Felt that he was starting to get better yesterday but symptoms persisted this morning.  No fevers. 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 Medications tried: Dayquil, Tylenol, Benadryl  Symptoms Fever: None Headache or face pain: +sinus pressure Sneezing: yes Scratchy throat: yes Allergies: Does have seasonal allergies Muscle aches: no Severe fatigue: mild fatigue, but not severe Stiff neck: no Shortness of breath: no Rash: no Sore throat or swollen glands: no Wheezing: no  No N/V/D/C No urinary symptoms  ROS see HPI Smoking Status noted  Review of Symptoms:  See HPI for ROS.   CC, SH/smoking status, and VS noted.  Objective: BP 112/74   Pulse 76   Temp 98.3 F (36.8 C) (Oral)   Ht 5\' 8"  (1.727 m)   Wt 186 lb (84.4 kg)   SpO2 96%   BMI 28.28 kg/m  GEN: NAD, alert, cooperative, and pleasant. EYE: no conjunctival injection, pupils equally round and reactive to light ENMT: +bilateral tympanic membrane effusion with neutral position of tympanic membrane, no nasal polyps,+rhinorrhea, +mild pharyngeal erythema but no exudate, +ttp over maxillary and frontal sinuses NECK: full ROM, no palpable cervical lymph nodes RESPIRATORY: clear to auscultation bilaterally with no wheezes, rhonchi or rales, good effort CV: RRR, no m/r/g GI: soft, non-tender, non-distended SKIN: warm and dry, no rashes or lesions NEURO: II-XII grossly intact PSYCH: AAOx3  Assessment and plan:  URI - History and physical most consistent with viral upper respiratory infection. Continue conservative management with OTC remedies.  - Push  fluids, also recommended humidified air.  - Recommend mucinex to help loosen secretions.  - Prescribed flonase to help with sinus drainage. - Work note provided.  - Return precautions discussed.  Everrett Coombe, MD,MS,  PGY3 08/06/2018 11:08 AM

## 2018-09-06 ENCOUNTER — Encounter: Payer: Self-pay | Admitting: Allergy

## 2018-09-06 ENCOUNTER — Ambulatory Visit (INDEPENDENT_AMBULATORY_CARE_PROVIDER_SITE_OTHER): Payer: BLUE CROSS/BLUE SHIELD | Admitting: Allergy

## 2018-09-06 VITALS — BP 122/88 | HR 74 | Temp 97.7°F | Resp 16 | Ht 69.25 in | Wt 187.4 lb

## 2018-09-06 DIAGNOSIS — J301 Allergic rhinitis due to pollen: Secondary | ICD-10-CM

## 2018-09-06 DIAGNOSIS — J3081 Allergic rhinitis due to animal (cat) (dog) hair and dander: Secondary | ICD-10-CM

## 2018-09-06 MED ORDER — AZELASTINE-FLUTICASONE 137-50 MCG/ACT NA SUSP
NASAL | 5 refills | Status: DC
Start: 2018-09-06 — End: 2020-06-24

## 2018-09-06 NOTE — Patient Instructions (Addendum)
Allergies   - skin testing today is positive to grasses, weeds, trees, molds, dust mites, cat, dog and cockroach.  Avoidance measures discussed/handouts provided   - try use of xyzal 5mg  daily as needed.  This can replace Allegra if you find it to be more effective   - trial dymista 1 spray each nostril twice a day for nasal congestion and drainage.  This is a combination nasal spray with Flonase + Astelin (nasal antihistamine).  This helps with both nasal congestion and drainage.  If Dymista is not covered then will prescribe Astelin separately for control of runny nose   - allergen immunotherapy discussed today including protocol, benefits and risk.  Informational handout provided.  If interested in this therapuetic option you can check with your insurance carrier for coverage.  Let us know if you would like to proceed with this option.    Follow-up 6-9 months or sooner if needed

## 2018-09-06 NOTE — Progress Notes (Signed)
New Patient Note  RE: Douglas Knight MRN: [id number] DOB: Feb 07, 1986 Date of Office Visit: 09/06/2018  Referring provider: Alveda Reasons, MD Primary care provider: Alveda Reasons, MD  Chief Complaint: Possible cat allergy  History of present illness: Douglas Knight is a 32 y.o. male presenting today for consultation for allergic rhinitis.  He feels he may be allergic to cats.  He reports sneezing, runny nose and roof of mouth gets itchy with exposure to cats which he has 3 in the home.  He has been using benadryl on most days of the week which does help.  He states these symptoms have been ongoing for 6-12 months and was worse during spring.    He states he has always had symptoms during pollen season.  He takes Human resources officer during pollen season.  Zyrtec makes him sleepy.  He has tried Claritin and did not find it to be effective.  He has never tried Xyzal.  He has used flonase and nasal Atrovent during a sinus infection.    No history of asthma, eczema or food allergy.    Review of systems: Review of Systems  Constitutional: Negative for chills, fever and malaise/fatigue.  HENT: Positive for congestion. Negative for ear discharge, ear pain, nosebleeds, sinus pain and sore throat.   Eyes: Negative for pain, discharge and redness.  Respiratory: Negative for cough, shortness of breath and wheezing.   Cardiovascular: Negative for chest pain.  Gastrointestinal: Negative for abdominal pain, constipation, diarrhea, heartburn, nausea and vomiting.  Musculoskeletal: Negative for joint pain.  Skin: Negative for itching and rash.  Neurological: Negative for headaches.    All other systems negative unless noted above in HPI  Past medical history: Past Medical History:  Diagnosis Date  . Anal fissure   . Anxiety   . Depression   . Hypertension   . IBS (irritable bowel syndrome)   . ITP (idiopathic thrombocytopenic purpura)   . Urticaria     Past surgical history: Past  Surgical History:  Procedure Laterality Date  . APPENDECTOMY  2003   [facility]    Family history:  Family History  Problem Relation Age of Onset  . Diabetes Father   . Hypertension Father   . Colon polyps Father   . Colon polyps Mother   . Heart disease Maternal Grandfather   . Alzheimer's disease Paternal Grandmother   . Depression Paternal Grandfather   . Colon polyps Paternal Grandfather   . Colon cancer Paternal Aunt   . Asthma Neg Hx   . Allergic rhinitis Neg Hx   . Angioedema Neg Hx   . Eczema Neg Hx   . Urticaria Neg Hx     Social history: He lives in a home with carpeting in the bedroom with gas heating and central cooling.  There are 3 cats in the home.  Neighbors have dogs.  There is no concern for water damage, mildew or roaches in the home.  He is a Environmental consultant.  He denies a smoking history.  Medication List: Allergies as of 09/06/2018      Reactions   Sulfa Antibiotics Anaphylaxis, Hives, Other (See Comments)   Stomach pain   Wellbutrin [bupropion] Shortness Of Breath      Medication List        Accurate as of 09/06/18 11:47 AM. Always use your most recent med list.          fluticasone 50 MCG/ACT nasal spray Commonly known as:  Ephraim  2 sprays into both nostrils daily.   ipratropium 0.06 % nasal spray Commonly known as:  ATROVENT Place 2 sprays into both nostrils 2 (two) times daily. Can go up to 4 times a day   lamoTRIgine 150 MG tablet Commonly known as:  LAMICTAL Take 150 mg by mouth 2 (two) times daily.   propranolol 40 MG tablet Commonly known as:  INDERAL Take 1 tablet (40 mg total) daily by mouth.       Known medication allergies: Allergies  Allergen Reactions  . Sulfa Antibiotics Anaphylaxis, Hives and Other (See Comments)    Stomach pain  . Wellbutrin [Bupropion] Shortness Of Breath     Physical examination: Blood pressure 122/88, pulse 74, temperature 97.7 F (36.5 C), temperature  source Oral, resp. rate 16, height 5' 9.25" (1.759 m), weight 187 lb 6.4 oz (85 kg), SpO2 97 %.  General: Alert, interactive, in no acute distress. HEENT: PERRLA, TMs pearly gray, turbinates moderately edematous with clear discharge, post-pharynx non erythematous. Neck: Supple without lymphadenopathy. Lungs: Clear to auscultation without wheezing, rhonchi or rales. {no increased work of breathing. CV: Normal S1, S2 without murmurs. Abdomen: Nondistended, nontender. Skin: Warm and dry, without lesions or rashes. Extremities:  No clubbing, cyanosis or edema. Neuro:   Grossly intact.  Diagnositics/Labs:  Allergy testing: Environmental allergy skin prick testing is positive to grasses, weeds, trees, dust mites, cat Intradermal testing is positive to mold mix 1, mold mix 2, dog and cockroach Allergy testing results were read and interpreted by provider, documented by clinical staff.   Assessment and plan:   Allergic rhinitis   - skin testing today is positive to grasses, weeds, trees, molds, dust mites, cat, dog and cockroach.  Avoidance measures discussed/handouts provided   - try use of xyzal 5mg  daily as needed.  This can replace Allegra if you find it to be more effective   - trial dymista 1 spray each nostril twice a day for nasal congestion and drainage.  This is a combination nasal spray with Flonase + Astelin (nasal antihistamine).  This helps with both nasal congestion and drainage.  If Dymista is not covered then will prescribe Astelin separately for control of runny nose   - allergen immunotherapy discussed today including protocol, benefits and risk.  Informational handout provided.  If interested in this therapuetic option you can check with your insurance carrier for coverage.  Let us know if you would like to proceed with this option.    Follow-up 6-9 months or sooner if needed  I appreciate the opportunity to take part in Yadiel's care. Please do not hesitate to contact me  with questions.  Sincerely,   Prudy Feeler, MD Allergy/Immunology [facility]

## 2018-09-20 ENCOUNTER — Telehealth: Payer: Self-pay | Admitting: Allergy

## 2018-09-20 NOTE — Telephone Encounter (Signed)
Patient has checked with insurance and would like to begin immunotherapy.  I told him I would send you a message and someone would call him to set him up an appointment to begin from [place] when they the RX are ready.  Thank you Dr. Nelva Bush.  Douglas Knight

## 2018-09-20 NOTE — Telephone Encounter (Signed)
Noted  

## 2018-09-21 NOTE — Addendum Note (Signed)
Addended by: Theresia Lo on: 09/21/2018 04:37 PM   Modules accepted: Orders

## 2018-09-21 NOTE — Telephone Encounter (Signed)
ITX done.    Please call pt to set up start injection appt.

## 2018-09-24 DIAGNOSIS — J301 Allergic rhinitis due to pollen: Secondary | ICD-10-CM | POA: Diagnosis not present

## 2018-09-24 NOTE — Telephone Encounter (Signed)
Left message for pt to call and schedule an appt for starting injections.

## 2018-09-25 ENCOUNTER — Ambulatory Visit: Payer: BLUE CROSS/BLUE SHIELD | Admitting: Family Medicine

## 2018-09-25 DIAGNOSIS — J3089 Other allergic rhinitis: Secondary | ICD-10-CM | POA: Diagnosis not present

## 2018-09-27 NOTE — Progress Notes (Signed)
VIALS EXP 09-28-19

## 2018-10-09 ENCOUNTER — Ambulatory Visit (INDEPENDENT_AMBULATORY_CARE_PROVIDER_SITE_OTHER): Payer: BLUE CROSS/BLUE SHIELD | Admitting: *Deleted

## 2018-10-09 DIAGNOSIS — J309 Allergic rhinitis, unspecified: Secondary | ICD-10-CM | POA: Diagnosis not present

## 2018-10-09 MED ORDER — EPINEPHRINE 0.3 MG/0.3ML IJ SOAJ: INTRAMUSCULAR | 2 refills | Status: DC

## 2018-10-09 NOTE — Progress Notes (Signed)
AuviQ sent in to [facility] per Dr Neldon Mc and allergy injection protocol.

## 2018-10-09 NOTE — Progress Notes (Signed)
Immunotherapy   Patient Details  Name: Kevonte Vanecek MRN: [id number] Date of Birth: 1986-01-12  10/09/2018  Kipp Laurence started injections for  Dmite-Mold-Ragweed & Pollen-Pet. Following schedule: B  Frequency:2 times per week Epi-Pen:Epi-Pen Available  Consent signed and patient instructions given. After about 20 minutes the patient came into the injection room stating that he had a headache, felt nauseas, tingling on his face and his fingers and overall not feeling well. I took patient to an exam room and Dr. Neldon Mc came in to see the patient and Epinephrine was given and then 50 mg of Benadryl. Patient was observed for another hour and was discharged home once he was stable. Per Dr. Neldon Mc patient needs to hold allergy injections until his blood pressure medication has been switched from a beta blocker.    Horris Latino 10/09/2018, 4:15 PM

## 2018-10-10 ENCOUNTER — Telehealth: Payer: Self-pay | Admitting: *Deleted

## 2018-10-10 NOTE — Telephone Encounter (Signed)
Noted  

## 2018-10-10 NOTE — Telephone Encounter (Signed)
Thanks.  I have reached out to Dr. Mingo Amber to discuss changing his medication.

## 2018-10-10 NOTE — Telephone Encounter (Signed)
Left message for patient to call office to see how he is doing after yesterday reaction.

## 2018-10-10 NOTE — Telephone Encounter (Signed)
Patient called back states he is feeling fine but was a bit tired this morning. States his pcp Dr Mingo Amber at [place] handles her bp meds. Advised bp medication would have to be changed prior to resuming injections patient verbalized understanding.

## 2018-10-10 NOTE — Telephone Encounter (Signed)
Per Dr. Bary Leriche ask who is providing his blood pressure medication. We will need to discuss with the provider about possibly changing medications before proceeding with injections.

## 2018-10-11 NOTE — Progress Notes (Signed)
I have reached out to his PCP to discuss changing to non-bb option for his BP control.  Will hold further immunotherapy dose until this change has been able to be made.

## 2018-10-16 ENCOUNTER — Telehealth: Payer: Self-pay | Admitting: *Deleted

## 2018-10-16 NOTE — Telephone Encounter (Signed)
Received fax from [facility] that they have been unable to reach patient for Auvi-Q.  If patient calls back please ask about this matter, or continue to follow up if we do not hear back.

## 2018-10-16 NOTE — Telephone Encounter (Signed)
-----   Message from [place], MD sent at 10/16/2018  8:30 AM EST ----- Regarding: FW: allergen immunotherapy Can someone call pt and see who is prescribing the inderal?   Let pt know we have touched base with PCP who as below has not been refilling this medication.   We will need to talk with whoever if filling this medication to determine if there is an alternative to be able to resume allergy shots.   ----- Message ----- From: Alveda Reasons, MD Sent: 10/15/2018   3:37 PM EST To: Kennith Gain, MD Subject: RE: allergen immunotherapy                     Dr. Nelva Bush,  Thank you for the message.  Mr. Spittler Inderal is mostly for anxiety.  However, as far as I can tell, I have not filled this medication for him since the start of 2019.  Neither has anyone else in our clinic.  If he is still on this medicine, he is receiving it from another clinic.    Annabell Sabal MD   ----- Message ----- From: Kennith Gain, MD Sent: 10/10/2018   2:37 PM EST To: Alveda Reasons, MD Subject: allergen immunotherapy                         Dr. Mingo Amber,      I am seeing a pt of yours in my allergy clinic and we decide to start allergen immunotherapy.  He came to start and had minor reaction.  He is on inderal which may pose a risk for failure of treatment if epinephrine is needed.  It is recommended if he would like to continue on immunotherapy that his inderal be changed to a non-beta blocker BP medication.  Are there any other appropriate BP medications that he can be changed too?   We will hold on restarting immunotherapy until he has been changed off of the inderal and will also do a slower build-up for him.    Let me know if you have any questions regarding this.     Thank you, Prudy Feeler, MD

## 2018-10-16 NOTE — Telephone Encounter (Signed)
Left message for patient to call the office

## 2018-10-19 NOTE — Telephone Encounter (Signed)
LM for patient to return call.

## 2018-10-22 NOTE — Telephone Encounter (Signed)
Left message for patient to call back. I am sending a letter to patient to request a call back to discuss.

## 2018-10-23 NOTE — Telephone Encounter (Addendum)
Spoke with patient and he states that his psychiatrist has prescribed the Inderal at times as well as Dr. Mingo Amber. Informed patient he needs to follow up with Dr. Mingo Amber for him to be able to switch his medication so we can be able to restart injections.

## 2018-10-24 NOTE — Telephone Encounter (Signed)
Ok thank [employer].  Per Dr. Mingo Amber the inderal is moreso for anxiety.  Did you mean his psychiatrist?  If he has a psychiatrist who his prescribing it as well he should discuss with that doctor as well.   Do we know if he takes this medication daily?  If he no longer needs this medication he can discuss with Dr. Mingo Amber or the other doctor about stopping or finding an alternative for anxiety.

## 2018-11-22 DIAGNOSIS — J020 Streptococcal pharyngitis: Secondary | ICD-10-CM | POA: Diagnosis not present

## 2018-11-25 DIAGNOSIS — J020 Streptococcal pharyngitis: Secondary | ICD-10-CM | POA: Diagnosis not present

## 2018-12-02 DIAGNOSIS — R52 Pain, unspecified: Secondary | ICD-10-CM | POA: Diagnosis not present

## 2018-12-02 DIAGNOSIS — J029 Acute pharyngitis, unspecified: Secondary | ICD-10-CM | POA: Diagnosis not present

## 2018-12-05 DIAGNOSIS — J029 Acute pharyngitis, unspecified: Secondary | ICD-10-CM | POA: Diagnosis not present

## 2018-12-05 DIAGNOSIS — J060 Acute laryngopharyngitis: Secondary | ICD-10-CM | POA: Diagnosis not present

## 2018-12-05 DIAGNOSIS — J040 Acute laryngitis: Secondary | ICD-10-CM | POA: Diagnosis not present

## 2018-12-10 DIAGNOSIS — F4325 Adjustment disorder with mixed disturbance of emotions and conduct: Secondary | ICD-10-CM | POA: Diagnosis not present

## 2019-02-08 ENCOUNTER — Ambulatory Visit (INDEPENDENT_AMBULATORY_CARE_PROVIDER_SITE_OTHER): Payer: BLUE CROSS/BLUE SHIELD | Admitting: Family Medicine

## 2019-02-08 ENCOUNTER — Other Ambulatory Visit: Payer: Self-pay

## 2019-02-08 VITALS — BP 110/80 | HR 73 | Temp 98.7°F | Wt 186.0 lb

## 2019-02-08 DIAGNOSIS — R6889 Other general symptoms and signs: Secondary | ICD-10-CM | POA: Diagnosis not present

## 2019-02-08 LAB — POCT INFLUENZA A/B
Influenza A, POC: NEGATIVE
Influenza B, POC: NEGATIVE

## 2019-02-08 NOTE — Assessment & Plan Note (Addendum)
Patient presents with sore throat, fatigue and cough for the past two days. Patient denies any fevers or shortness of breath. He has had no recent travel out of state or out of the country. Patient likely has sick contact because of his occupation. 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 Patient has tested negative for the flu. He was afebrile here today with remaining vitals within normal limits. Given recent outbreak of COVID-19 and patient symptoms of fatigue and cough he was given a symptoms self monitoring log. He will continue to monitor his symptoms over the next 3-4 days for fever and shortness of breath, if he does not improve or worsens he will contact the health department to discuss need for testing vs continued self quarantine until further notice. He was also given the option to call the clinic for an update. We will be following.

## 2019-02-08 NOTE — Progress Notes (Signed)
   Subjective:    Patient ID: Douglas Knight, male    DOB: 1986/09/16, 32 y.o.   MRN: [id number]   CC: Sore throat, fatigue, cough  HPI: Patient is a 33 year old male who presents today complaining of sore throat, fatigue, and cough.  Patient reports that symptoms started on Wednesday sore throat.  Since then he has had significant fatigue and cough associated with sore throat.  Patient denies any fevers or shortness of breath but reports that he broke out in a sweat last night.  Patient took Tylenol this morning because he felt feverish although he did not measure his temperature.  Patient reports that he has been traveling around in different counties because of a job and hospice service has been in [place], [facility].  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  Smoking status reviewed   ROS: all other systems were reviewed and are negative other than in the HPI   Past Medical History:  Diagnosis Date  . Anal fissure   . Anxiety   . Depression   . Hypertension   . IBS (irritable bowel syndrome)   . ITP (idiopathic thrombocytopenic purpura)   . Urticaria     Past Surgical History:  Procedure Laterality Date  . APPENDECTOMY  2003   [facility]    Past medical history, surgical, family, and social history reviewed and updated in the EMR as appropriate.  Objective:  BP 110/80   Pulse 73   Temp 98.7 F (37.1 C) (Oral)   Wt 186 lb (84.4 kg)   SpO2 97%   BMI 27.27 kg/m   Vitals and nursing note reviewed  General: NAD, pleasant, able to participate in exam Cardiac: RRR, normal heart sounds, no murmurs. 2+ radial and PT pulses bilaterally Respiratory: CTAB, normal effort, No wheezes, rales or rhonchi Abdomen: soft, nontender, nondistended, no hepatic or splenomegaly, +BS Extremities: no edema or cyanosis. WWP. Skin: warm and dry, no rashes noted Neuro:  alert and oriented x4, no focal deficits Psych: Normal affect and mood   Assessment & Plan:   Flu-like symptoms Patient presents with sore throat, fatigue and cough for the past two days. Patient denies any fevers or shortness of breath. He has had no recent travel out of state or out of the country. Patient likely has sick contact because of his occupation. 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 Patient has tested negative for the flu. He was afebrile here today with remaining vitals within normal limits. Given recent outbreak of COVID-19 and patient symptoms of fatigue and cough he was given a symptoms self monitoring log. He will continue to monitor his symptoms over the next 3-4 days for fever and shortness of breath, if he does not improve or worsens he will contact the health department to discuss need for testing vs continued self quarantine until further notice. He was also given the option to call the clinic for an update. We will be following.    Marjie Skiff, MD [place] PGY-3

## 2019-02-11 ENCOUNTER — Telehealth: Payer: Self-pay | Admitting: Student in an Organized Health Care Education/Training Program

## 2019-02-11 ENCOUNTER — Ambulatory Visit: Payer: Self-pay

## 2019-02-11 DIAGNOSIS — R6889 Other general symptoms and signs: Secondary | ICD-10-CM

## 2019-02-11 NOTE — Telephone Encounter (Signed)
Was informed by Dr. Andy Gauss that this patient is at risk of COVID-19. Dr. 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 Patient was previously advised to stay home.  However he has scheduled an appointment to be seen in our office this morning.    Called and spoke with the patient who endorses having sore throat, cough, feels achy.  He has had low-grade fevers to 99.11F for which he has been taking Tylenol.  He remains well-hydrated.  He is not having dyspnea or wheezing.  Advised patient that he should stay home.  He should self-quarantine.  We will put in an order for novel coronavirus.  Will ask [facility] nursing to call the patient with details on drive-up testing, which patient can complete tomorrow.  Patient endorses understanding and agreement with this plan.  Patient was advised that if he develops wheezing, shortness of breath or feels like this is an emergency, it is advisable that he calls ahead prior to going to the emergency department or calls our office so that we can advise him on how to proceed.  He expresses understanding.

## 2019-02-11 NOTE — Telephone Encounter (Signed)
Pt informed of times and location of drive thru.   Pt lives with his fiance and wants to know is she should get tested. She does not have a sore throat or cough but was running a fever yesterday.  She does not have a PCP so I advised her to go to the "testing tents" located outside the EDs starting tomorrow. Christen Bame, CMA

## 2019-02-12 ENCOUNTER — Telehealth: Payer: Self-pay | Admitting: *Deleted

## 2019-02-12 ENCOUNTER — Telehealth (HOSPITAL_COMMUNITY): Payer: Self-pay | Admitting: *Deleted

## 2019-02-12 DIAGNOSIS — R6889 Other general symptoms and signs: Secondary | ICD-10-CM

## 2019-02-18 LAB — NOVEL CORONAVIRUS, NAA: SARS-CoV-2, NAA: NOT DETECTED

## 2019-02-19 ENCOUNTER — Telehealth: Payer: Self-pay

## 2019-02-19 NOTE — Telephone Encounter (Signed)
LVM for pt to call me back. Corona test negative. Diallo asked me to call him to inform.

## 2019-04-11 ENCOUNTER — Encounter: Payer: Self-pay | Admitting: Family Medicine

## 2019-04-12 MED ORDER — PROPRANOLOL HCL 40 MG PO TABS: 40.0000 mg | ORAL_TABLET | Freq: Every day | ORAL | 1 refills | Status: DC

## 2019-04-12 MED ORDER — PROPRANOLOL HCL 40 MG PO TABS: 40.0000 mg | ORAL_TABLET | Freq: Every day | ORAL | 1 refills | Status: AC

## 2019-04-12 NOTE — Addendum Note (Signed)
Addended byMingo Amber, Arletha Marschke H on: 04/12/2019 04:00 PM   Modules accepted: Orders

## 2019-07-05 DIAGNOSIS — F319 Bipolar disorder, unspecified: Secondary | ICD-10-CM | POA: Diagnosis not present

## 2019-08-09 ENCOUNTER — Telehealth (INDEPENDENT_AMBULATORY_CARE_PROVIDER_SITE_OTHER): Payer: BC Managed Care – PPO | Admitting: Family Medicine

## 2019-08-09 ENCOUNTER — Other Ambulatory Visit: Payer: Self-pay

## 2019-08-09 DIAGNOSIS — R0981 Nasal congestion: Secondary | ICD-10-CM | POA: Diagnosis not present

## 2019-08-09 MED ORDER — FLUTICASONE PROPIONATE 50 MCG/ACT NA SUSP: 2.0000 | Freq: Every day | NASAL | 0 refills | Status: DC

## 2019-08-09 NOTE — Progress Notes (Signed)
[facility] Telemedicine Visit  Patient consented to have virtual visit. Method of visit: Telephone  Encounter participants: Patient: Douglas Knight - located at home Provider: Wilber Oliphant - located at [facility] Others (if applicable): none  Chief Complaint: Sinus issues   HPI: Patient experiencing sneezing, congestion, itchy eyes, sinus pressure x 2 days. Happens every time the seasons changes. Keeps losing his voice. Producing some mucous when coughing, not discolored and no SOB. No fevers. Using Dayquil, but was not helpful. Takes xyzal every single night and uses flonase.  ROS: per HPI  Pertinent PMHx: No history of sinus issues. Does have hx of allergic rhinitis   Exam:  Respiratory: sounds congested over the phone but without SOB.   Assessment/Plan:  Congestion of nasal sinus Given history of allergic rhinitis and similar symptoms with season changes, patient's symptoms are consistent with allergies and possible sinusitis. Patient without fevers, sore throat, SOB. Unlikely to be COVID, but will offer COVID testing.  - supportive care - if continues x 8 more days, instructed to call the office for possible evaluation of bacterial sinusitis requring abx.    Time spent during visit with patient: 9 minutes  Wilber Oliphant, M.D.  PGY-2  Family Medicine  [phone] 08/11/2019 4:49 PM

## 2019-08-11 ENCOUNTER — Encounter: Payer: Self-pay | Admitting: Family Medicine

## 2019-08-11 DIAGNOSIS — R0981 Nasal congestion: Secondary | ICD-10-CM | POA: Insufficient documentation

## 2019-08-11 NOTE — Assessment & Plan Note (Signed)
Given history of allergic rhinitis and similar symptoms with season changes, patient's symptoms are consistent with allergies and possible sinusitis. Patient without fevers, sore throat, SOB. Unlikely to be COVID, but will offer COVID testing.  - supportive care - if continues x 8 more days, instructed to call the office for possible evaluation of bacterial sinusitis requring abx.

## 2019-10-01 ENCOUNTER — Encounter: Payer: Self-pay | Admitting: *Deleted

## 2019-10-01 ENCOUNTER — Ambulatory Visit: Payer: BC Managed Care – PPO | Admitting: Gastroenterology

## 2019-12-12 ENCOUNTER — Ambulatory Visit: Payer: BC Managed Care – PPO

## 2019-12-12 DIAGNOSIS — R5383 Other fatigue: Secondary | ICD-10-CM | POA: Diagnosis not present

## 2019-12-12 DIAGNOSIS — Z9189 Other specified personal risk factors, not elsewhere classified: Secondary | ICD-10-CM | POA: Diagnosis not present

## 2019-12-12 DIAGNOSIS — Z20822 Contact with and (suspected) exposure to covid-19: Secondary | ICD-10-CM | POA: Diagnosis not present

## 2019-12-12 DIAGNOSIS — R52 Pain, unspecified: Secondary | ICD-10-CM | POA: Diagnosis not present

## 2019-12-12 DIAGNOSIS — R112 Nausea with vomiting, unspecified: Secondary | ICD-10-CM | POA: Diagnosis not present

## 2019-12-12 DIAGNOSIS — R109 Unspecified abdominal pain: Secondary | ICD-10-CM | POA: Diagnosis not present

## 2019-12-15 DIAGNOSIS — R0781 Pleurodynia: Secondary | ICD-10-CM | POA: Diagnosis not present

## 2019-12-15 DIAGNOSIS — R141 Gas pain: Secondary | ICD-10-CM | POA: Diagnosis not present

## 2019-12-15 DIAGNOSIS — K5900 Constipation, unspecified: Secondary | ICD-10-CM | POA: Diagnosis not present

## 2019-12-15 DIAGNOSIS — R1084 Generalized abdominal pain: Secondary | ICD-10-CM | POA: Diagnosis not present

## 2019-12-28 ENCOUNTER — Telehealth: Payer: BC Managed Care – PPO

## 2019-12-28 ENCOUNTER — Encounter (HOSPITAL_COMMUNITY): Payer: Self-pay

## 2019-12-28 ENCOUNTER — Other Ambulatory Visit: Payer: Self-pay

## 2019-12-28 ENCOUNTER — Ambulatory Visit (HOSPITAL_COMMUNITY)
Admission: EM | Admit: 2019-12-28 | Discharge: 2019-12-28 | Disposition: A | Discharge status: Home / Self Care | Payer: BC Managed Care – PPO | Attending: Family Medicine | Admitting: Family Medicine

## 2019-12-28 DIAGNOSIS — R05 Cough: Secondary | ICD-10-CM

## 2019-12-28 DIAGNOSIS — U071 COVID-19: Secondary | ICD-10-CM | POA: Diagnosis not present

## 2019-12-28 DIAGNOSIS — R0789 Other chest pain: Secondary | ICD-10-CM | POA: Diagnosis not present

## 2019-12-28 DIAGNOSIS — I10 Essential (primary) hypertension: Secondary | ICD-10-CM | POA: Diagnosis not present

## 2019-12-28 DIAGNOSIS — R059 Cough, unspecified: Secondary | ICD-10-CM

## 2019-12-28 MED ORDER — BENZONATATE 100 MG PO CAPS: 100.0000 mg | ORAL_CAPSULE | Freq: Three times a day (TID) | ORAL | 0 refills | Status: DC | PRN

## 2019-12-28 NOTE — Discharge Instructions (Signed)
Continue with mucinex d and over the counter medications as needed.  Push fluids to ensure adequate hydration and keep secretions thin.  Tylenol, 1000mg  every 8 hours to help with pain, splinting of the chest wall to help with pain.  Tessalon every 8 hours as needed for cough. May not take cough away completely but hopefully will help some.  You may try some vitamins to help your immune system potentially:  Vitamin C 500mg  twice a day. Zinc 50mg  daily. Vitamin D 5000IU daily.   Please return to be seen if develop shortness of breath, difficulty breathing, chest pain, persistent fevers, weakness, or otherwise worsening.

## 2019-12-28 NOTE — ED Provider Notes (Signed)
[facility]    CSN: [id number] Arrival date & time: 12/28/19  1300      History   Chief Complaint Chief Complaint  Patient presents with  . Cough    COVID +    HPI Douglas Knight is a 34 y.o. male.   Douglas Knight presents with complaints of persistent cough, which is causing muscular soreness to anterior chest and back. Tested 1/22 for covid-19 at his work, spiked a temp that night of 100.2. 1/23 felt worse and found out on 1/23 he was positive for covid-19. Has been in isolation since. Intermittent fevers. Sore throat. Cough. Some nausea. Vomited once. Cough is dry. Took mucinex which didn't necessarily help. No shortness of breath or difficulty breathing. Feels sore to upper abdomen, chest wall and back from coughing. No pain with deep breathing. Cough causes the pain. Cough is not worsening, but not no improvement. Cough can keep him up at night. Nasal drainage and congestion. Has tried taking mucinex DM, nyquil, sudafed. Still occasional fever, worse at night, last night 100.3. TMAX of 100.8 this entire illness. Tylenol does help with fever. 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 History  Of ITP.      ROS per HPI, negative if not otherwise mentioned.      Past Medical History:  Diagnosis Date  . Anal fissure   . Anxiety   . Depression   . Hypertension   . IBS (irritable bowel syndrome)   . ITP (idiopathic thrombocytopenic purpura)   . Urticaria     Patient Active Problem List   Diagnosis Date Noted  . Congestion of nasal sinus 08/11/2019  . Flu-like symptoms 02/08/2019  . Muscle pain 06/06/2018  . MDD (major depressive disorder), recurrent episode, severe ([place]) 09/25/2017  . Acute tension headache 05/15/2017  . Generalized anxiety disorder 05/15/2017  . Colon polyp 12/07/2016  . Irritable bowel syndrome 09/02/2016  . External hemorrhoid 09/02/2016  . Allergic rhinitis 04/29/2016  . Migraine 04/29/2016  . Vitamin D deficiency 11/17/2015  .  Insomnia 07/27/2015  . Acne 03/09/2015  . Essential hypertension 02/27/2015  . Elevated serum creatinine 12/31/2014  . Fatigue 12/29/2014  . Medication management 12/29/2014  . Major depressive disorder, recurrent episode, moderate ([place]) 12/29/2014  . Chronic ITP (idiopathic thrombocytopenia) ([facility]) 12/29/2014    Past Surgical History:  Procedure Laterality Date  . APPENDECTOMY  2003   [facility] Medications    Prior to Admission medications   Medication Sig Start Date End Date Taking? Authorizing Provider  lamoTRIgine (LAMICTAL) 150 MG tablet Take 150 mg by mouth 2 (two) times daily. 08/09/18  Yes [provider]  propranolol (INDERAL) 40 MG tablet Take 1 tablet (40 mg total) by mouth daily. 04/12/19  Yes Alveda Reasons, MD  Azelastine-Fluticasone [facility]) 137-50 MCG/ACT SUSP Use 1 spray per nostril 1-2 times daily for nasal congestion or drainage 09/06/18   Kennith Gain, MD  benzonatate (TESSALON) 100 MG capsule Take 1-2 capsules (100-200 mg total) by mouth 3 (three) times daily as needed for cough. 12/28/19   Augusto Gamble B, NP  EPINEPHrine (AUVI-Q) 0.3 mg/0.3 mL IJ SOAJ injection Use as directed for severe allergic reaction 10/09/18   Kozlow, Donnamarie Poag, MD  fluticasone (FLONASE) 50 MCG/ACT nasal spray Place 2 sprays into both nostrils daily. 08/09/19   Wilber Oliphant, MD  ipratropium (ATROVENT) 0.06 % nasal spray Place 2 sprays into both nostrils 2 (two) times daily. Can  go up to 4 times a day Patient not taking: Reported on 09/06/2018 11/09/17   Smiley Houseman, MD    Family History Family History  Problem Relation Age of Onset  . Diabetes Father   . Hypertension Father   . Colon polyps Father   . Colon polyps Mother   . Heart disease Maternal Grandfather   . Alzheimer's disease Paternal Grandmother   . Depression Paternal Grandfather   . Colon polyps Paternal Grandfather   . Colon cancer Paternal Aunt   . Asthma  Neg Hx   . Allergic rhinitis Neg Hx   . Angioedema Neg Hx   . Eczema Neg Hx   . Urticaria Neg Hx     Social History Social History   Tobacco Use  . Smoking status: Never Smoker  . Smokeless tobacco: Never Used  Substance Use Topics  . Alcohol use: Yes    Alcohol/week: 0.0 standard drinks    Comment: rarely  . Drug use: No     Allergies   Sulfa antibiotics and Wellbutrin [bupropion]   Review of Systems Review of Systems   Physical Exam Triage Vital Signs ED Triage Vitals  Enc Vitals Group     BP 12/28/19 1337 (!) 144/96     Pulse Rate 12/28/19 1337 86     Resp 12/28/19 1337 16     Temp 12/28/19 1337 98.1 F (36.7 C)     Temp Source 12/28/19 1337 Oral     SpO2 12/28/19 1337 97 %     Weight --      Height --      Head Circumference --      Peak Flow --      Pain Score 12/28/19 1335 4     Pain Loc --      Pain Edu? --      Excl. in [place]? --    No data found.  Updated Vital Signs BP (!) 144/96 (BP Location: Left Arm)   Pulse 86   Temp 98.1 F (36.7 C) (Oral)   Resp 16   SpO2 97%    Physical Exam Constitutional:      Appearance: He is well-developed.  HENT:     Mouth/Throat:     Comments: Hoarse voice noted  Cardiovascular:     Rate and Rhythm: Normal rate.  Pulmonary:     Effort: Pulmonary effort is normal.     Breath sounds: Normal breath sounds.  Chest:     Chest wall: Tenderness present.     Comments: Distal aspect of anterior chest wall tenderness on palpation Skin:    General: Skin is warm and dry.  Neurological:     Mental Status: He is alert and oriented to person, place, and time.      UC Treatments / Results  Labs (all labs ordered are listed, but only abnormal results are displayed) Labs Reviewed - No data to display  EKG   Radiology No results found.  Procedures Procedures (including critical care time)  Medications Ordered in UC Medications - No data to display  Initial Impression / Assessment and Plan / UC Course   I have reviewed the triage vital signs and the nursing notes.  Pertinent labs & imaging results that were available during my care of the patient were reviewed by me and considered in my medical decision making (see chart for details).     No tachycardia, no tachypnea, no hypoxia. No shortness of breath . No pain with breathing. Pain  to musculature is reproducible on palpation and with cough. Known covid-19, day 8 of illness. Not worsening but minimal improvement. Imaging deferred at this time. History  Of ITP, nsaids deferred. Regular use of tylenol, tessalon provided. Continue with over the counter  Medications. Return precautions provided. Patient verbalized understanding and agreeable to plan.   Final Clinical Impressions(s) / UC Diagnoses   Final diagnoses:  [id number] virus infection  Cough  Chest wall pain     Discharge Instructions     Continue with mucinex d and over the counter medications as needed.  Push fluids to ensure adequate hydration and keep secretions thin.  Tylenol, 1000mg  every 8 hours to help with pain, splinting of the chest wall to help with pain.  Tessalon every 8 hours as needed for cough. May not take cough away completely but hopefully will help some.  You may try some vitamins to help your immune system potentially:  Vitamin C 500mg  twice a day. Zinc 50mg  daily. Vitamin D 5000IU daily.   Please return to be seen if develop shortness of breath, difficulty breathing, chest pain, persistent fevers, weakness, or otherwise worsening.     ED Prescriptions    Medication Sig Dispense Auth. Provider   benzonatate (TESSALON) 100 MG capsule Take 1-2 capsules (100-200 mg total) by mouth 3 (three) times daily as needed for cough. 21 capsule Zigmund Gottron, NP     PDMP not reviewed this encounter.   Zigmund Gottron, NP 12/28/19 [phone]

## 2019-12-28 NOTE — ED Triage Notes (Signed)
Patient presents to Urgent Care with complaints of continued cough and difficulty taking a deep breath since the past few days. Patient reports he was diagnosed w/ covid a week ago.

## 2020-01-04 ENCOUNTER — Encounter (HOSPITAL_COMMUNITY): Payer: Self-pay | Admitting: *Deleted

## 2020-01-04 ENCOUNTER — Other Ambulatory Visit: Payer: Self-pay

## 2020-01-04 ENCOUNTER — Ambulatory Visit (HOSPITAL_COMMUNITY)
Admission: EM | Admit: 2020-01-04 | Discharge: 2020-01-04 | Disposition: A | Discharge status: Home / Self Care | Payer: BC Managed Care – PPO | Attending: Emergency Medicine | Admitting: Emergency Medicine

## 2020-01-04 DIAGNOSIS — I951 Orthostatic hypotension: Secondary | ICD-10-CM | POA: Diagnosis not present

## 2020-01-04 DIAGNOSIS — U071 COVID-19: Secondary | ICD-10-CM | POA: Insufficient documentation

## 2020-01-04 DIAGNOSIS — H9311 Tinnitus, right ear: Secondary | ICD-10-CM | POA: Insufficient documentation

## 2020-01-04 DIAGNOSIS — R42 Dizziness and giddiness: Secondary | ICD-10-CM | POA: Diagnosis not present

## 2020-01-04 HISTORY — DX: COVID-19: U07.1

## 2020-01-04 HISTORY — DX: Bipolar disorder, unspecified: F31.9

## 2020-01-04 LAB — CBC
HCT: 43.6 % (ref 39.0–52.0)
Hemoglobin: 15.7 g/dL (ref 13.0–17.0)
MCH: 32 pg (ref 26.0–34.0)
MCHC: 36 g/dL (ref 30.0–36.0)
MCV: 89 fL (ref 80.0–100.0)
Platelets: 290 10*3/uL (ref 150–400)
RBC: 4.9 MIL/uL (ref 4.22–5.81)
RDW: 11.2 % — ABNORMAL LOW (ref 11.5–15.5)
WBC: 4.2 10*3/uL (ref 4.0–10.5)
nRBC: 0 % (ref 0.0–0.2)

## 2020-01-04 LAB — BASIC METABOLIC PANEL
Anion gap: 12 (ref 5–15)
BUN: 11 mg/dL (ref 6–20)
CO2: 23 mmol/L (ref 22–32)
Calcium: 9.7 mg/dL (ref 8.9–10.3)
Chloride: 106 mmol/L (ref 98–111)
Creatinine, Ser: 1.35 mg/dL — ABNORMAL HIGH (ref 0.61–1.24)
GFR calc Af Amer: >60 mL/min (ref >60)
GFR calc non Af Amer: >60 mL/min (ref >60)
Glucose, Bld: 113 mg/dL — ABNORMAL HIGH (ref 70–99)
Potassium: 4 mmol/L (ref 3.5–5.1)
Sodium: 141 mmol/L (ref 135–145)

## 2020-01-04 MED ORDER — MECLIZINE HCL 25 MG PO TABS: 25.0000 mg | ORAL_TABLET | Freq: Three times a day (TID) | ORAL | 0 refills | Status: AC | PRN

## 2020-01-04 NOTE — ED Triage Notes (Addendum)
C/O ongoing sore throat and hoarse voice (since dx Covid 12/20/2019).  Denies any fevers over past 2 days.  C/O occasionaly dizziness onset 3 days ago; started with right ear tinnitus @ 0100 this AM.  States blood pressure has been fluctuating over past couple days.  States drinking PO fluids well.  Pt states he had first Covid vaccine on 12/21/2019.

## 2020-01-04 NOTE — Discharge Instructions (Addendum)
Your creatinine is slightly elevated, but it has been in this range in the past.  I suspect that you are somewhat dehydrated as you were orthostatic when we checked your vital signs.  You need to be pushing at least 2 L of fluid every day preferably electrolyte containing fluids such as Pedialyte.  Please get your creatinine rechecked in a week or 2.  You can use the meclizine if it helps your dizziness.  Follow-up with [facility] if the ringing in the ear persist.  Go to the ER for the signs and symptoms we discussed, specifically if you have strokelike symptoms.

## 2020-01-04 NOTE — ED Provider Notes (Signed)
HPI  SUBJECTIVE:  Douglas Knight is a 34 y.o. male who presents with intermitent, discrete episodes of dizziness, lightheadedness that lasts for an unknown amount of time starting 3 days ago.  He reports some nausea with it.  No vomiting, chest pain, shortness of breath, headache, palpitations.  No syncope, seizures.  States that he is not eating as much as he normally does but is pushing fluids.  No change in his medications, ear pain, hearing loss.  No diarrhea in a week.  He has tried meclizine with improvement in his symptoms.  He has also tried some Phenergan.  No aggravating factors.  The dizziness is not associated with large positional changes, turning his head, bending forward or exertion.    He also reports having intermittently blurry vision but does not necessarily occur with dizziness, lightheadedness.  Also lasts for an unknown amount of time.  Describes it as an inability to focus on near objects.  He is able to focus on far objects while he is having the blurry vision.  He denies double vision, visual loss, headache, arm or leg weakness, slurred speech, facial droop, discoordination, photophobia.  States that he checked his blood pressure while having blurry vision and it read 174/100, rechecked it again it went down to 148/94.  States he was normotensive this morning.  No new medications.  He has never had symptoms like this before.  He also reports right ear tinnitus last night starting at 1 AM.  States it is constant.  Denies hearing changes, ear pain, fullness, trauma, exposure to loud noise, foreign body insertion.  No recent antibiotics.  No aggravating or alleviating factors.  He has not tried anything for this.  He has a past medical history of vertigo, hypertension, ITP, bipolar.  No history of diabetes, atrial fibrillation, arrhythmia, MI, stroke, aneurysm.  States that he was diagnosed with Covid 2 weeks ago. PMD: [facility]   Past Medical History:  Diagnosis  Date  . Anal fissure   . Anxiety   . Bipolar disorder ([place])   . COVID-19   . Depression   . Hypertension   . IBS (irritable bowel syndrome)   . ITP (idiopathic thrombocytopenic purpura)   . Urticaria     Past Surgical History:  Procedure Laterality Date  . APPENDECTOMY  2003   [facility]    Family History  Problem Relation Age of Onset  . Diabetes Father   . Hypertension Father   . Colon polyps Father   . Colon polyps Mother   . Heart disease Maternal Grandfather   . Alzheimer's disease Paternal Grandmother   . Depression Paternal Grandfather   . Colon polyps Paternal Grandfather   . Colon cancer Paternal Aunt   . Asthma Neg Hx   . Allergic rhinitis Neg Hx   . Angioedema Neg Hx   . Eczema Neg Hx   . Urticaria Neg Hx     Social History   Tobacco Use  . Smoking status: Never Smoker  . Smokeless tobacco: Never Used  Substance Use Topics  . Alcohol use: Yes    Comment: rarely  . Drug use: No    No current facility-administered medications for this encounter.  Current Outpatient Medications:  .  lamoTRIgine (LAMICTAL) 150 MG tablet, Take 150 mg by mouth 2 (two) times daily., Disp: , Rfl: 5 .  propranolol (INDERAL) 40 MG tablet, Take 1 tablet (40 mg total) by mouth daily., Disp: 90 tablet, Rfl: 1 .  Azelastine-Fluticasone (DYMISTA) 137-50 MCG/ACT SUSP, Use 1 spray per nostril 1-2 times daily for nasal congestion or drainage, Disp: 1 Bottle, Rfl: 5 .  EPINEPHrine (AUVI-Q) 0.3 mg/0.3 mL IJ SOAJ injection, Use as directed for severe allergic reaction, Disp: 2 Device, Rfl: 2 .  fluticasone (FLONASE) 50 MCG/ACT nasal spray, Place 2 sprays into both nostrils daily., Disp: 16 g, Rfl: 0 .  meclizine (ANTIVERT) 25 MG tablet, Take 1 tablet (25 mg total) by mouth 3 (three) times daily as needed for dizziness., Disp: 30 tablet, Rfl: 0  Allergies  Allergen Reactions  . Sulfa Antibiotics Anaphylaxis, Hives and Other (See Comments)    Stomach pain  .  Wellbutrin [Bupropion] Shortness Of Breath     ROS  As noted in HPI.   Physical Exam  BP (!) 150/98   Pulse 84   Temp 98.9 F (37.2 C) (Oral)   Resp 18   SpO2 100%    Orthostatic VS for the past 24 hrs:  BP- Lying Pulse- Lying BP- Sitting Pulse- Sitting BP- Standing at 0 minutes Pulse- Standing at 0 minutes  01/04/20 1414 (!) 150/98 61 (!) 150/100 78 (!) 139/102 85    Constitutional: Well developed, well nourished, no acute distress Eyes:  EOMI, conjunctiva normal bilaterally PERRLA, no photophobia.  All visual fields intact to confrontation HENT: Normocephalic, atraumatic,mucus membranes moist. TMs normal and intact bilaterally.  Hearing grossly intact and equal bilaterally. Respiratory: Normal inspiratory effort lungs clear bilaterally Cardiovascular: Normal rate regular rhythm no murmurs rubs or gallops.  No carotid bruit. GI: nondistended skin: No rash, skin intact Musculoskeletal: no deformities Neurologic: Alert & oriented x 3, cranial nerves III through XII intact, coordination normal.  Gait steady.  Speech fluent. + Dix-Hallpike right side. Psychiatric: Speech and behavior appropriate   ED Course   Medications - No data to display  Orders Placed This Encounter  Procedures  . CBC    Standing Status:   Standing    Number of Occurrences:   1  . Basic metabolic panel    Standing Status:   Standing    Number of Occurrences:   1  . Orthostatic vital signs    Standing Status:   Standing    Number of Occurrences:   1    Results for orders placed or performed during the hospital encounter of 01/04/20 (from the past 24 hour(s))  CBC     Status: Abnormal   Collection Time: 01/04/20  2:00 PM  Result Value Ref Range   WBC 4.2 4.0 - 10.5 K/uL   RBC 4.90 4.22 - 5.81 MIL/uL   Hemoglobin 15.7 13.0 - 17.0 g/dL   HCT 43.6 39.0 - 52.0 %   MCV 89.0 80.0 - 100.0 fL   MCH 32.0 26.0 - 34.0 pg   MCHC 36.0 30.0 - 36.0 g/dL   RDW 11.2 (L) 11.5 - 15.5 %   Platelets 290 150  - 400 K/uL   nRBC 0.0 0.0 - 0.2 %  Basic metabolic panel     Status: Abnormal   Collection Time: 01/04/20  2:00 PM  Result Value Ref Range   Sodium 141 135 - 145 mmol/L   Potassium 4.0 3.5 - 5.1 mmol/L   Chloride 106 98 - 111 mmol/L   CO2 23 22 - 32 mmol/L   Glucose, Bld 113 (H) 70 - 99 mg/dL   BUN 11 6 - 20 mg/dL   Creatinine, Ser 1.35 (H) 0.61 - 1.24 mg/dL   Calcium 9.7 8.9 - 10.3  mg/dL   GFR calc non Af Amer >60 >60 mL/min   GFR calc Af Amer >60 >60 mL/min   Anion gap 12 5 - 15   No results found.  ED Clinical Impression  1. Orthostasis   2. Dizziness   3. COVID-19 virus infection   4. Tinnitus of right ear      ED Assessment/Plan  We will check orthostatics, CBC and BMP.  He is completely neurologically intact.  He does have a positive Dix-Hallpike on the right side and also states that his symptoms improved with meclizine which is reassuring for peripheral vertigo.  Giving him strict ER return precautions.  Continue pushing fluids.    Blurry vision: His visual fields are intact.  States that it is just near vision.  Pt has no blurry vision here but he states that he does feel somewhat dizzy.  Stroke in the differential especially with the recent Covid, but given that the blurry vision comes and goes, think that this is less likely.  Could be related to his blood pressure, but he denies other symptoms of hypertensive emergency.  Tinnitus.  Does not appear to be an ear infection.  TMs are intact bilaterally.  No hearing loss.  Could be early Mnire's or residual side effects from Covid.  We will have him follow-up with [facility] regarding this if it persists.  Patient is orthostatic.  His creatinine is also slightly elevated, but has been in this range over the past 2 years.  Suspect the dizziness is coming from some dehydration.  His platelets and electrolytes are within normal limits.  We will have him continue pushing fluids, advised him to start eating more  regularly, but will send home with meclizine because of a positive Dix-Hallpike on the right side and the fact that the meclizine has helped him in the past.  Plan as above.  Discussed labs,  MDM, treatment plan, and plan for follow-up with patient. Discussed sn/sx that should prompt return to the ED. patient agrees with plan.   Meds ordered this encounter  Medications  . meclizine (ANTIVERT) 25 MG tablet    Sig: Take 1 tablet (25 mg total) by mouth 3 (three) times daily as needed for dizziness.    Dispense:  30 tablet    Refill:  0    *This clinic note was created using [employer]. Therefore, there may be occasional mistakes despite careful proofreading.   ?   Melynda Ripple, MD 01/05/20 [phone]

## 2020-01-13 ENCOUNTER — Telehealth (INDEPENDENT_AMBULATORY_CARE_PROVIDER_SITE_OTHER): Payer: BC Managed Care – PPO | Admitting: Family Medicine

## 2020-01-13 ENCOUNTER — Other Ambulatory Visit: Payer: Self-pay

## 2020-01-13 ENCOUNTER — Telehealth: Payer: Self-pay | Admitting: *Deleted

## 2020-01-13 DIAGNOSIS — H9311 Tinnitus, right ear: Secondary | ICD-10-CM | POA: Diagnosis not present

## 2020-01-13 DIAGNOSIS — Z8616 Personal history of COVID-19: Secondary | ICD-10-CM | POA: Diagnosis not present

## 2020-01-13 NOTE — Telephone Encounter (Signed)
LVM to call office to go over pre-check prior to [place] visit.  Told pt if we do not hear from him will try to connect through [place]. [name] Zimmerman Rumple, CMA

## 2020-01-21 DIAGNOSIS — H832X9 Labyrinthine dysfunction, unspecified ear: Secondary | ICD-10-CM | POA: Diagnosis not present

## 2020-01-21 DIAGNOSIS — H903 Sensorineural hearing loss, bilateral: Secondary | ICD-10-CM | POA: Diagnosis not present

## 2020-01-21 DIAGNOSIS — H9311 Tinnitus, right ear: Secondary | ICD-10-CM | POA: Diagnosis not present

## 2020-01-22 DIAGNOSIS — Z113 Encounter for screening for infections with a predominantly sexual mode of transmission: Secondary | ICD-10-CM | POA: Diagnosis not present

## 2020-01-22 DIAGNOSIS — Z0000 Encounter for general adult medical examination without abnormal findings: Secondary | ICD-10-CM | POA: Diagnosis not present

## 2020-01-22 DIAGNOSIS — Z8616 Personal history of COVID-19: Secondary | ICD-10-CM | POA: Diagnosis not present

## 2020-01-30 ENCOUNTER — Ambulatory Visit (INDEPENDENT_AMBULATORY_CARE_PROVIDER_SITE_OTHER): Payer: BC Managed Care – PPO | Admitting: Otolaryngology

## 2020-01-31 DIAGNOSIS — H9311 Tinnitus, right ear: Secondary | ICD-10-CM | POA: Diagnosis not present

## 2020-01-31 NOTE — Progress Notes (Signed)
Appointment Cancelled

## 2020-02-03 ENCOUNTER — Other Ambulatory Visit: Payer: Self-pay

## 2020-02-03 ENCOUNTER — Ambulatory Visit: Payer: BC Managed Care – PPO | Attending: Pediatrics | Admitting: Physical Therapy

## 2020-02-03 ENCOUNTER — Encounter: Payer: Self-pay | Admitting: Physical Therapy

## 2020-02-03 DIAGNOSIS — M9901 Segmental and somatic dysfunction of cervical region: Secondary | ICD-10-CM | POA: Diagnosis not present

## 2020-02-03 DIAGNOSIS — R42 Dizziness and giddiness: Secondary | ICD-10-CM | POA: Diagnosis not present

## 2020-02-03 DIAGNOSIS — R2681 Unsteadiness on feet: Secondary | ICD-10-CM | POA: Diagnosis not present

## 2020-02-03 DIAGNOSIS — M9907 Segmental and somatic dysfunction of upper extremity: Secondary | ICD-10-CM | POA: Diagnosis not present

## 2020-02-03 DIAGNOSIS — M9902 Segmental and somatic dysfunction of thoracic region: Secondary | ICD-10-CM | POA: Diagnosis not present

## 2020-02-03 DIAGNOSIS — G44229 Chronic tension-type headache, not intractable: Secondary | ICD-10-CM | POA: Diagnosis not present

## 2020-02-03 NOTE — Patient Instructions (Signed)
Gaze Stabilization: Tip Card  1.Target must remain in focus, not blurry, and appear stationary while head is in motion. 2.Perform exercises with small head movements (45 to either side of midline). 3.Increase speed of head motion so long as target is in focus. 4.If you wear eyeglasses, be sure you can see target through lens (therapist will give specific instructions for bifocal / progressive lenses). 5.These exercises may provoke dizziness or nausea. Work through these symptoms. If too dizzy, slow head movement slightly. Rest between each exercise. 6.Exercises demand concentration; avoid distractions. 7.For safety, perform standing exercises close to a counter, wall, corner, or next to someone.     Gaze Stabilization: Standing Feet Apart    Feet shoulder width apart, keeping eyes on target on wall _6___ feet away, tilt head down 15-30 and move head side to side for _60___ seconds. Repeat while moving head up and down for _60___ seconds. Do _3-5___ sessions per day. Repeat using target on pattern background.    Also DO:   1) SIT TO STAND - STEP FORWARD RIGHT AND TURN LEFT                                 STEP FORWARD LEFT AND TURN RIGHT 5-10 TIMES     2)  INCORPORATE HEAD TURNS SIDE TO SIDE DURING WALKING

## 2020-02-04 NOTE — Therapy (Addendum)
[facility] [address] [place], [place], [address] Phone: [phone]   Fax:  [phone]  Physical Therapy Evaluation  Patient Details  Name: Douglas Knight MRN: [id number] Date of Birth: 05/05/1986 Referring Provider (PT): Anselm Jungling, PA-C   Encounter Date: 02/03/2020  PT End of Session - 02/04/20 1011    Visit Number  1    Number of Visits  10    Date for PT Re-Evaluation  03/20/20    Authorization Type  [employer]    Authorization - Number of Visits  60    PT Start Time  0850    PT Stop Time  0931    PT Time Calculation (min)  41 min    Activity Tolerance  Patient tolerated treatment well    Behavior During Therapy  [facility] for tasks assessed/performed       Past Medical History:  Diagnosis Date  . Anal fissure   . Anxiety   . Bipolar disorder ([place])   . COVID-19   . Depression   . Hypertension   . IBS (irritable bowel syndrome)   . ITP (idiopathic thrombocytopenic purpura)   . Urticaria     Past Surgical History:  Procedure Laterality Date  . APPENDECTOMY  2003   [facility]    There were no vitals filed for this visit.   Subjective Assessment - 02/03/20 0854    Subjective  Pt reports he tested positive for Covid on Jan. 24, 2021; states dizziness3/10 intensity at time of eval. P reports dizziness and tinnitus started post Covid on Jan.24 - states he was having a bad coughing spell and the dizziness and the tinnitus started together.   Pt states he took Meclizine yesterday at 4:00 (1x only) - states he is trying not to take it.  Pt reports riding in the car provokes dizziness and nause - pt denies any vomiting but does report mod. to severe nausea.  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  Pt reports he saw Dr. Erik Obey on Friday, [id number] and he has ordered a CT scan and referred him to neurologist.     Pertinent History  chronic ITP:  IBS:  h/o major depressive disorder; bipolar disorder; Covid; HTN    Patient Stated Goals  regain balance, resolve the dizziness, and regain stamina    Currently in Pain?  Other (Comment)   dull headache        [facility] PT Assessment - 02/04/20 0001      Assessment   Medical Diagnosis  Vestibular Dysfunction: Deficit of VOR:  Vertigo:  Tinnitus    Referring Provider (PT)  Anselm Jungling, PA-C    Onset Date/Surgical Date  12/22/19    Prior Therapy  none      Precautions   Precautions  Other (comment)   vertigo     Restrictions   Weight Bearing Restrictions  No      Balance Screen   Has the patient fallen in the past 6 months  Yes    How many times?  1    Has the patient had a decrease in activity level because of a fear of falling?   Yes    Is the patient reluctant to leave their home because of a fear of falling?   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  Pt reports he was having a bad cough spell due to Covid on 12-22-19 and during the coughing he suddenly had Rt ear fullness and pressure with onset of tinnitus and dizziness.  Pt reports riding  in car provokes the dizziness, unable to drive due to the dizziness and is unable to quickly scroll on his phone or quickly scan TV channels with remote.        Symptom Behavior   Type of Dizziness   Imbalance;Unsteady with head/body turns;Lightheadedness;"Funny feeling in head";Comment   occasional blurred vision   Frequency of Dizziness  every day    Duration of Dizziness  constant    Symptom Nature  Constant;Motion provoked    Relieving Factors  Medication;Rest;Lying supine;Comments    Progression of Symptoms  No change since onset    History of similar episodes  none      Oculomotor Exam   Oculomotor Alignment  Normal    Spontaneous  Absent    Smooth Pursuits  Intact   provoked vertigo   Saccades  Intact   provoked vertigo     Oculomotor Exam-Fixation Suppressed    Ocular Alignment  Normal    Spontaneous Nystagmus  none    Left Head Impulse  positive    Right Head Impulse  positive       Visual Acuity   Static  line 10     Dynamic  line 9   pt reported incr. dizziness upon completion of test     Positional Sensitivities   Pivot Right in Standing  Moderate dizziness    Pivot Left in Standing  Moderate dizziness    Positional Sensitivities Comments  increased dizziness reported with ambulation with horizontal head turns; less dizziness reported with vertical head turns during gait           Objective measurements completed on examination: See above findings.              PT Education - 02/04/20 1006    Education Details  eval results; pt instructed in x1 viewing, walking with head turns as tolerated, and sit to stand with pivot turns Rt & Lt sides for habituation    Person(s) Educated  Patient    Methods  Explanation;Demonstration;Handout    Comprehension  Verbalized understanding;Returned demonstration       PT Short Term Goals - 02/04/20 1133      PT SHORT TERM GOAL #1   Title  Pt will improve DHI score from 50% to </ 30% to demonstrate improvement  in vertigo.    Time  3    Period  Weeks    Status  New    Target Date  02/28/20      PT SHORT TERM GOAL #2   Title  Independent in HEP for vestibular exercises.    Time  3    Period  Weeks    Status  New    Target Date  02/28/20      PT SHORT TERM GOAL #3   Title  Perform SOT and establish goal as appropriate.    Time  3    Period  Weeks    Status  New    Target Date  02/28/20        PT Long Term Goals - 02/04/20 1135      PT LONG TERM GOAL #1   Title  Improve DHI score from 50 to </= 20% to demo improvement in vertigo.    Baseline  50% on 02-03-20    Time  6    Period  Weeks    Status  New    Target Date  03/20/20      PT LONG TERM GOAL #2   Title  Pt will subjectively report ability to perform visual scanning on cell phone and use of tablet with minimal (</= 2/10) dizziness for abiltiy to return to work activities.    Time  6    Period  Weeks    Status  New    Target Date  03/20/20      PT LONG TERM GOAL #3   Title  Pt will amb. 40' with horizontal head turns with minimal c/o dizziness (</= 2/10) for ease with environmental scanning.    Time  6    Period  Weeks    Status  New    Target Date  03/20/20      PT LONG TERM GOAL #4   Title  Increase vestibular input score on SOT to WNL's for improved vestibular function in maintaining balance.    Time  6    Period  Weeks    Status  New    Target Date  03/20/20                02/04/20 1117  Plan  Clinical Impression Statement Pt is a 34 yr old male with c/o dizziness and Rt ear tinnitus post Covid on 12-22-19; pt states he was forcefully coughing and suddenly developed the Rt ear tinnitus and dizziness.  He describes the dizziness as some light-headedness and is triggered by head movements and any activity requiring visual scanning or focus, such as using cell phone, scanning TV channels or working on his tablet .  Pt presents with normal DVA test with a 1 line difference, however,  pt did report increased  dizziness provoked by the testing.  Pt appears to have vestibular hypofunction with some unsteadiness occurring with amb. with head turns with incr. c/o dizziness, impaired VOR, and motion sensitivity.  Personal Factors and Comorbidities Comorbidity 2;Profession;Behavior Pattern  Examination-Activity Limitations [place];Other;Stairs;Transfers;Locomotion Level (visual scanning with head turns)  Examination-Participation Restrictions Community Activity;Driving;Shop;Cleaning;Other (work)  Pt will benefit from skilled therapeutic intervention in order to improve on the following deficits Difficulty walking;Dizziness;Decreased activity tolerance;Decreased balance;Decreased mobility;Decreased range of motion;Impaired perceived functional ability;Pain  Stability/Clinical Decision Making Evolving/Moderate complexity  Clinical Decision Making Moderate  Rehab Potential Good  PT Frequency 2x / week (2x/week for 3 weeks followed by 1x/wk for 3 wks)  PT Duration 6 weeks  PT Treatment/Interventions ADLs/Self Care Home Management;Therapeutic exercise;Vestibular;Neuromuscular re-education;Patient/family education;Balance training;Therapeutic activities;Gait training;Cryotherapy;Moist Heat;Manual techniques;Passive range of motion;Dry needling  PT Next Visit Plan do SOT; check exercises given on 02-03-20 for HEP; add exercises as appropriate based on SOT results  PT Home Exercise Plan x1 viewing, habituation (step and pivot turn), amb. with head turns  Consulted and Agree with Plan of Care Patient   Addendum by Rico Junker, PT, DPT 02/26/20    4:55 PM  Dry needling, manual therapy, PROM added to treatment interventions to address decreased ROM and pain.   Patient will benefit from skilled therapeutic intervention in order to improve the following deficits and impairments:  Difficulty walking, Dizziness, Decreased activity tolerance, Decreased balance, Decreased mobility, Decreased range of motion, Impaired  perceived functional ability, Pain  Visit Diagnosis: Dizziness and giddiness  Unsteadiness on feet     Problem List Patient Active Problem List   Diagnosis Date Noted  . Congestion of nasal sinus 08/11/2019  . Flu-like symptoms 02/08/2019  . Muscle pain 06/06/2018  . MDD (major depressive disorder), recurrent episode, severe ([place]) 09/25/2017  . Acute tension headache 05/15/2017  . Generalized anxiety disorder 05/15/2017  . Colon polyp 12/07/2016  . Irritable bowel syndrome 09/02/2016  . External hemorrhoid 09/02/2016  . Allergic rhinitis 04/29/2016  . Migraine 04/29/2016  . Vitamin D deficiency 11/17/2015  . Insomnia 07/27/2015  . Acne 03/09/2015  . Essential hypertension 02/27/2015  . Elevated serum creatinine 12/31/2014  . Fatigue 12/29/2014  . Medication management 12/29/2014  . Major depressive disorder, recurrent episode, moderate ([place]) 12/29/2014  . Chronic ITP (idiopathic thrombocytopenia) ([facility]) 12/29/2014    Rawad Bochicchio, [place], PT 02/04/2020, 11:54 AM  Rico Junker, PT, DPT 02/26/20    4:56 PM    [facility] [address] [place], [place], [address] Phone: [phone]   Fax:  [phone]  Name: Douglas Knight MRN: [id number] Date of Birth: 01-Jul-1986   Physician: Florestine Avers, PA-C   Physician Documentation Your signature is required to indicate approval of the treatment plan as stated above.  Please sign and either send electronically or make a copy of this report for your files and return this physician signed original.  Please mark one 1.__approve of plan   2. ___approve of plan with the followingconditions. ____________________________________________________________________________________________________________________________________________   ______________________                                                       _____________________ Physician Signature                                                                      Date    Faxed to MD for signature

## 2020-02-05 ENCOUNTER — Other Ambulatory Visit: Payer: Self-pay | Admitting: Otolaryngology

## 2020-02-05 DIAGNOSIS — H9311 Tinnitus, right ear: Secondary | ICD-10-CM

## 2020-02-06 DIAGNOSIS — M9902 Segmental and somatic dysfunction of thoracic region: Secondary | ICD-10-CM | POA: Diagnosis not present

## 2020-02-06 DIAGNOSIS — M9901 Segmental and somatic dysfunction of cervical region: Secondary | ICD-10-CM | POA: Diagnosis not present

## 2020-02-06 DIAGNOSIS — M9907 Segmental and somatic dysfunction of upper extremity: Secondary | ICD-10-CM | POA: Diagnosis not present

## 2020-02-06 DIAGNOSIS — G44229 Chronic tension-type headache, not intractable: Secondary | ICD-10-CM | POA: Diagnosis not present

## 2020-02-10 ENCOUNTER — Encounter: Payer: Self-pay | Admitting: Physical Therapy

## 2020-02-10 ENCOUNTER — Other Ambulatory Visit: Payer: Self-pay

## 2020-02-10 ENCOUNTER — Ambulatory Visit: Payer: BC Managed Care – PPO | Admitting: Physical Therapy

## 2020-02-10 DIAGNOSIS — M9907 Segmental and somatic dysfunction of upper extremity: Secondary | ICD-10-CM | POA: Diagnosis not present

## 2020-02-10 DIAGNOSIS — G44229 Chronic tension-type headache, not intractable: Secondary | ICD-10-CM | POA: Diagnosis not present

## 2020-02-10 DIAGNOSIS — R2681 Unsteadiness on feet: Secondary | ICD-10-CM | POA: Diagnosis not present

## 2020-02-10 DIAGNOSIS — M9902 Segmental and somatic dysfunction of thoracic region: Secondary | ICD-10-CM | POA: Diagnosis not present

## 2020-02-10 DIAGNOSIS — M9901 Segmental and somatic dysfunction of cervical region: Secondary | ICD-10-CM | POA: Diagnosis not present

## 2020-02-10 DIAGNOSIS — R42 Dizziness and giddiness: Secondary | ICD-10-CM

## 2020-02-10 NOTE — Therapy (Signed)
[facility] [address] [place], [place], [address] Phone: [phone]   Fax:  [phone]  Physical Therapy Treatment  Patient Details  Name: Douglas Knight MRN: [id number] Date of Birth: 26-Jan-1986 Referring Provider (PT): Anselm Jungling, PA-C   Encounter Date: 02/10/2020  PT End of Session - 02/10/20 1647    Visit Number  2    Number of Visits  10    Date for PT Re-Evaluation  03/20/20    Authorization Type  BCBS    Authorization - Visit Number  2    Authorization - Number of Visits  60    PT Start Time  1020    PT Stop Time  1103    PT Time Calculation (min)  43 min    Equipment Utilized During Treatment  Other (comment)   harness vest for SOT on Balance Master   Activity Tolerance  Patient tolerated treatment well    Behavior During Therapy  [facility] for tasks assessed/performed       Past Medical History:  Diagnosis Date  . Anal fissure   . Anxiety   . Bipolar disorder ([place])   . COVID-19   . Depression   . Hypertension   . IBS (irritable bowel syndrome)   . ITP (idiopathic thrombocytopenic purpura)   . Urticaria     Past Surgical History:  Procedure Laterality Date  . APPENDECTOMY  2003   [facility]    There were no vitals filed for this visit.  Subjective Assessment - 02/10/20 1024    Subjective  Pt reports he has headache at this time - rates it 5/10 intensity; says he is not driving - keeps head down while riding in car to minimize the dizziness.  Pt reports doing the x1 viewing exercise at home at least 3 times/day - states he has some nausea in the mornings   pt has appt with Dr. Orie Rout at the [facility] for his migraines   Pertinent History  chronic ITP:  IBS:  h/o major depressive disorder; bipolar disorder; Covid; HTN    Patient Stated Goals  regain balance, resolve the dizziness, and regain stamina    Currently in Pain?  Yes   headache - 5/10 intensity    Pain Score  5     Pain Location  Head    Pain Orientation  --   headache   Pain Descriptors / Indicators  Aching    Pain Type  Other (Comment)   headache   Pain Onset  More than a month ago    Pain Frequency  Intermittent    Aggravating Factors   visual or motion activities    Pain Relieving Factors  rest, medication             Vestibular Assessment - 02/10/20 0001      Balancemaster   Engineer, materials Comment  composite score 31/100 with N= 70/100      Sensory Organization Test: composite score 31/100;  N= 70/100 Somatosensory input WNL's Visual input 13/100 with N= 73/100 Vestibular input 1-2/100 with N=55/100  Condition 1 - trials 1 and 2 WNL's; trial 3 below normal slightly  Condition 2 - trials 1 & 2 WNL's; trial 3 below normal Condition 3 - trials 1 & 2 below Normal; trial 3 WNL's Condition 4 - trial 1 below normal; FALL on trials 2 and 3  Condition 5 - FALL on all 3 trials  Condition 6 - FALL on all 3 trials    Pt instructed in balance on foam for HEP - feet apart and together with EO and EC   Marching in place - EO 10 reps each; EC 10 reps with CGA without head turns                  PT Education - 02/10/20 1646    Education Details  Pt instructed in standing on foam (4 positions) with feet apart/together; EO and EC; marching on pillows - head turns as tolerated    Person(s) Educated  Patient    Methods  Explanation;Demonstration;Handout    Comprehension  Verbalized understanding;Returned demonstration       PT Short Term Goals - 02/10/20 1656      PT SHORT TERM GOAL #1   Title  Pt will improve DHI score from 50% to </ 30% to demonstrate improvement in vertigo.    Time  3    Period  Weeks    Status  New    Target Date  02/28/20      PT SHORT TERM GOAL #2   Title  Independent in HEP for vestibular exercises.    Time  3    Period  Weeks    Status  New    Target Date  02/28/20      PT  SHORT TERM GOAL #3   Title  Perform SOT and establish goal as appropriate.    Time  3    Period  Weeks    Status  New    Target Date  02/28/20        PT Long Term Goals - 02/10/20 1656      PT LONG TERM GOAL #1   Title  Improve DHI score from 50 to </= 20% to demo improvement in vertigo.    Baseline  50% on 02-03-20    Time  6    Period  Weeks    Status  New      PT LONG TERM GOAL #2   Title  Pt will subjectively report ability to perform visual scanning on cell phone and use of tablet with minimal (</= 2/10) dizziness for abiltiy to return to work activities.    Time  6    Period  Weeks    Status  New      PT LONG TERM GOAL #3   Title  Pt will amb. 42' with horizontal head turns with minimal c/o dizziness (</= 2/10) for ease with environmental scanning.    Time  6    Period  Weeks    Status  New      PT LONG TERM GOAL #4   Title  Increase vestibular input score on SOT to WNL's for improved vestibular function in maintaining balance.    Time  6    Period  Weeks    Status  New            Plan - 02/10/20 1650    Clinical Impression Statement  Pt's SOT composite score is 31/100 with N= 70/100; pt's somatosensory input is WNL's, visual input is significantly decreased at 13/100 with N= 73/100 and vestibular is minimal at 1-2/100 with N= 55/100.  Pt reported increased headache after completion of SOT.    Personal Factors and Comorbidities  Comorbidity 2;Profession;Behavior Pattern    Examination-Activity Limitations  Bend;Other;Stairs;Transfers;Locomotion Level   visual scanning with head turns   Examination-Participation Restrictions  Community Activity;Driving;Shop;Cleaning;Other  work   [employer] officer  Evolving/Moderate complexity    Rehab Potential  Good    PT Frequency  2x / week   2x/week for 3 weeks followed by 1x/wk for 3 wks   PT Duration  6 weeks    PT Treatment/Interventions  ADLs/Self Care Home Management;Therapeutic  exercise;Vestibular;Neuromuscular re-education;Patient/family education;Balance training;Therapeutic activities;Gait training    PT Next Visit Plan  check balance on foam; continue vestibular/visual exercises    PT Home Exercise Plan  x1 viewing, habituation (step and pivot turn), amb. with head turns    Consulted and Agree with Plan of Care  Patient       Patient will benefit from skilled therapeutic intervention in order to improve the following deficits and impairments:  Difficulty walking, Dizziness, Decreased activity tolerance, Decreased balance  Visit Diagnosis: Dizziness and giddiness  Unsteadiness on feet     Problem List Patient Active Problem List   Diagnosis Date Noted  . Congestion of nasal sinus 08/11/2019  . Flu-like symptoms 02/08/2019  . Muscle pain 06/06/2018  . MDD (major depressive disorder), recurrent episode, severe ([place]) 09/25/2017  . Acute tension headache 05/15/2017  . Generalized anxiety disorder 05/15/2017  . Colon polyp 12/07/2016  . Irritable bowel syndrome 09/02/2016  . External hemorrhoid 09/02/2016  . Allergic rhinitis 04/29/2016  . Migraine 04/29/2016  . Vitamin D deficiency 11/17/2015  . Insomnia 07/27/2015  . Acne 03/09/2015  . Essential hypertension 02/27/2015  . Elevated serum creatinine 12/31/2014  . Fatigue 12/29/2014  . Medication management 12/29/2014  . Major depressive disorder, recurrent episode, moderate ([place]) 12/29/2014  . Chronic ITP (idiopathic thrombocytopenia) (HCC) 12/29/2014    Jennylee Uehara, [place], PT 02/10/2020, 4:57 PM  [facility] [address] [place], [place], [address] Phone: [phone]   Fax:  [phone]  Name: Douglas Knight MRN: [id number] Date of Birth: 1985/12/22

## 2020-02-10 NOTE — Patient Instructions (Signed)
Feet Apart (Compliant Surface) Head Motion - Eyes Open    With eyes open, standing on compliant surface: __pillows______, feet shoulder width apart, move head slowly: up and down. Repeat 10 times per session. Do _2___ sessions per day.  Copyright  VHI. All rights reserved.  Feet Together (Compliant Surface) Varied Arm Positions - Eyes Closed    Stand on compliant surface: _pillows_______ with feet together and arms out. Close eyes and visualize upright position. Hold_30___ seconds. Repeat ___1_ times per session. Do _1-2___ sessions per day.  (SHEET WITH 4 POSITIONS - FEET APART/TOGETHER -- EO AND EC)

## 2020-02-12 ENCOUNTER — Ambulatory Visit
Admission: RE | Admit: 2020-02-12 | Discharge: 2020-02-12 | Disposition: A | Discharge status: Home / Self Care | Payer: BC Managed Care – PPO | Source: Ambulatory Visit | Attending: Otolaryngology | Admitting: Otolaryngology

## 2020-02-12 DIAGNOSIS — R519 Headache, unspecified: Secondary | ICD-10-CM | POA: Diagnosis not present

## 2020-02-12 DIAGNOSIS — Z049 Encounter for examination and observation for unspecified reason: Secondary | ICD-10-CM | POA: Diagnosis not present

## 2020-02-12 DIAGNOSIS — G43719 Chronic migraine without aura, intractable, without status migrainosus: Secondary | ICD-10-CM | POA: Diagnosis not present

## 2020-02-12 DIAGNOSIS — H9311 Tinnitus, right ear: Secondary | ICD-10-CM

## 2020-02-12 DIAGNOSIS — Z79899 Other long term (current) drug therapy: Secondary | ICD-10-CM | POA: Diagnosis not present

## 2020-02-14 DIAGNOSIS — M9901 Segmental and somatic dysfunction of cervical region: Secondary | ICD-10-CM | POA: Diagnosis not present

## 2020-02-14 DIAGNOSIS — M9902 Segmental and somatic dysfunction of thoracic region: Secondary | ICD-10-CM | POA: Diagnosis not present

## 2020-02-14 DIAGNOSIS — M9907 Segmental and somatic dysfunction of upper extremity: Secondary | ICD-10-CM | POA: Diagnosis not present

## 2020-02-14 DIAGNOSIS — G44229 Chronic tension-type headache, not intractable: Secondary | ICD-10-CM | POA: Diagnosis not present

## 2020-02-18 ENCOUNTER — Ambulatory Visit: Payer: BC Managed Care – PPO | Admitting: Physical Therapy

## 2020-02-18 ENCOUNTER — Other Ambulatory Visit: Payer: Self-pay

## 2020-02-18 DIAGNOSIS — M9901 Segmental and somatic dysfunction of cervical region: Secondary | ICD-10-CM | POA: Diagnosis not present

## 2020-02-18 DIAGNOSIS — M9902 Segmental and somatic dysfunction of thoracic region: Secondary | ICD-10-CM | POA: Diagnosis not present

## 2020-02-18 DIAGNOSIS — R2681 Unsteadiness on feet: Secondary | ICD-10-CM

## 2020-02-18 DIAGNOSIS — M9907 Segmental and somatic dysfunction of upper extremity: Secondary | ICD-10-CM | POA: Diagnosis not present

## 2020-02-18 DIAGNOSIS — G44229 Chronic tension-type headache, not intractable: Secondary | ICD-10-CM | POA: Diagnosis not present

## 2020-02-18 DIAGNOSIS — R42 Dizziness and giddiness: Secondary | ICD-10-CM | POA: Diagnosis not present

## 2020-02-19 ENCOUNTER — Encounter: Payer: Self-pay | Admitting: Physical Therapy

## 2020-02-19 NOTE — Therapy (Signed)
[facility] [address] [place], [place], [address] Phone: [phone]   Fax:  [phone]  Physical Therapy Treatment  Patient Details  Name: Douglas Knight MRN: [id number] Date of Birth: Feb 15, 1986 Referring Provider (PT): Anselm Jungling, PA-C   Encounter Date: 02/18/2020  PT End of Session - 02/19/20 1549    Visit Number  3    Number of Visits  10    Date for PT Re-Evaluation  03/20/20    Authorization Type  BCBS    Authorization - Visit Number  3    Authorization - Number of Visits  11    PT Start Time  [id number]    PT Stop Time  1446    PT Time Calculation (min)  44 min    Equipment Utilized During Treatment  --   harness vest for SOT on Balance Master   Activity Tolerance  Patient tolerated treatment well    Behavior During Therapy  [facility] for tasks assessed/performed       Past Medical History:  Diagnosis Date  . Anal fissure   . Anxiety   . Bipolar disorder ([place])   . COVID-19   . Depression   . Hypertension   . IBS (irritable bowel syndrome)   . ITP (idiopathic thrombocytopenic purpura)   . Urticaria     Past Surgical History:  Procedure Laterality Date  . APPENDECTOMY  2003   [facility]    There were no vitals filed for this visit.  Subjective Assessment - 02/18/20 1406    Subjective  Pt reports he did the pillow exercises on Sunday and fell in hallway after doing the exercises but lowered himself to floor - did not get hurt   pt has appt with Dr. Orie Rout at the [facility] for his migraines   Pertinent History  chronic ITP:  IBS:  h/o major depressive disorder; bipolar disorder; Covid; HTN    Patient Stated Goals  regain balance, resolve the dizziness, and regain stamina    Currently in Pain?  Other (Comment)   mild headache   Pain Onset  More than a month ago                        Vestibular Treatment/Exercise - 02/19/20 0001      Vestibular Treatment/Exercise   Habituation Exercises  Comment   bouncing on blue physioball - EO and EC; w/head turns   Gaze Exercises  X1 Viewing Horizontal   3 targets - pt standing on pillows     Pt performed bouncing on blue physioball - EO and EC with head turns horizontally & vertically    Balance Exercises - 02/19/20 1540      Balance Exercises: Standing   Standing Eyes Opened  Wide (BOA);Narrow base of support (BOS);Head turns;Foam/compliant surface;5 reps   trunk rotations standing on pillows in corner   Standing Eyes Closed  Wide (BOA);Head turns;Foam/compliant surface;5 reps   trunk rotations - diagonal patterns & staight across   Gait with Head Turns  Forward;2 reps   35' x 2 reps   Other Standing Exercises  pt performed amb. tossing ball up in air and catching - straight up for 35' x 1 reps; then progressed to walking & tossing ball on Rt and Lt sides 35' x 2 reps ; pt performed amb. making circles clockwise & counterclockwise patterns     Other Standing Exercises Comments  pt performed marching on pillows -  EO with no head turns, EO with horizontal head turns, then vertical head turns; EC no head turns, then [facility] with head turns with CGA           PT Short Term Goals - 02/19/20 1558      PT SHORT TERM GOAL #1   Title  Pt will improve DHI score from 50% to </ 30% to demonstrate improvement in vertigo.    Time  3    Period  Weeks    Status  New    Target Date  02/28/20      PT SHORT TERM GOAL #2   Title  Independent in HEP for vestibular exercises.    Time  3    Period  Weeks    Status  New    Target Date  02/28/20      PT SHORT TERM GOAL #3   Title  Perform SOT and establish goal as appropriate.    Baseline  SOT completed 02-10-20 -- LTG established    Time  3    Period  Weeks    Status  Achieved    Target Date  02/28/20        PT Long Term Goals - 02/19/20 1558      PT LONG TERM GOAL #1   Title  Improve DHI score from 50 to </= 20% to demo  improvement in vertigo.    Baseline  50% on 02-03-20    Time  6    Period  Weeks    Status  New      PT LONG TERM GOAL #2   Title  Pt will subjectively report ability to perform visual scanning on cell phone and use of tablet with minimal (</= 2/10) dizziness for abiltiy to return to work activities.    Time  6    Period  Weeks    Status  New      PT LONG TERM GOAL #3   Title  Pt will amb. 55' with horizontal head turns with minimal c/o dizziness (</= 2/10) for ease with environmental scanning.    Time  6    Period  Weeks    Status  New      PT LONG TERM GOAL #4   Title  Increase vestibular input score on SOT to WNL's for improved vestibular function in maintaining balance.    Time  6    Period  Weeks    Status  New            Plan - 02/19/20 1549    Clinical Impression Statement  Pt demonstrating improvement with balance on foam exercises with pt able to maintain balance majority of the time with occasional occurrences of posterior LOB into wall with corner exercises.  Pt continues to have deficits with gaze stabilization with c/o dizziness with visual vestibular exercises.  Pt ambulates with head flexed, looking down at floor; able to amb. with head up with pt looking straight ahead, however, pt states he has some fear of falling when he looks up and does not feel as confident with balance.    Personal Factors and Comorbidities  Comorbidity 2;Profession;Behavior Pattern    Examination-Activity Limitations  Bend;Other;Stairs;Transfers;Locomotion Level   visual scanning with head turns   Examination-Participation Restrictions  Community Activity;Driving;Shop;Cleaning;Other   work   [employer] officer  Evolving/Moderate complexity    Rehab Potential  Good    PT Frequency  2x / week   2x/week for 3 weeks followed  by 1x/wk for 3 wks   PT Duration  6 weeks    PT Treatment/Interventions  ADLs/Self Care Home Management;Therapeutic exercise;Vestibular;Neuromuscular  re-education;Patient/family education;Balance training;Therapeutic activities;Gait training    PT Next Visit Plan  continue vestibular/visual exercises - especially incorporating visual tasks with head movement    PT Home Exercise Plan  x1 viewing, habituation (step and pivot turn), amb. with head turns; balance on foam    Consulted and Agree with Plan of Care  Patient       Patient will benefit from skilled therapeutic intervention in order to improve the following deficits and impairments:  Difficulty walking, Dizziness, Decreased activity tolerance, Decreased balance  Visit Diagnosis: Dizziness and giddiness  Unsteadiness on feet     Problem List Patient Active Problem List   Diagnosis Date Noted  . Congestion of nasal sinus 08/11/2019  . Flu-like symptoms 02/08/2019  . Muscle pain 06/06/2018  . MDD (major depressive disorder), recurrent episode, severe ([place]) 09/25/2017  . Acute tension headache 05/15/2017  . Generalized anxiety disorder 05/15/2017  . Colon polyp 12/07/2016  . Irritable bowel syndrome 09/02/2016  . External hemorrhoid 09/02/2016  . Allergic rhinitis 04/29/2016  . Migraine 04/29/2016  . Vitamin D deficiency 11/17/2015  . Insomnia 07/27/2015  . Acne 03/09/2015  . Essential hypertension 02/27/2015  . Elevated serum creatinine 12/31/2014  . Fatigue 12/29/2014  . Medication management 12/29/2014  . Major depressive disorder, recurrent episode, moderate ([place]) 12/29/2014  . Chronic ITP (idiopathic thrombocytopenia) (HCC) 12/29/2014    Liticia Gasior, [place], PT 02/19/2020, 4:02 PM  [facility] [address] [place], [place], [address] Phone: [phone]   Fax:  [phone]  Name: Douglas Knight MRN: [id number] Date of Birth: Jul 06, 1986

## 2020-02-21 ENCOUNTER — Other Ambulatory Visit: Payer: Self-pay

## 2020-02-21 ENCOUNTER — Ambulatory Visit: Payer: BC Managed Care – PPO | Admitting: Physical Therapy

## 2020-02-21 DIAGNOSIS — M9902 Segmental and somatic dysfunction of thoracic region: Secondary | ICD-10-CM | POA: Diagnosis not present

## 2020-02-21 DIAGNOSIS — M9901 Segmental and somatic dysfunction of cervical region: Secondary | ICD-10-CM | POA: Diagnosis not present

## 2020-02-21 DIAGNOSIS — R2681 Unsteadiness on feet: Secondary | ICD-10-CM

## 2020-02-21 DIAGNOSIS — G44229 Chronic tension-type headache, not intractable: Secondary | ICD-10-CM | POA: Diagnosis not present

## 2020-02-21 DIAGNOSIS — R42 Dizziness and giddiness: Secondary | ICD-10-CM

## 2020-02-21 DIAGNOSIS — M9907 Segmental and somatic dysfunction of upper extremity: Secondary | ICD-10-CM | POA: Diagnosis not present

## 2020-02-21 NOTE — Therapy (Signed)
[facility] [address] [place], [place], [address] Phone: [phone]   Fax:  [phone]  Physical Therapy Treatment  Patient Details  Name: Douglas Knight MRN: [id number] Date of Birth: 12-04-1985 Referring Provider (PT): Anselm Jungling, PA-C   Encounter Date: 02/21/2020  PT End of Session - 02/21/20 1157    Visit Number  4    Number of Visits  10    Date for PT Re-Evaluation  03/20/20    Authorization Type  BCBS    Authorization - Visit Number  4    Authorization - Number of Visits  60    PT Start Time  [id number]    PT Stop Time  1100    PT Time Calculation (min)  45 min    Equipment Utilized During Treatment  --    Activity Tolerance  Patient tolerated treatment well    Behavior During Therapy  [facility] for tasks assessed/performed       Past Medical History:  Diagnosis Date  . Anal fissure   . Anxiety   . Bipolar disorder ([place])   . COVID-19   . Depression   . Hypertension   . IBS (irritable bowel syndrome)   . ITP (idiopathic thrombocytopenic purpura)   . Urticaria     Past Surgical History:  Procedure Laterality Date  . APPENDECTOMY  2003   [facility]    There were no vitals filed for this visit.  Subjective Assessment - 02/21/20 1019    Subjective  No further falls in the hallway when doing exercises; yesterday was a bad day for dizziness but today is a good day.  Has been practicing looking forwards when walking and mom park far away from building so he could walk in and practice looking ahead.  Has been practicing driving short distance - about 5 minutes away - sharp turns are hard.  For his work he has to drive [phone] miles away.   pt has appt with Dr. Orie Rout at the [facility] for his migraines   Pertinent History  chronic ITP:  IBS:  h/o major depressive disorder; bipolar disorder; Covid; HTN    Patient Stated Goals  regain balance, resolve the dizziness, and regain  stamina    Currently in Pain?  Yes    Pain Score  1     Pain Location  Head    Pain Onset  More than a month ago                       [facility] Adult PT Treatment/Exercise - 02/21/20 1058      Ambulation/Gait   Ambulation/Gait  Yes    Ambulation/Gait Assistance  6: Modified independent (Device/Increase time)      Exercises   Exercises  Knee/Hip      Knee/Hip Exercises: Aerobic   Tread Mill  1.0 > 1.2 mph x 8 minutes with intermittent sets of 5 reps head turns L and R, head nods up and down, mild nausea afterwards      Vestibular Treatment/Exercise - 02/21/20 1022      Vestibular Treatment/Exercise   Vestibular Treatment Provided  Gaze    Gaze Exercises  X1 Viewing Horizontal;X1 Viewing Vertical      X1 Viewing Horizontal   Foot Position  first in standing without UE support, feet wide - changed to UE support on chair due to increased neck/shoulder tension     Reps  2  Comments  60 seconds; nausea      X1 Viewing Vertical   Foot Position  standing, feet apart, UE support > progressed to standing feet closer together, no UE support    Reps  2    Comments  60 seconds, no symptoms            PT Education - 02/21/20 1155    Education Details  no changes to HEP - encouraged pt to use chair for UE support to keep shoulders relaxed to be able to perform smoother head movement.  Advised pt to consider returning to a gym to use treadmill and perform gentle exercises.    Person(s) Educated  Patient    Methods  Explanation    Comprehension  Verbalized understanding       PT Short Term Goals - 02/19/20 1558      PT SHORT TERM GOAL #1   Title  Pt will improve DHI score from 50% to </ 30% to demonstrate improvement in vertigo.    Time  3    Period  Weeks    Status  New    Target Date  02/28/20      PT SHORT TERM GOAL #2   Title  Independent in HEP for vestibular exercises.    Time  3    Period  Weeks    Status  New    Target Date  02/28/20      PT  SHORT TERM GOAL #3   Title  Perform SOT and establish goal as appropriate.    Baseline  SOT completed 02-10-20 -- LTG established    Time  3    Period  Weeks    Status  Achieved    Target Date  02/28/20        PT Long Term Goals - 02/19/20 1558      PT LONG TERM GOAL #1   Title  Improve DHI score from 50 to </= 20% to demo improvement in vertigo.    Baseline  50% on 02-03-20    Time  6    Period  Weeks    Status  New      PT LONG TERM GOAL #2   Title  Pt will subjectively report ability to perform visual scanning on cell phone and use of tablet with minimal (</= 2/10) dizziness for abiltiy to return to work activities.    Time  6    Period  Weeks    Status  New      PT LONG TERM GOAL #3   Title  Pt will amb. 43' with horizontal head turns with minimal c/o dizziness (</= 2/10) for ease with environmental scanning.    Time  6    Period  Weeks    Status  New      PT LONG TERM GOAL #4   Title  Increase vestibular input score on SOT to WNL's for improved vestibular function in maintaining balance.    Time  6    Period  Weeks    Status  New            Plan - 02/21/20 1157    Clinical Impression Statement  Pt is making attempts to increase activity level each day, walking further distances and keeping eyes focused forwards, not on floor.  He is also driving short distances.  Pt continues to demonstrate increased shoulder/neck tension and continues to move en bloc at times.  Not able to progress x1 viewing today  due to pt continues to require some UE support to keep shoulders relaxed and to keep symptoms mild.  Initiated gait training on treadmill to focus on increasing gait speed, adding in head turns and for aerobic conditioning.  Mild symptoms after performing.  Will continue to progress towards LTG as pt is able to tolerate.    Personal Factors and Comorbidities  Comorbidity 2;Profession;Behavior Pattern    Examination-Activity Limitations   Bend;Other;Stairs;Transfers;Locomotion Level   visual scanning with head turns   Examination-Participation Restrictions  Community Activity;Driving;Shop;Cleaning;Other   work   [employer] officer  Evolving/Moderate complexity    Rehab Potential  Good    PT Frequency  2x / week   2x/week for 3 weeks followed by 1x/wk for 3 wks   PT Duration  6 weeks    PT Treatment/Interventions  ADLs/Self Care Home Management;Therapeutic exercise;Vestibular;Neuromuscular re-education;Patient/family education;Balance training;Therapeutic activities;Gait training    PT Next Visit Plan  STG due 4/2.  treadmill to work on increasing gait speed.  Progress x1 as able.    PT Home Exercise Plan  x1 viewing, habituation (step and pivot turn), amb. with head turns; balance on foam    Consulted and Agree with Plan of Care  Patient       Patient will benefit from skilled therapeutic intervention in order to improve the following deficits and impairments:  Difficulty walking, Dizziness, Decreased activity tolerance, Decreased balance  Visit Diagnosis: Dizziness and giddiness  Unsteadiness on feet     Problem List Patient Active Problem List   Diagnosis Date Noted  . Congestion of nasal sinus 08/11/2019  . Flu-like symptoms 02/08/2019  . Muscle pain 06/06/2018  . MDD (major depressive disorder), recurrent episode, severe ([place]) 09/25/2017  . Acute tension headache 05/15/2017  . Generalized anxiety disorder 05/15/2017  . Colon polyp 12/07/2016  . Irritable bowel syndrome 09/02/2016  . External hemorrhoid 09/02/2016  . Allergic rhinitis 04/29/2016  . Migraine 04/29/2016  . Vitamin D deficiency 11/17/2015  . Insomnia 07/27/2015  . Acne 03/09/2015  . Essential hypertension 02/27/2015  . Elevated serum creatinine 12/31/2014  . Fatigue 12/29/2014  . Medication management 12/29/2014  . Major depressive disorder, recurrent episode, moderate ([place]) 12/29/2014  . Chronic ITP (idiopathic  thrombocytopenia) (HCC) 12/29/2014    Rico Junker, PT, DPT 02/21/20    12:51 PM    [facility] [address] [place], [place], [address] Phone: [phone]   Fax:  [phone]  Name: Tiran Kochel MRN: [id number] Date of Birth: 09-09-1986

## 2020-02-21 NOTE — Patient Instructions (Addendum)
Feet Apart (Compliant Surface) Head Motion - Eyes Open    With eyes open, standing on compliant surface: __pillows______, feet shoulder width apart, move head slowly: up and down. Repeat 10 times per session. Do _2___ sessions per day.  Copyright  VHI. All rights reserved.  Feet Together (Compliant Surface) Varied Arm Positions - Eyes Closed    Stand on compliant surface: _pillows_______ with feet together and arms out. Close eyes and visualize upright position. Hold_30___ seconds. Repeat ___1_ times per session. Do _1-2___ sessions per day.  (SHEET WITH 4 POSITIONS - FEET APART/TOGETHER -- EO AND EC)   Gaze Stabilization: Tip Card  1.Target must remain in focus, not blurry, and appear stationary while head is in motion. 2.Perform exercises with small head movements (45 to either side of midline). 3.Increase speed of head motion so long as target is in focus. 4.If you wear eyeglasses, be sure you can see target through lens (therapist will give specific instructions for bifocal / progressive lenses). 5.These exercises may provoke dizziness or nausea. Work through these symptoms. If too dizzy, slow head movement slightly. Rest between each exercise. 6.Exercises demand concentration; avoid distractions. 7.For safety, perform standing exercises close to a counter, wall, corner, or next to someone.  Gaze Stabilization: Standing Feet Apart    Feet shoulder width apart, keeping eyes on target on wall _6___ feet away, tilt head down 15-30 and move head side to side for _60___ seconds. Repeat while moving head up and down for _60___ seconds.  Have a chair in front to touch for support if needed to keep shoulders relaxed.  Monitor symptoms, if you start to feel nauseous - slow down or take a break. Do _3-5___ sessions per day.  Also DO:   1) SIT TO STAND - STEP FORWARD RIGHT AND TURN LEFT                                 STEP FORWARD LEFT AND TURN RIGHT 5-10 TIMES   2)  INCORPORATE  HEAD TURNS SIDE TO SIDE DURING WALKING

## 2020-02-24 DIAGNOSIS — G518 Other disorders of facial nerve: Secondary | ICD-10-CM | POA: Diagnosis not present

## 2020-02-24 DIAGNOSIS — G43719 Chronic migraine without aura, intractable, without status migrainosus: Secondary | ICD-10-CM | POA: Diagnosis not present

## 2020-02-24 DIAGNOSIS — M542 Cervicalgia: Secondary | ICD-10-CM | POA: Diagnosis not present

## 2020-02-24 DIAGNOSIS — M791 Myalgia, unspecified site: Secondary | ICD-10-CM | POA: Diagnosis not present

## 2020-02-24 DIAGNOSIS — G501 Atypical facial pain: Secondary | ICD-10-CM | POA: Diagnosis not present

## 2020-02-26 ENCOUNTER — Encounter: Payer: Self-pay | Admitting: Physical Therapy

## 2020-02-26 ENCOUNTER — Ambulatory Visit: Payer: BC Managed Care – PPO | Admitting: Physical Therapy

## 2020-02-26 ENCOUNTER — Other Ambulatory Visit: Payer: Self-pay

## 2020-02-26 DIAGNOSIS — R42 Dizziness and giddiness: Secondary | ICD-10-CM

## 2020-02-26 DIAGNOSIS — M9901 Segmental and somatic dysfunction of cervical region: Secondary | ICD-10-CM | POA: Diagnosis not present

## 2020-02-26 DIAGNOSIS — M9902 Segmental and somatic dysfunction of thoracic region: Secondary | ICD-10-CM | POA: Diagnosis not present

## 2020-02-26 DIAGNOSIS — R2681 Unsteadiness on feet: Secondary | ICD-10-CM

## 2020-02-26 DIAGNOSIS — M9907 Segmental and somatic dysfunction of upper extremity: Secondary | ICD-10-CM | POA: Diagnosis not present

## 2020-02-26 DIAGNOSIS — G44229 Chronic tension-type headache, not intractable: Secondary | ICD-10-CM | POA: Diagnosis not present

## 2020-02-26 NOTE — Therapy (Signed)
[facility] [address] [place], [place], [address] Phone: [phone]   Fax:  [phone]  Physical Therapy Treatment  Patient Details  Name: Douglas Knight MRN: [id number] Date of Birth: 12-17-1985 Referring Provider (PT): Anselm Jungling, PA-C   Encounter Date: 02/26/2020  PT End of Session - 02/26/20 1634    Visit Number  5    Number of Visits  10    Date for PT Re-Evaluation  03/20/20    Authorization Type  BCBS    Authorization - Visit Number  5    Authorization - Number of Visits  60    PT Start Time  1320    PT Stop Time  1408    PT Time Calculation (min)  48 min    Activity Tolerance  Patient tolerated treatment well    Behavior During Therapy  [facility] for tasks assessed/performed       Past Medical History:  Diagnosis Date  . Anal fissure   . Anxiety   . Bipolar disorder ([place])   . COVID-19   . Depression   . Hypertension   . IBS (irritable bowel syndrome)   . ITP (idiopathic thrombocytopenic purpura)   . Urticaria     Past Surgical History:  Procedure Laterality Date  . APPENDECTOMY  2003   [facility]    There were no vitals filed for this visit.  Subjective Assessment - 02/26/20 1322    Subjective  [place], cars going by, rain and windshield wipers made him dizzy.  Went to see a headache specialist to discuss headache medicine and changes in mood - physician told him, "thanks for the feedback but I'm not going to offer you anything different."  Also gave him trigger point injections that had a lot of extra ingredients (physicians's own mixture) that caused him to feel really "jacked up" and then had a rebound headache.  Did not do much the past few days and has been in bed more.   pt has appt with Dr. Orie Rout at the [facility] for his migraines   Pertinent History  chronic ITP:  IBS:  h/o major  depressive disorder; bipolar disorder; Covid; HTN    Patient Stated Goals  regain balance, resolve the dizziness, and regain stamina    Currently in Pain?  Yes    Pain Score  4    dizzines 2-3;   Pain Location  Head    Pain Descriptors / Indicators  Headache    Pain Onset  More than a month ago                       [name] Adult PT Treatment/Exercise - 02/26/20 1628      Bed Mobility   Bed Mobility  Rolling Right;Right Sidelying to Sit;Sit to Supine    Rolling Right  Contact Guard/Touching assist   slowly after TDN due to dizziness   Right Sidelying to Sit  Contact Guard/Touching assist   slowly after TDN due to dizziness   Sit to Supine  Independent      Transfers   Transfers  Sit to Stand    Sit to Stand  5: Supervision    Sit to Stand Details (indicate cue type and reason)  slowly after TDN due to dizziness      Ambulation/Gait   Ambulation/Gait  Yes    Ambulation/Gait Assistance  5: Supervision    Ambulation/Gait Assistance Details  pt more guarded with head movement after dry needling due to anticipation of dizziness; ambulating very slowly    Ambulation Distance (Feet)  115 Feet    Assistive device  None      Therapeutic Activites    Therapeutic Activities  Other Therapeutic Activities    Other Therapeutic Activities  reviewed contraindications to dry needling; no contraindications noted.  Educated pt on the use, methodology and purpose of dry needling - reinforced that needles do not contain medication.  Also discussed common side effects and recommendations for after dry needling.  Pt verbalized understanding and provided consent for dry needling.  Pt willing to try to improve headache with dry needling.      Modalities   Modalities  Cryotherapy      Cryotherapy   Number Minutes Cryotherapy  5 Minutes    Cryotherapy Location  Cervical    Type of Cryotherapy  Ice massage      Manual Therapy   Manual Therapy  Passive ROM;Manual Traction;Myofascial  release    Manual therapy comments  following trigger point dry needling to upper trapezius    Myofascial Release  suboccipital release in supine after TDN, 2 sets x 2-3 minutes at a time    Passive ROM  R upper trapezius after dry needling to improve functional ROM    Manual Traction  x 2 sets x 1 minute each following trigger point dry needling       Trigger Point Dry Needling - 02/26/20 1632    Consent Given?  Yes    Education Handout Provided  No    Muscles Treated Head and Neck  Upper trapezius    Dry Needling Comments  performed on L and R; initially R side demonstrating decreased functional ROM and pt holding head to the R; after dry needling pt reported improved pain free ROM on R side but continued tension and pain on L side with rotation to L    Upper Trapezius Response  Twitch reponse elicited;Palpable increased muscle length           PT Education - 02/26/20 1634    Education Details  dry needling    Person(s) Educated  Patient    Methods  Explanation    Comprehension  Verbalized understanding       PT Short Term Goals - 02/19/20 1558      PT SHORT TERM GOAL #1   Title  Pt will improve DHI score from 50% to </ 30% to demonstrate improvement in vertigo.    Time  3    Period  Weeks    Status  New    Target Date  02/28/20      PT SHORT TERM GOAL #2   Title  Independent in HEP for vestibular exercises.    Time  3    Period  Weeks    Status  New    Target Date  02/28/20      PT SHORT TERM GOAL #3   Title  Perform SOT and establish goal as appropriate.    Baseline  SOT completed 02-10-20 -- LTG established    Time  3    Period  Weeks    Status  Achieved    Target Date  02/28/20        PT Long Term Goals - 02/19/20 1558      PT LONG TERM GOAL #1   Title  Improve DHI score from 50  to </= 20% to demo improvement in vertigo.    Baseline  50% on 02-03-20    Time  6    Period  Weeks    Status  New      PT LONG TERM GOAL #2   Title  Pt will subjectively  report ability to perform visual scanning on cell phone and use of tablet with minimal (</= 2/10) dizziness for abiltiy to return to work activities.    Time  6    Period  Weeks    Status  New      PT LONG TERM GOAL #3   Title  Pt will amb. 16' with horizontal head turns with minimal c/o dizziness (</= 2/10) for ease with environmental scanning.    Time  6    Period  Weeks    Status  New      PT LONG TERM GOAL #4   Title  Increase vestibular input score on SOT to WNL's for improved vestibular function in maintaining balance.    Time  6    Period  Weeks    Status  New            Plan - 02/26/20 1634    Clinical Impression Statement  Pt presents with increaesed headache today and had negative experience with physician and trigger point injections.  Educated pt on use of dry needling for trigger point management to decrease pain and improve functional ROM.  Pt agreeable. Address headache pain and decreased ROM with trigger point dry needling to R and L upper trap; discomfort too intense on R side for pt to tolerate today but pt demonstrated significantly greater improvement in pain free ROM on R side vs. L side.  Pt agreeable to target L side next week.  Also included manual therapy to address upper cervical tension and for headache management.  Pt reported slight improvement in HA at end of session.  Will continue to address in order to progress towards LTG.    Personal Factors and Comorbidities  Comorbidity 2;Profession;Behavior Pattern    Examination-Activity Limitations  Bend;Other;Stairs;Transfers;Locomotion Level   visual scanning with head turns   Examination-Participation Restrictions  Community Activity;Driving;Shop;Cleaning;Other   work   [employer]  Evolving/Moderate complexity    Rehab Potential  Good    PT Frequency  2x / week   2x/week for 3 weeks followed by 1x/wk for 3 wks   PT Duration  6 weeks    PT Treatment/Interventions  ADLs/Self Care  Home Management;Therapeutic exercise;Vestibular;Neuromuscular re-education;Patient/family education;Balance training;Therapeutic activities;Gait training;Manual techniques;Passive range of motion;Dry needling    PT Next Visit Plan  Added dry needling to POC; may switch patients on Tuesday so I can address L side of neck/shoulder; Address STG on Monday.  treadmill to work on increasing gait speed.  Progress x1 as able.    PT Home Exercise Plan  x1 viewing, habituation (step and pivot turn), amb. with head turns; balance on foam    Consulted and Agree with Plan of Care  Patient       Patient will benefit from skilled therapeutic intervention in order to improve the following deficits and impairments:  Difficulty walking, Dizziness, Decreased activity tolerance, Decreased balance, Decreased range of motion, Decreased mobility, Impaired perceived functional ability, Pain  Visit Diagnosis: Dizziness and giddiness  Unsteadiness on feet     Problem List Patient Active Problem List   Diagnosis Date Noted  . Congestion of nasal sinus 08/11/2019  . Flu-like symptoms 02/08/2019  .  Muscle pain 06/06/2018  . MDD (major depressive disorder), recurrent episode, severe ([place]) 09/25/2017  . Acute tension headache 05/15/2017  . Generalized anxiety disorder 05/15/2017  . Colon polyp 12/07/2016  . Irritable bowel syndrome 09/02/2016  . External hemorrhoid 09/02/2016  . Allergic rhinitis 04/29/2016  . Migraine 04/29/2016  . Vitamin D deficiency 11/17/2015  . Insomnia 07/27/2015  . Acne 03/09/2015  . Essential hypertension 02/27/2015  . Elevated serum creatinine 12/31/2014  . Fatigue 12/29/2014  . Medication management 12/29/2014  . Major depressive disorder, recurrent episode, moderate ([place]) 12/29/2014  . Chronic ITP (idiopathic thrombocytopenia) ([facility]) 12/29/2014    Rico Junker, PT, DPT 02/26/20    4:43 PM    [facility] [address] [place], [place], [address] Phone: [phone]   Fax:  [phone]  Name: Angelia Mordhorst MRN: [id number] Date of Birth: September 07, 1986

## 2020-02-26 NOTE — Patient Instructions (Signed)

## 2020-03-02 ENCOUNTER — Other Ambulatory Visit: Payer: Self-pay

## 2020-03-02 ENCOUNTER — Encounter: Payer: Self-pay | Admitting: Physical Therapy

## 2020-03-02 ENCOUNTER — Ambulatory Visit: Payer: BC Managed Care – PPO | Attending: Pediatrics | Admitting: Physical Therapy

## 2020-03-02 DIAGNOSIS — R2681 Unsteadiness on feet: Secondary | ICD-10-CM | POA: Diagnosis not present

## 2020-03-02 DIAGNOSIS — R42 Dizziness and giddiness: Secondary | ICD-10-CM

## 2020-03-02 NOTE — Therapy (Signed)
[facility] [address] [place], [place], [address] Phone: [phone]   Fax:  [phone]  Physical Therapy Treatment  Patient Details  Name: Douglas Knight MRN: [id number] Date of Birth: [birth year] Referring Provider (PT): Anselm Jungling, PA-C   Encounter Date: 03/02/2020  PT End of Session - 03/02/20 1950    Visit Number  6    Number of Visits  10    Date for PT Re-Evaluation  03/20/20    Authorization Type  BCBS    Authorization - Visit Number  6    Authorization - Number of Visits  60    PT Start Time  0850    PT Stop Time  0932    PT Time Calculation (min)  42 min    Activity Tolerance  Patient tolerated treatment well    Behavior During Therapy  [facility] for tasks assessed/performed       Past Medical History:  Diagnosis Date  . Anal fissure   . Anxiety   . Bipolar disorder ([place])   . COVID-19   . Depression   . Hypertension   . IBS (irritable bowel syndrome)   . ITP (idiopathic thrombocytopenic purpura)   . Urticaria     Past Surgical History:  Procedure Laterality Date  . APPENDECTOMY  2003   [facility]    There were no vitals filed for this visit.  Subjective Assessment - 03/02/20 0851    Subjective  Pt received dry needling last week - says it helped alot and he would like to have another treatment; still has neurology appt at end of April; saw MD about his headaches and the medicine he was prescribed he could not take it - made him irritable so he stopped taking it   pt has appt with Dr. Orie Rout at the [facility] for his migraines   Pertinent History  chronic ITP:  IBS:  h/o major depressive disorder; bipolar disorder; Covid; HTN    Patient Stated Goals  regain balance, resolve the dizziness, and regain stamina    Currently in Pain?  Yes    Pain Score  4    headache   Pain Location  Head    Pain Orientation  Other (Comment)   headache   Pain Descriptors /  Indicators  Headache    Pain Type  Chronic pain    Pain Onset  More than a month ago    Pain Frequency  Intermittent        NeuroRe-ed:    Other Standing Exercises  pt performed amb. tossing ball up in air and catching - straight up for 35' x 1 reps; then progressed to walking & tossing ball on Rt and Lt sides 35' x 2 reps ; pt performed amb. making circles clockwise & counterclockwise patterns     Pt performed amb. 2 laps around track - reading playing cards on Rt and Lt sides [place], PT assisted with this activity)  Pt performed amb. Tossing ball on Rt and Lt sides - catching and tossing back [place], PT assisted with this activity also)  Pt performed marching in place on incline/decline - EO and EC with head turns horizontally and vertically 5 reps each with CGA  Reaching diagonally down to floor and up to opposite side - 5 reps each - standing on blue mat  Pt performed jumping on mini trampoline - EO 5 reps x 2 sets; EC 5 reps x 2 sets with  CGA - no UE support; 1/2 jumping jacks 5 reps with EO  Pt reported some mod. Increase in HA after jumping activity as described above                Balance Exercises - 03/02/20 1947      Balance Exercises: Standing   Gait with Head Turns  Forward;2 reps   40' x 2 reps total - horizontal & vertical   Partial Tandem Stance  Eyes open;Foam/compliant surface;2 reps   tracking patterned ball  - each foot position         PT Short Term Goals - 03/02/20 0854      PT SHORT TERM GOAL #1   Title  Pt will improve DHI score from 50% to </ 30% to demonstrate improvement in vertigo.    Baseline  50% at initial;  52% on 03-02-20    Time  3    Period  Weeks    Status  Not Met    Target Date  02/28/20      PT SHORT TERM GOAL #2   Title  Independent in HEP for vestibular exercises.    Baseline  met 03-02-20    Time  3    Period  Weeks    Status  Achieved    Target Date  02/28/20      PT SHORT TERM GOAL #3    Title  Perform SOT and establish goal as appropriate.    Baseline  SOT completed 02-10-20 -- LTG established    Time  3    Period  Weeks    Status  Achieved    Target Date  02/28/20        PT Long Term Goals - 03/02/20 1955      PT LONG TERM GOAL #1   Title  Improve DHI score from 50 to </= 20% to demo improvement in vertigo.    Baseline  50% on 02-03-20    Time  6    Period  Weeks    Status  New      PT LONG TERM GOAL #2   Title  Pt will subjectively report ability to perform visual scanning on cell phone and use of tablet with minimal (</= 2/10) dizziness for abiltiy to return to work activities.    Time  6    Period  Weeks    Status  New      PT LONG TERM GOAL #3   Title  Pt will amb. 78' with horizontal head turns with minimal c/o dizziness (</= 2/10) for ease with environmental scanning.    Time  6    Period  Weeks    Status  New      PT LONG TERM GOAL #4   Title  Increase vestibular input score on SOT to WNL's for improved vestibular function in maintaining balance.    Time  6    Period  Weeks    Status  New            Plan - 03/02/20 1952    Clinical Impression Statement  Pt has met 2/3 STG's with [place] goal not met as score has actually decreased from 50% to 52% as scored at today's session.  Pt reports headache more than dizziness with vestibular/balance activities; pt reports greater dizziness and exhbits more unsteadiness with activities with EC on compliant surfaces, indicative of vestibular hypofunction.    Personal Factors and Comorbidities  Comorbidity 2;Profession;Behavior Pattern  Examination-Activity Limitations  Bend;Other;Stairs;Transfers;Locomotion Level   visual scanning with head turns   Examination-Participation Restrictions  Community Activity;Driving;Shop;Cleaning;Other   work   [employer] officer  Evolving/Moderate complexity    Rehab Potential  Good    PT Frequency  2x / week   2x/week for 3 weeks followed by 1x/wk for 3  wks   PT Duration  6 weeks    PT Treatment/Interventions  ADLs/Self Care Home Management;Therapeutic exercise;Vestibular;Neuromuscular re-education;Patient/family education;Balance training;Therapeutic activities;Gait training;Manual techniques;Passive range of motion;Dry needling    PT Next Visit Plan  Added dry needling to POC; may switch patients on Tuesday so I can address L side of neck/shoulder;    PT Home Exercise Plan  x1 viewing, habituation (step and pivot turn), amb. with head turns; balance on foam    Consulted and Agree with Plan of Care  Patient       Patient will benefit from skilled therapeutic intervention in order to improve the following deficits and impairments:  Difficulty walking, Dizziness, Decreased activity tolerance, Decreased balance, Decreased range of motion, Decreased mobility, Impaired perceived functional ability, Pain  Visit Diagnosis: Dizziness and giddiness  Unsteadiness on feet     Problem List Patient Active Problem List   Diagnosis Date Noted  . Congestion of nasal sinus 08/11/2019  . Flu-like symptoms 02/08/2019  . Muscle pain 06/06/2018  . MDD (major depressive disorder), recurrent episode, severe ([place]) 09/25/2017  . Acute tension headache 05/15/2017  . Generalized anxiety disorder 05/15/2017  . Colon polyp 12/07/2016  . Irritable bowel syndrome 09/02/2016  . External hemorrhoid 09/02/2016  . Allergic rhinitis 04/29/2016  . Migraine 04/29/2016  . Vitamin D deficiency 11/17/2015  . Insomnia 07/27/2015  . Acne 03/09/2015  . Essential hypertension 02/27/2015  . Elevated serum creatinine 12/31/2014  . Fatigue 12/29/2014  . Medication management 12/29/2014  . Major depressive disorder, recurrent episode, moderate ([place]) 12/29/2014  . Chronic ITP (idiopathic thrombocytopenia) (HCC) 12/29/2014    Gerhart Ruggieri, [place], PT 03/02/2020, 7:59 PM  [facility] [address] [place], [place], [address] Phone: [phone]   Fax:  [phone]  Name: Genevieve Ritzel MRN: [id number] Date of Birth: November 07, 1986

## 2020-03-03 ENCOUNTER — Ambulatory Visit: Payer: BC Managed Care – PPO | Admitting: Physical Therapy

## 2020-03-03 DIAGNOSIS — R42 Dizziness and giddiness: Secondary | ICD-10-CM

## 2020-03-03 DIAGNOSIS — R2681 Unsteadiness on feet: Secondary | ICD-10-CM | POA: Diagnosis not present

## 2020-03-04 ENCOUNTER — Encounter: Payer: Self-pay | Admitting: Physical Therapy

## 2020-03-04 DIAGNOSIS — Z8616 Personal history of COVID-19: Secondary | ICD-10-CM | POA: Diagnosis not present

## 2020-03-04 DIAGNOSIS — G4489 Other headache syndrome: Secondary | ICD-10-CM | POA: Diagnosis not present

## 2020-03-04 NOTE — Therapy (Signed)
[facility] [address] [place], [place], [address] Phone: [phone]   Fax:  [phone]  Physical Therapy Treatment  Patient Details  Name: Douglas Knight MRN: [id number] Date of Birth: 02/14/1986 Referring Provider (PT): Anselm Jungling, PA-C   Encounter Date: 03/03/2020  PT End of Session - 03/04/20 2048    Visit Number  7    Number of Visits  10    Date for PT Re-Evaluation  03/20/20    Authorization Type  BCBS    Authorization - Visit Number  7    Authorization - Number of Visits  60    PT Start Time  [phone]    PT Stop Time  0930    PT Time Calculation (min)  44 min    Activity Tolerance  Patient tolerated treatment well    Behavior During Therapy  [facility] for tasks assessed/performed       Past Medical History:  Diagnosis Date  . Anal fissure   . Anxiety   . Bipolar disorder ([place])   . COVID-19   . Depression   . Hypertension   . IBS (irritable bowel syndrome)   . ITP (idiopathic thrombocytopenic purpura)   . Urticaria     Past Surgical History:  Procedure Laterality Date  . APPENDECTOMY  2003   [facility]    There were no vitals filed for this visit.                         Balance Exercises - 03/04/20 2045      Balance Exercises: Standing   Standing Eyes Closed  Narrow base of support (BOS);Wide (BOA);Head turns;Foam/compliant surface;5 reps    Gait with Head Turns  Forward   115' - intermittent head turns   Turning  Right;Left;Other reps (comment)   4 reps to each side   Marching  Foam/compliant surface;Head turns;Static   10 reps horizontal & vertical head turns   Other Standing Exercises  Pt performed gaze stabilization exercise on Balance Master - 2 targets - 1 on Rt and Lt sides - moving surround at 100% with support not moving - 1" x 2 reps with pt looking at targets on each side      Pt performed habituation exercise - reaching for 6 cones  on floor - turning 180 degrees to place on high cabinet shelf; turned  To Rt and Lt sides; then placed cones from shelf back down onto floor with turns Rt and Lt alternating  pt performed marching in place on incline/decline on blue mat with EO and EC - with horizontal and vertical head turns with CGA  PT Short Term Goals - 03/04/20 2052      PT SHORT TERM GOAL #1   Title  Pt will improve DHI score from 50% to </ 30% to demonstrate improvement in vertigo.    Baseline  50% at initial;  52% on 03-02-20    Time  3    Period  Weeks    Status  Not Met    Target Date  02/28/20      PT SHORT TERM GOAL #2   Title  Independent in HEP for vestibular exercises.    Baseline  met 03-02-20    Time  3    Period  Weeks    Status  Achieved    Target Date  02/28/20      PT SHORT TERM GOAL #3   Title  Perform SOT and establish goal as appropriate.    Baseline  SOT completed 02-10-20 -- LTG established    Time  3    Period  Weeks    Status  Achieved    Target Date  02/28/20        PT Long Term Goals - 03/04/20 2053      PT LONG TERM GOAL #1   Title  Improve DHI score from 50 to </= 20% to demo improvement in vertigo.    Baseline  50% on 02-03-20    Time  6    Period  Weeks    Status  New      PT LONG TERM GOAL #2   Title  Pt will subjectively report ability to perform visual scanning on cell phone and use of tablet with minimal (</= 2/10) dizziness for abiltiy to return to work activities.    Time  6    Period  Weeks    Status  New      PT LONG TERM GOAL #3   Title  Pt will amb. 20' with horizontal head turns with minimal c/o dizziness (</= 2/10) for ease with environmental scanning.    Time  6    Period  Weeks    Status  New      PT LONG TERM GOAL #4   Title  Increase vestibular input score on SOT to WNL's for improved vestibular function in maintaining balance.    Time  6    Period  Weeks    Status  New            Plan - 03/04/20 2049    Clinical Impression Statement  Pt  reported feeling somewhat nauseous with gaze stabilization exercise with 2 targets on Balance Master exercise with surround moving at 100% (support not moving).  Visual vestibular exercises appear to be the most challenging for pt due to vestibular hypofunction.    Personal Factors and Comorbidities  Comorbidity 2;Profession;Behavior Pattern    Examination-Activity Limitations  Bend;Other;Stairs;Transfers;Locomotion Level   visual scanning with head turns   Examination-Participation Restrictions  Community Activity;Driving;Shop;Cleaning;Other   work   [employer] officer  Evolving/Moderate complexity    Rehab Potential  Good    PT Frequency  2x / week   2x/week for 3 weeks followed by 1x/wk for 3 wks   PT Duration  6 weeks    PT Treatment/Interventions  ADLs/Self Care Home Management;Therapeutic exercise;Vestibular;Neuromuscular re-education;Patient/family education;Balance training;Therapeutic activities;Gait training;Manual techniques;Passive range of motion;Dry needling    PT Next Visit Plan  Added dry needling to POC; cont DN and vestibular/gaze stabilization execises    PT Home Exercise Plan  x1 viewing, habituation (step and pivot turn), amb. with head turns; balance on foam    Consulted and Agree with Plan of Care  Patient       Patient will benefit from skilled therapeutic intervention in order to improve the following deficits and impairments:  Difficulty walking, Dizziness, Decreased activity tolerance, Decreased balance, Decreased range of motion, Decreased mobility, Impaired perceived functional ability, Pain  Visit Diagnosis: Dizziness and giddiness  Unsteadiness on feet     Problem List Patient Active Problem List   Diagnosis Date Noted  . Congestion of nasal sinus 08/11/2019  . Flu-like symptoms 02/08/2019  . Muscle pain 06/06/2018  . MDD (major depressive disorder), recurrent episode, severe ([place]) 09/25/2017  . Acute tension headache 05/15/2017  .  Generalized anxiety disorder 05/15/2017  . Colon polyp 12/07/2016  . Irritable  bowel syndrome 09/02/2016  . External hemorrhoid 09/02/2016  . Allergic rhinitis 04/29/2016  . Migraine 04/29/2016  . Vitamin D deficiency 11/17/2015  . Insomnia 07/27/2015  . Acne 03/09/2015  . Essential hypertension 02/27/2015  . Elevated serum creatinine 12/31/2014  . Fatigue 12/29/2014  . Medication management 12/29/2014  . Major depressive disorder, recurrent episode, moderate ([place]) 12/29/2014  . Chronic ITP (idiopathic thrombocytopenia) (HCC) 12/29/2014    Laksh Hinners, [place], PT 03/04/2020, 8:54 PM  [facility] [address] [place], [place], [address] Phone: [phone]   Fax:  [phone]  Name: Douglas Knight MRN: [id number] Date of Birth: 1986/10/30

## 2020-03-09 ENCOUNTER — Other Ambulatory Visit: Payer: Self-pay

## 2020-03-09 ENCOUNTER — Encounter: Payer: Self-pay | Admitting: Physical Therapy

## 2020-03-09 ENCOUNTER — Ambulatory Visit: Payer: BC Managed Care – PPO | Admitting: Physical Therapy

## 2020-03-09 DIAGNOSIS — R42 Dizziness and giddiness: Secondary | ICD-10-CM | POA: Diagnosis not present

## 2020-03-09 DIAGNOSIS — R2681 Unsteadiness on feet: Secondary | ICD-10-CM

## 2020-03-09 NOTE — Patient Instructions (Signed)
Upper Cervical Rotation    Rotate head slowly from side to side as if saying "no". Do not turn head completely to either side. Keep motion small. Repeat _2__ times per set. Do _1___ sets per session. Do _2_ sessions per day.  Use rolled towel - place around neck - criss cross hands; bottom hand stabilizes, top hand pulls to side   Hold 30 secs - to each side

## 2020-03-09 NOTE — Therapy (Signed)
[facility] [address] [place], [place], [address] Phone: [phone]   Fax:  [phone]  Physical Therapy Treatment  Patient Details  Name: Douglas Knight MRN: [id number] Date of Birth: [birth year] Referring Provider (PT): Anselm Jungling, PA-C   Encounter Date: 03/09/2020  PT End of Session - 03/09/20 2108    Visit Number  8    Number of Visits  10    Date for PT Re-Evaluation  03/20/20    Authorization Type  BCBS    Authorization - Visit Number  7    Authorization - Number of Visits  60    PT Start Time  8676    PT Stop Time  0931    PT Time Calculation (min)  44 min    Activity Tolerance  Patient tolerated treatment well    Behavior During Therapy  [facility] for tasks assessed/performed       Past Medical History:  Diagnosis Date  . Anal fissure   . Anxiety   . Bipolar disorder ([place])   . COVID-19   . Depression   . Hypertension   . IBS (irritable bowel syndrome)   . ITP (idiopathic thrombocytopenic purpura)   . Urticaria     Past Surgical History:  Procedure Laterality Date  . APPENDECTOMY  2003   [facility]    There were no vitals filed for this visit.  Subjective Assessment - 03/09/20 0848    Subjective  Pt states he had 2nd Covid vaccine on Friday and it caused increased tightness in cervical musc. and in bilateral upper traps; states he has been using BioFreeze and heat on these areas   pt has appt with Dr. Orie Rout at the [facility] for his migraines   Pertinent History  chronic ITP:  IBS:  h/o major depressive disorder; bipolar disorder; Covid; HTN    Patient Stated Goals  regain balance, resolve the dizziness, and regain stamina    Currently in Pain?  Yes    Pain Score  4     Pain Location  Back    Pain Orientation  Upper    Pain Descriptors / Indicators  Tightness    Pain Type  Chronic pain    Pain Onset  More than a month ago    Aggravating Factors    Covid vaccine on Friday increased    Pain Relieving Factors  heating pad, BioFreeze                       [facility] Adult PT Treatment/Exercise - 03/09/20 0001      Ambulation/Gait   Ambulation/Gait  Yes    Ambulation/Gait Assistance  5: Supervision    Ambulation Distance (Feet)  150 Feet    Gait Comments  making patterns with ball (Clockwise, counterclockwise, "V" and "+") patterns - tracking with eyes with head moving      TherEx; instructed in Mulligan's stretch with towel for cervical rotator stretch - pt performed 1 rep 20 sec hold to Rt and Lt sides Added this stretch to HEP    Balance Exercises - 03/09/20 2105      Balance Exercises: Standing   Rockerboard  Anterior/posterior;Head turns;EO;EC;10 reps    Marching  Foam/compliant surface;Head turns;Static   10 reps horizontal & vertical head turns   Other Standing Exercises  Pt performed gaze stabilization exercise on Balance Master - 2 targets - 1 on Rt and Lt sides - moving surround at 100%  with support not moving - 1" x 2 reps with pt looking at targets on each side   1" x 2 reps; support moving 20%: walls moving 100%   Other Standing Exercises Comments  Pt performed reaching in diagonal patterns "X" while standing on blue mat - turning 180 degrees 5 reps; turning 360 degrees 3 reps with CGA        PT Education - 03/09/20 2108    Education Details  added Mulligan's stretch with towel for cervical rotation stretch to HEP    Person(s) Educated  Patient    Methods  Explanation;Demonstration;Handout    Comprehension  Verbalized understanding;Returned demonstration       PT Short Term Goals - 03/09/20 2118      PT SHORT TERM GOAL #1   Title  Pt will improve DHI score from 50% to </ 30% to demonstrate improvement in vertigo.    Baseline  50% at initial;  52% on 03-02-20    Time  3    Period  Weeks    Status  Not Met    Target Date  02/28/20      PT SHORT TERM GOAL #2   Title  Independent in HEP for  vestibular exercises.    Baseline  met 03-02-20    Time  3    Period  Weeks    Status  Achieved    Target Date  02/28/20      PT SHORT TERM GOAL #3   Title  Perform SOT and establish goal as appropriate.    Baseline  SOT completed 02-10-20 -- LTG established    Time  3    Period  Weeks    Status  Achieved    Target Date  02/28/20        PT Long Term Goals - 03/09/20 2118      PT LONG TERM GOAL #1   Title  Improve DHI score from 50 to </= 20% to demo improvement in vertigo.    Baseline  50% on 02-03-20    Time  6    Period  Weeks    Status  New      PT LONG TERM GOAL #2   Title  Pt will subjectively report ability to perform visual scanning on cell phone and use of tablet with minimal (</= 2/10) dizziness for abiltiy to return to work activities.    Time  6    Period  Weeks    Status  New      PT LONG TERM GOAL #3   Title  Pt will amb. 31' with horizontal head turns with minimal c/o dizziness (</= 2/10) for ease with environmental scanning.    Time  6    Period  Weeks    Status  New      PT LONG TERM GOAL #4   Title  Increase vestibular input score on SOT to WNL's for improved vestibular function in maintaining balance.    Time  6    Period  Weeks    Status  New            Plan - 03/09/20 2109    Clinical Impression Statement  Pt reported increased headache more than dizziness with vestibular exercises; greatest symptoms provoked with gaze stabilization exercise on Balance Master with moving surround.  Added moving support at 20% today compared to fixed support during previous session.  Pt is progressing well towards goals.    Personal Factors and Comorbidities  Comorbidity 2;Profession;Behavior Pattern    Examination-Activity Limitations  Bend;Other;Stairs;Transfers;Locomotion Level   visual scanning with head turns   Examination-Participation Restrictions  Community Activity;Driving;Shop;Cleaning;Other   work   [employer] officer   Evolving/Moderate complexity    Rehab Potential  Good    PT Frequency  2x / week   2x/week for 3 weeks followed by 1x/wk for 3 wks   PT Duration  6 weeks    PT Treatment/Interventions  ADLs/Self Care Home Management;Therapeutic exercise;Vestibular;Neuromuscular re-education;Patient/family education;Balance training;Therapeutic activities;Gait training;Manual techniques;Passive range of motion;Dry needling    PT Next Visit Plan  Added dry needling to POC; cont DN and vestibular/gaze stabilization execises    PT Home Exercise Plan  x1 viewing, habituation (step and pivot turn), amb. with head turns; balance on foam;Mulligan's stretch added 03-09-20 to HEP    Consulted and Agree with Plan of Care  Patient       Patient will benefit from skilled therapeutic intervention in order to improve the following deficits and impairments:  Difficulty walking, Dizziness, Decreased activity tolerance, Decreased balance, Decreased range of motion, Decreased mobility, Impaired perceived functional ability, Pain  Visit Diagnosis: Dizziness and giddiness  Unsteadiness on feet     Problem List Patient Active Problem List   Diagnosis Date Noted  . Congestion of nasal sinus 08/11/2019  . Flu-like symptoms 02/08/2019  . Muscle pain 06/06/2018  . MDD (major depressive disorder), recurrent episode, severe ([place]) 09/25/2017  . Acute tension headache 05/15/2017  . Generalized anxiety disorder 05/15/2017  . Colon polyp 12/07/2016  . Irritable bowel syndrome 09/02/2016  . External hemorrhoid 09/02/2016  . Allergic rhinitis 04/29/2016  . Migraine 04/29/2016  . Vitamin D deficiency 11/17/2015  . Insomnia 07/27/2015  . Acne 03/09/2015  . Essential hypertension 02/27/2015  . Elevated serum creatinine 12/31/2014  . Fatigue 12/29/2014  . Medication management 12/29/2014  . Major depressive disorder, recurrent episode, moderate ([place]) 12/29/2014  . Chronic ITP (idiopathic thrombocytopenia) (HCC) 12/29/2014     Madden Garron, [place], PT 03/09/2020, 9:20 PM  [facility] [address] [place], [place], [address] Phone: [phone]   Fax:  [phone]  Name: Douglas Knight MRN: [id number] Date of Birth: [birth year]

## 2020-03-16 ENCOUNTER — Encounter: Payer: Self-pay | Admitting: Physical Therapy

## 2020-03-16 ENCOUNTER — Other Ambulatory Visit: Payer: Self-pay

## 2020-03-16 ENCOUNTER — Ambulatory Visit: Payer: BC Managed Care – PPO | Admitting: Physical Therapy

## 2020-03-16 DIAGNOSIS — R2681 Unsteadiness on feet: Secondary | ICD-10-CM | POA: Diagnosis not present

## 2020-03-16 DIAGNOSIS — R42 Dizziness and giddiness: Secondary | ICD-10-CM | POA: Diagnosis not present

## 2020-03-16 NOTE — Therapy (Signed)
[facility] [address] [place], [place], [address] Phone: [phone]   Fax:  [phone]  Physical Therapy Treatment  Patient Details  Name: Douglas Knight MRN: [id number] Date of Birth: [birth year] Referring Provider (PT): Anselm Jungling, PA-C   Encounter Date: 03/16/2020  PT End of Session - 03/16/20 1945    Visit Number  9    Number of Visits  10    Date for PT Re-Evaluation  03/20/20    Authorization Type  BCBS    Authorization - Visit Number  9    Authorization - Number of Visits  60    PT Start Time  0850    PT Stop Time  0930    PT Time Calculation (min)  40 min    Activity Tolerance  Patient tolerated treatment well    Behavior During Therapy  [facility] for tasks assessed/performed       Past Medical History:  Diagnosis Date  . Anal fissure   . Anxiety   . Bipolar disorder ([place])   . COVID-19   . Depression   . Hypertension   . IBS (irritable bowel syndrome)   . ITP (idiopathic thrombocytopenic purpura)   . Urticaria     Past Surgical History:  Procedure Laterality Date  . APPENDECTOMY  2003   [facility]    There were no vitals filed for this visit.  Subjective Assessment - 03/16/20 0852    Subjective  Pt states his PCP put him on Topamax - is going to ask him to stop it - says it is not working and he has been confused; says Saturday is a bad day - driving for 1 hour there and back and states there was a lot of people there which was a lot of stimulation - got sensory overload.  States he drove 3"-4" to [employer]' from his house - missed some turns, felt somewhat disoriented and decided he needed to go back home.  Headaches were bad driving.  Hasn't driven since then.   pt has appt with Dr. Orie Rout at the [facility] for his migraines   Pertinent History  chronic ITP:  IBS:  h/o major depressive disorder; bipolar disorder; Covid; HTN    Patient Stated Goals  regain  balance, resolve the dizziness, and regain stamina    Currently in Pain?  Yes    Pain Score  3     Pain Location  Neck    Pain Orientation  Right;Left    Pain Descriptors / Indicators  Discomfort    Pain Onset  More than a month ago                       [facility] Adult PT Treatment/Exercise - 03/16/20 0001      Ambulation/Gait   Ambulation/Gait  Yes    Ambulation/Gait Assistance  5: Supervision    Ambulation Distance (Feet)  40 Feet    Gait Pattern  Within Functional Limits    Ambulation Surface  Level;Indoor    Gait Comments  making patterns with ball (Clockwise, counterclockwise, "V" and "+") patterns - tracking with eyes with head moving;  pt amb. 115' with horizontal head turns with dizziness 2/10 intensity                              Balance Exercises - 03/16/20 1941      Balance Exercises: Standing  Rockerboard  Anterior/posterior;Head turns;EO;EC;10 reps    Gait with Head Turns  Forward;1 rep   100   Turning  Right;Left;Other reps (comment)   4 reps to each side; with abrupt stop and turn   Marching  Foam/compliant surface;Head turns;Static;Dynamic;Forwards;Retro;5 reps   30' forward/back   Other Standing Exercises  marching with EO and EC on incline - added horizontal head turns with EO; alternate stepping forward/back 5 reps each leg with EO and then with EC          PT Short Term Goals - 03/16/20 1946      PT SHORT TERM GOAL #1   Title  Pt will improve DHI score from 50% to </ 30% to demonstrate improvement in vertigo.    Baseline  50% at initial;  52% on 03-02-20    Time  3    Period  Weeks    Status  Not Met    Target Date  02/28/20      PT SHORT TERM GOAL #2   Title  Independent in HEP for vestibular exercises.    Baseline  met 03-02-20    Time  3    Period  Weeks    Status  Achieved    Target Date  02/28/20      PT SHORT TERM GOAL #3   Title  Perform SOT and establish goal as appropriate.    Baseline  SOT completed 02-10-20 -- LTG  established    Time  3    Period  Weeks    Status  Achieved    Target Date  02/28/20        PT Long Term Goals - 03/16/20 0856      PT LONG TERM GOAL #1   Title  Improve DHI score from 50 to </= 20% to demo improvement in vertigo.    Baseline  50% on 02-03-20; 52% on 03-02-20    Time  6    Period  Weeks    Status  On-going      PT LONG TERM GOAL #2   Title  Pt will subjectively report ability to perform visual scanning on cell phone and use of tablet with minimal (</= 2/10) dizziness for abiltiy to return to work activities.    Baseline  pt reports ability to perform visual scanning is much improved - met per pt report    Time  6    Period  Weeks    Status  Achieved      PT LONG TERM GOAL #3   Title  Pt will amb. 5' with horizontal head turns with minimal c/o dizziness (</= 2/10) for ease with environmental scanning.    Baseline  met 03-16-20    Time  6    Period  Weeks    Status  Achieved      PT LONG TERM GOAL #4   Title  Increase vestibular input score on SOT to WNL's for improved vestibular function in maintaining balance.    Time  6    Period  Weeks    Status  New            Plan - 03/16/20 1948    Clinical Impression Statement  Pt has met LTG #2 & 3;  LTG #1 is ongoing as not achieved at this time.  Goal #4 to be assessed next session and renewal to be completed for 4 additional weeks.  Pt continues to c/o headache more than true dizziness during vestibular rehab  exercises.    Personal Factors and Comorbidities  Comorbidity 2;Profession;Behavior Pattern    Examination-Activity Limitations  Bend;Other;Stairs;Transfers;Locomotion Level   visual scanning with head turns   Examination-Participation Restrictions  Community Activity;Driving;Shop;Cleaning;Other   work   [employer] officer  Evolving/Moderate complexity    Rehab Potential  Good    PT Frequency  2x / week   2x/week for 3 weeks followed by 1x/wk for 3 wks   PT Duration  6 weeks    PT  Treatment/Interventions  ADLs/Self Care Home Management;Therapeutic exercise;Vestibular;Neuromuscular re-education;Patient/family education;Balance training;Therapeutic activities;Gait training;Manual techniques;Passive range of motion;Dry needling    PT Next Visit Plan  do SOT next session - renew for 4 more weeks; cont DN and vestibular/gaze stabilization execises    PT Home Exercise Plan  x1 viewing, habituation (step and pivot turn), amb. with head turns; balance on foam;Mulligan's stretch added 03-09-20 to HEP    Consulted and Agree with Plan of Care  Patient       Patient will benefit from skilled therapeutic intervention in order to improve the following deficits and impairments:  Difficulty walking, Dizziness, Decreased activity tolerance, Decreased balance, Decreased range of motion, Decreased mobility, Impaired perceived functional ability, Pain  Visit Diagnosis: Dizziness and giddiness  Unsteadiness on feet     Problem List Patient Active Problem List   Diagnosis Date Noted  . Congestion of nasal sinus 08/11/2019  . Flu-like symptoms 02/08/2019  . Muscle pain 06/06/2018  . MDD (major depressive disorder), recurrent episode, severe ([place]) 09/25/2017  . Acute tension headache 05/15/2017  . Generalized anxiety disorder 05/15/2017  . Colon polyp 12/07/2016  . Irritable bowel syndrome 09/02/2016  . External hemorrhoid 09/02/2016  . Allergic rhinitis 04/29/2016  . Migraine 04/29/2016  . Vitamin D deficiency 11/17/2015  . Insomnia 07/27/2015  . Acne 03/09/2015  . Essential hypertension 02/27/2015  . Elevated serum creatinine 12/31/2014  . Fatigue 12/29/2014  . Medication management 12/29/2014  . Major depressive disorder, recurrent episode, moderate ([place]) 12/29/2014  . Chronic ITP (idiopathic thrombocytopenia) (HCC) 12/29/2014    Reizel Calzada, [place], PT 03/16/2020, 7:55 PM  [facility] [address] [place], [place], [address] Phone: [phone]   Fax:  [phone]  Name: Douglas Knight MRN: [id number] Date of Birth: 01/23/1986

## 2020-03-17 DIAGNOSIS — G4489 Other headache syndrome: Secondary | ICD-10-CM | POA: Diagnosis not present

## 2020-03-17 DIAGNOSIS — Z8616 Personal history of COVID-19: Secondary | ICD-10-CM | POA: Diagnosis not present

## 2020-03-23 ENCOUNTER — Encounter: Payer: Self-pay | Admitting: Physical Therapy

## 2020-03-23 ENCOUNTER — Ambulatory Visit: Payer: BC Managed Care – PPO | Admitting: Physical Therapy

## 2020-03-23 ENCOUNTER — Other Ambulatory Visit: Payer: Self-pay

## 2020-03-23 DIAGNOSIS — R42 Dizziness and giddiness: Secondary | ICD-10-CM | POA: Diagnosis not present

## 2020-03-23 DIAGNOSIS — R2681 Unsteadiness on feet: Secondary | ICD-10-CM

## 2020-03-24 NOTE — Therapy (Addendum)
[facility] [address] [place], [place], [address] Phone: [phone]   Fax:  [phone]  Physical Therapy Treatment  Patient Details  Name: Douglas Knight MRN: [id number] Date of Birth: 05-24-1986 Referring Provider (PT): Anselm Jungling, PA-C   Encounter Date: 03/23/2020  PT End of Session - 03/23/20 2045    Visit Number  10    Number of Visits  18    Date for PT Re-Evaluation  04/24/20    Authorization Type  [employer]    Authorization - Visit Number  10    Authorization - Number of Visits  60    PT Start Time  0850    PT Stop Time  0931    PT Time Calculation (min)  41 min    Activity Tolerance  Patient tolerated treatment well    Behavior During Therapy  [facility] for tasks assessed/performed       Past Medical History:  Diagnosis Date  . Anal fissure   . Anxiety   . Bipolar disorder ([place])   . COVID-19   . Depression   . Hypertension   . IBS (irritable bowel syndrome)   . ITP (idiopathic thrombocytopenic purpura)   . Urticaria     Past Surgical History:  Procedure Laterality Date  . APPENDECTOMY  2003   [facility]    There were no vitals filed for this visit.  Subjective Assessment - 03/23/20 0850    Subjective  Pt states he talked to his doctor last Tuesday and officially stopped the Topamax - states the weekend was a little rough with having stopped the medication; got some good sleep last night and feels better today.  Has been looking at different houses lately (for purchasing) and has been going up/down alot stairs in the different houses - has been very careful, especially when descending steps   pt has appt with Dr. Orie Rout at the [facility] for his migraines   Pertinent History  chronic ITP:  IBS:  h/o major depressive disorder; bipolar disorder; Covid; HTN    Patient Stated Goals  regain balance, resolve the dizziness, and regain stamina    Currently in Pain?   Other (Comment)   headache   Pain Score  3     Pain Location  Head    Pain Orientation  Right;Left    Pain Descriptors / Indicators  Headache    Pain Type  Chronic pain    Pain Onset  More than a month ago    Pain Frequency  Intermittent                            Balance Exercises - 03/23/20 2042      Balance Exercises: Standing   Rockerboard  Anterior/posterior;Head turns;EO;EC;10 reps    Turning  Right;Left;Other reps (comment)   4 reps to each side   Marching  Foam/compliant surface;Head turns;Static;Dynamic;Forwards;Retro;5 reps   30' forward/back   Other Standing Exercises  pt performed figure 8 iwith small medicine ball between legs - standing on blue mat - 180 degree turns x 3 reps to Rt side, 3 reps to Lt side;  fig. 8 with ball between legs then 360 degree turns 3 reps    Other Standing Exercises Comments  pt performed weight shifts on Bosu  inside // bars 5 reps; squats on Bosu with UE support on // bars with EO; standing on Bosu with minimal UE support -  with head turns horizontally & vertically       Pt performed jogging approx. 40' x 2 reps with SBA - c/o incr. Dizziness after this activity  Pt performed gaze stabilization activity on Balance Master - 2 targets in "X" pattern 1" each direction with moving surround and moving support   PT Short Term Goals - 03/24/20 1038      PT SHORT TERM GOAL #1   Title  Pt will improve DHI score from 50% to </ 30% to demonstrate improvement in vertigo.    Baseline  50% at initial;  52% on 03-02-20    Time  3    Period  Weeks    Status  Not Met    Target Date  02/28/20      PT SHORT TERM GOAL #2   Title  Independent in HEP for vestibular exercises.    Baseline  met 03-02-20    Time  3    Period  Weeks    Status  Achieved    Target Date  02/28/20      PT SHORT TERM GOAL #3   Title  Perform SOT and establish goal as appropriate.    Baseline  SOT completed 02-10-20 -- LTG established    Time  3    Period   Weeks    Status  Achieved    Target Date  02/28/20        PT Long Term Goals - 03/23/20 2051      PT LONG TERM GOAL #1   Title  Improve DHI score from 50 to </= 20% to demo improvement in vertigo.    Baseline  50% on 02-03-20; 52% on 03-02-20    Time  6    Period  Weeks    Status  On-going    Target Date  04/24/20      PT LONG TERM GOAL #2   Title  Pt will subjectively report ability to perform visual scanning on cell phone and use of tablet with minimal (</= 2/10) dizziness for abiltiy to return to work activities.    Baseline  pt reports ability to perform visual scanning is much improved - met per pt report    Time  6    Period  Weeks    Status  Achieved      PT LONG TERM GOAL #3   Title  Pt will amb. 19' with horizontal head turns with minimal c/o dizziness (</= 2/10) for ease with environmental scanning.    Baseline  met 03-16-20    Time  6    Period  Weeks    Status  Achieved      PT LONG TERM GOAL #4   Title  Increase vestibular input score on SOT to WNL's for improved vestibular function in maintaining balance.    Time  6    Period  Weeks    Status  On-going    Target Date  04/24/20      PT LONG TERM GOAL #5   Title  Pt will subjectively report at least 50% improvement in headaches with less upper trap tightness with use of dry needling treatment modality.    Time  4    Period  Weeks    Status  New    Target Date  04/24/20            Plan - 03/24/20 1051    Clinical Impression Statement  Pt is progressing with improving dizziness and balance on  compliant surfaces.  LTG #4 remains ongoing with SOT to be completed at time of D/C.  Dry needling has been performed for 1 session which pt reports has helped with upper trap discomfort.    Personal Factors and Comorbidities  Comorbidity 2;Profession;Behavior Pattern    Examination-Activity Limitations  Bend;Other;Stairs;Transfers;Locomotion Level   visual scanning with head turns   Examination-Participation  Restrictions  Community Activity;Driving;Shop;Cleaning;Other   work   [employer]  Evolving/Moderate complexity    Rehab Potential  Good    PT Frequency  2x / week   2x/week for 3 weeks followed by 1x/wk for 3 wks   PT Duration  4 weeks    PT Treatment/Interventions  ADLs/Self Care Home Management;Therapeutic exercise;Vestibular;Neuromuscular re-education;Patient/family education;Balance training;Therapeutic activities;Gait training;Manual techniques;Passive range of motion;Dry needling    PT Next Visit Plan  cont DN and vestibular/gaze stabilization execises    PT Home Exercise Plan  x1 viewing, habituation (step and pivot turn), amb. with head turns; balance on foam;Mulligan's stretch added 03-09-20 to HEP    Consulted and Agree with Plan of Care  Patient       Patient will benefit from skilled therapeutic intervention in order to improve the following deficits and impairments:  Difficulty walking, Dizziness, Decreased activity tolerance, Decreased balance, Decreased range of motion, Decreased mobility, Impaired perceived functional ability, Pain  Visit Diagnosis: Dizziness and giddiness  Unsteadiness on feet     Problem List Patient Active Problem List   Diagnosis Date Noted  . Congestion of nasal sinus 08/11/2019  . Flu-like symptoms 02/08/2019  . Muscle pain 06/06/2018  . MDD (major depressive disorder), recurrent episode, severe ([place]) 09/25/2017  . Acute tension headache 05/15/2017  . Generalized anxiety disorder 05/15/2017  . Colon polyp 12/07/2016  . Irritable bowel syndrome 09/02/2016  . External hemorrhoid 09/02/2016  . Allergic rhinitis 04/29/2016  . Migraine 04/29/2016  . Vitamin D deficiency 11/17/2015  . Insomnia 07/27/2015  . Acne 03/09/2015  . Essential hypertension 02/27/2015  . Elevated serum creatinine 12/31/2014  . Fatigue 12/29/2014  . Medication management 12/29/2014  . Major depressive disorder, recurrent episode, moderate  ([place]) 12/29/2014  . Chronic ITP (idiopathic thrombocytopenia) ([facility]) 12/29/2014    Rheannon Cerney, [place], PT 03/24/2020, 12:20 PM  [facility] [address] [place], [place], [address] Phone: [phone]   Fax:  [phone]  Name: Douglas Knight MRN: [id number] Date of Birth: 1986/03/29  Physician:  Anselm Jungling, PA-C   Physician Documentation Your signature is required to indicate approval of the treatment plan as stated above.  Please sign and either send electronically or make a copy of this report for your files and return this physician signed original.  Please mark one 1.__approve of plan   2. ___approve of plan with the followingconditions. ____________________________________________________________________________________________________________________________________________   ______________________                                                       _____________________ Physician Signature  Date    Faxed to MD for signature

## 2020-03-25 ENCOUNTER — Other Ambulatory Visit: Payer: Self-pay

## 2020-03-25 ENCOUNTER — Ambulatory Visit (INDEPENDENT_AMBULATORY_CARE_PROVIDER_SITE_OTHER): Payer: BC Managed Care – PPO | Admitting: Neurology

## 2020-03-25 ENCOUNTER — Encounter: Payer: Self-pay | Admitting: Neurology

## 2020-03-25 DIAGNOSIS — G4489 Other headache syndrome: Secondary | ICD-10-CM

## 2020-03-25 MED ORDER — NORTRIPTYLINE HCL 10 MG PO CAPS: ORAL_CAPSULE | ORAL | 3 refills | Status: DC

## 2020-03-25 NOTE — Progress Notes (Addendum)
Reason for visit: Right-sided tinnitus, headache, vertigo  Referring physician: Dr. Sofie Hartigan Douglas Knight is a 34 y.o. male  History of present illness:  Douglas Knight is a 34 year old right-handed white male with a history of bipolar disorder.  The patient has a pre-existing history of migraine headaches, but he will only have about 2 headaches a year on average.  Stress may increase his headaches.  The patient contracted the Covid virus in January 2021, he received a positive test on 24 January.  He began having fevers, cough, nausea.  The patient was coughing vigorously and within a week or 2 developed tinnitus involving the right ear.  The patient also had onset of dizziness associated with significant vertigo at that time.  He has also has had daily headaches that are in the right frontotemporal area occasionally going to the left frontotemporal region and to the right parietal and occipital area.  The patient may have some photophobia without phonophobia with the headache.  The patient reports that he also has neck stiffness and shoulder discomfort.  He is getting vestibular rehab through physical therapy and is getting dry needling on the neck and shoulders which is helpful.  The patient is on propranolol for blood pressure issues taking 20 mg twice daily.  He was given a trial on Topamax for his headaches but could not tolerate this medication.  He reports no numbness or weakness of the face, arms, legs.  He denies issues controlling the bowels or the bladder.  He does have some fatigue but this is improving.  He does have some blurring of vision but no loss of vision or flashing lights.  He reports that he has a lot of trouble with driving, he has not been able to work since the [employer] virus as his job requires a lot of driving.  He is on short-term disability now.  He comes to this office for an evaluation.  Dr. Polly Cobia from ENT has seen this gentleman, he felt that he could possibly have  a perilymphatic fistula from heavy coughing, audiometric testing was completely normal.  The right-sided tinnitus is persistent.  The patient reports no problem with increased headache with upright posture versus lying down, he does have increased headache with exertion.  Past Medical History:  Diagnosis Date  . Anal fissure   . Anxiety   . Bipolar 1 disorder ([place])   . Bipolar disorder ([place])   . COVID-19   . Depression   . Headache   . Hypertension   . IBS (irritable bowel syndrome)   . ITP (idiopathic thrombocytopenic purpura)   . Urticaria     Past Surgical History:  Procedure Laterality Date  . APPENDECTOMY  2003   [facility]    Family History  Problem Relation Age of Onset  . Diabetes Father   . Hypertension Father   . Colon polyps Father   . Colon polyps Mother   . Heart disease Maternal Grandfather   . Alzheimer's disease Paternal Grandmother   . Depression Paternal Grandfather   . Colon polyps Paternal Grandfather   . Colon cancer Paternal Aunt   . Asthma Neg Hx   . Allergic rhinitis Neg Hx   . Angioedema Neg Hx   . Eczema Neg Hx   . Urticaria Neg Hx     Social history:  reports that he has never smoked. He has never used smokeless tobacco. He reports current alcohol use. He reports that he does not use  drugs.  Medications:  Prior to Admission medications   Medication Sig Start Date End Date Taking? Authorizing Provider  hydrOXYzine (ATARAX/VISTARIL) 50 MG tablet Take 50 mg by mouth as directed.   Yes [provider]  lamoTRIgine (LAMICTAL) 200 MG tablet Take 200 mg by mouth 2 (two) times daily.   Yes [provider]  meclizine (ANTIVERT) 25 MG tablet Take 1 tablet (25 mg total) by mouth 3 (three) times daily as needed for dizziness. 01/04/20  Yes Melynda Ripple, MD  propranolol (INDERAL) 40 MG tablet Take 1 tablet (40 mg total) by mouth daily. 04/12/19  Yes Alveda Reasons, MD  Azelastine-Fluticasone [facility]) [phone]  MCG/ACT SUSP Use 1 spray per nostril 1-2 times daily for nasal congestion or drainage Patient not taking: Reported on 02/03/2020 09/06/18   Kennith Gain, MD  EPINEPHrine (AUVI-Q) 0.3 mg/0.3 mL IJ SOAJ injection Use as directed for severe allergic reaction 10/09/18   Kozlow, Donnamarie Poag, MD  fluticasone (FLONASE) 50 MCG/ACT nasal spray Place 2 sprays into both nostrils daily. Patient not taking: Reported on 02/03/2020 08/09/19   Wilber Oliphant, MD  ipratropium (ATROVENT) 0.06 % nasal spray Place 2 sprays into both nostrils 2 (two) times daily. Can go up to 4 times a day Patient not taking: Reported on 09/06/2018 11/09/17 01/04/20  Smiley Houseman, MD      Allergies  Allergen Reactions  . Sulfa Antibiotics Anaphylaxis, Hives and Other (See Comments)    Stomach pain  . Wellbutrin [Bupropion] Shortness Of Breath    ROS:  Out of a complete 14 system review of symptoms, the patient complains only of the following symptoms, and all other reviewed systems are negative.  Right-sided tinnitus Headache Neck stiffness  Blood pressure (!) 142/91, pulse 65, temperature 98.4 F (36.9 C), height 5\' 9"  (1.753 m), weight 185 lb (83.9 kg).  Physical Exam  General: The patient is alert and cooperative at the time of the examination.  Eyes: Pupils are equal, round, and reactive to light. Discs are flat bilaterally.  Neck: The neck is supple, no carotid bruits are noted.  Respiratory: The respiratory examination is clear.  Cardiovascular: The cardiovascular examination reveals a regular rate and rhythm, no obvious murmurs or rubs are noted.  Skin: Extremities are without significant edema.  Neurologic Exam  Mental status: The patient is alert and oriented x 3 at the time of the examination. The patient has apparent normal recent and remote memory, with an apparently normal attention span and concentration ability.  Cranial nerves: Facial symmetry is present. There is good sensation of  the face to pinprick and soft touch bilaterally. The strength of the facial muscles and the muscles to head turning and shoulder shrug are normal bilaterally. Speech is well enunciated, no aphasia or dysarthria is noted. Extraocular movements are full. Visual fields are full. The tongue is midline, and the patient has symmetric elevation of the soft palate. No obvious hearing deficits are noted.  Motor: The motor testing reveals 5 over 5 strength of all 4 extremities. Good symmetric motor tone is noted throughout.  Sensory: Sensory testing is intact to pinprick, soft touch, vibration sensation, and position sense on all 4 extremities. No evidence of extinction is noted.  Coordination: Cerebellar testing reveals good finger-nose-finger and heel-to-shin bilaterally.  Gait and station: Gait is normal. Tandem gait is normal. Romberg is negative. No drift is seen.  Reflexes: Deep tendon reflexes are symmetric and normal bilaterally. Toes are downgoing bilaterally.   Assessment/Plan:  1.  History of migraine headache  2.  History of Covid infection in January 2021  3.  Onset of headache, right-sided tinnitus, neck stiffness following Covid infection  The patient did note onset of vertigo, right-sided tinnitus, and headache with neck stiffness after a bout of vigorous coughing.  The patient will be considered for a possible spontaneous CSF leak but he denies any positional component to his headache.  He will be placed on nortriptyline for treatment of the neck stiffness and for migraine.  Migraine may cause vertigo.  The patient will follow up in 3 to 4 months.  He will have MRI of the brain with contrast.  A prior CT of the temporal bones was unremarkable.   Jill Alexanders MD 03/25/2020 11:09 AM  [facility] [address] [place], [place] [address]  Phone [phone] Fax [phone]

## 2020-03-25 NOTE — Patient Instructions (Signed)
We will start nortriptyline for the headache and dizziness.  Pamelor (nortriptyline) is an antidepressant medication that has many uses that may include headache, whiplash injuries, or for peripheral neuropathy pain. Side effects may include drowsiness, dry mouth, blurred vision, or constipation. As with any antidepressant medication, worsening depression may occur. If you had any significant side effects, please call our office. The full effects of this medication may take 7-10 days after starting the drug, or going up on the dose.

## 2020-04-01 ENCOUNTER — Encounter: Payer: Self-pay | Admitting: Physical Therapy

## 2020-04-01 ENCOUNTER — Other Ambulatory Visit: Payer: Self-pay

## 2020-04-01 ENCOUNTER — Ambulatory Visit: Payer: BC Managed Care – PPO | Attending: Pediatrics | Admitting: Physical Therapy

## 2020-04-01 DIAGNOSIS — R2681 Unsteadiness on feet: Secondary | ICD-10-CM

## 2020-04-01 DIAGNOSIS — R42 Dizziness and giddiness: Secondary | ICD-10-CM | POA: Diagnosis not present

## 2020-04-01 NOTE — Therapy (Signed)
[facility] [address] [place], [place], [address] Phone: [phone]   Fax:  [phone]  Physical Therapy Treatment  Patient Details  Name: Douglas Knight MRN: [id number] Date of Birth: 13-Oct-1986 Referring Provider (PT): Anselm Jungling, PA-C   Encounter Date: 04/01/2020  PT End of Session - 04/01/20 0909    Visit Number  11    Number of Visits  18    Date for PT Re-Evaluation  04/24/20    Authorization Type  BCBS    Authorization - Visit Number  11    Authorization - Number of Visits  60    PT Start Time  0805    PT Stop Time  0850    PT Time Calculation (min)  45 min    Activity Tolerance  Patient tolerated treatment well    Behavior During Therapy  [facility] for tasks assessed/performed       Past Medical History:  Diagnosis Date  . Anal fissure   . Anxiety   . Bipolar 1 disorder ([place])   . Bipolar disorder ([place])   . COVID-19   . Depression   . Headache   . Hypertension   . IBS (irritable bowel syndrome)   . ITP (idiopathic thrombocytopenic purpura)   . Urticaria     Past Surgical History:  Procedure Laterality Date  . APPENDECTOMY  2003   [facility]    There were no vitals filed for this visit.  Subjective Assessment - 04/01/20 0807    Subjective  Started on Pamelor for headaches - still having some side effects but it is better than Topamax.  Headaches are still pretty bad.  Dr. Jannifer Franklin would like to do an MRI with contrast to check for CSF leak.  Drove here today - 10 minute drive - headache is worse and ringing in ears is worse but no dizziness.  After dry needling pt had increased ROM to the R and that helped with vestibular exercises.   pt has appt with Dr. Orie Rout at the [facility] for his migraines   Pertinent History  chronic ITP:  IBS:  h/o major depressive disorder; bipolar disorder; Covid; HTN    Patient Stated Goals  regain balance, resolve the dizziness,  and regain stamina    Currently in Pain?  Yes    Pain Score  5     Pain Location  Head    Pain Orientation  Right    Pain Descriptors / Indicators  Headache    Pain Onset  More than a month ago                       [facility] Adult PT Treatment/Exercise - 04/01/20 0856      Therapeutic Activites    Therapeutic Activities  Other Therapeutic Activities    Other Therapeutic Activities  educated on migraine triggers and referral patterns of migraine headaches due to trigeminal nn involvement.        Exercises   Exercises  Neck      Neck Exercises: Supine   Neck Retraction  5 reps;5 secs    Neck Retraction Limitations  following TDN       Manual Therapy   Manual Therapy  Soft tissue mobilization;Joint mobilization;Manual Traction    Manual therapy comments  following TDN to improve C1-C2 ROM and decrease myofascial pain    Joint Mobilization  with lower cervical spine in full flexion performed upper cervical flexion and  rotation    Myofascial Release  suboccipital release x 5 minutes after TDN    Manual Traction  in combination with suboccipital release x 5 minutes following TDN       Trigger Point Dry Needling - 04/01/20 0854    Consent Given?  Yes    Muscles Treated Head and Neck  Upper trapezius;Levator scapulae    Dry Needling Comments  performed on L and R side with significant improvement in ROM; no real change in HA    Upper Trapezius Response  Twitch reponse elicited;Palpable increased muscle length    Levator Scapulae Response  Twitch response elicited;Palpable increased muscle length           PT Education - 04/01/20 0908    Education Details  see TA    Person(s) Educated  Patient    Methods  Explanation    Comprehension  Verbalized understanding       PT Short Term Goals - 03/24/20 1038      PT SHORT TERM GOAL #1   Title  Pt will improve DHI score from 50% to </ 30% to demonstrate improvement in vertigo.    Baseline  50% at initial;  52% on  03-02-20    Time  3    Period  Weeks    Status  Not Met    Target Date  02/28/20      PT SHORT TERM GOAL #2   Title  Independent in HEP for vestibular exercises.    Baseline  met 03-02-20    Time  3    Period  Weeks    Status  Achieved    Target Date  02/28/20      PT SHORT TERM GOAL #3   Title  Perform SOT and establish goal as appropriate.    Baseline  SOT completed 02-10-20 -- LTG established    Time  3    Period  Weeks    Status  Achieved    Target Date  02/28/20        PT Long Term Goals - 03/23/20 2051      PT LONG TERM GOAL #1   Title  Improve DHI score from 50 to </= 20% to demo improvement in vertigo.    Baseline  50% on 02-03-20; 52% on 03-02-20    Time  6    Period  Weeks    Status  On-going    Target Date  04/24/20      PT LONG TERM GOAL #2   Title  Pt will subjectively report ability to perform visual scanning on cell phone and use of tablet with minimal (</= 2/10) dizziness for abiltiy to return to work activities.    Baseline  pt reports ability to perform visual scanning is much improved - met per pt report    Time  6    Period  Weeks    Status  Achieved      PT LONG TERM GOAL #3   Title  Pt will amb. 23' with horizontal head turns with minimal c/o dizziness (</= 2/10) for ease with environmental scanning.    Baseline  met 03-16-20    Time  6    Period  Weeks    Status  Achieved      PT LONG TERM GOAL #4   Title  Increase vestibular input score on SOT to WNL's for improved vestibular function in maintaining balance.    Time  6    Period  Weeks  Status  On-going    Target Date  04/24/20      PT LONG TERM GOAL #5   Title  Pt will subjectively report at least 50% improvement in headaches with less upper trap tightness with use of dry needling treatment modality.    Time  4    Period  Weeks    Status  New    Target Date  04/24/20            Plan - 04/01/20 0909    Clinical Impression Statement  Continued to utilize trigger point dry needling  to bilat upper trap/levator muscles in combination with manual therapy and isometric strengthening to address increased muscle tension and myofascial pain to improve ROM for driving and vestibular exercises.  Pt reported slightly worsening of HA when lying supine that improved when returning to upright.  Pt demonstrated improved tolerance of TDN today and did not experience any dizziness with TDN or exercises.  Will continue to address in order to progress towards LTG.    Personal Factors and Comorbidities  Comorbidity 2;Profession;Behavior Pattern    Examination-Activity Limitations  Bend;Other;Stairs;Transfers;Locomotion Level   visual scanning with head turns   Examination-Participation Restrictions  Community Activity;Driving;Shop;Cleaning;Other   work   [employer] officer  Evolving/Moderate complexity    Rehab Potential  Good    PT Frequency  2x / week   2x/week for 3 weeks followed by 1x/wk for 3 wks   PT Duration  4 weeks    PT Treatment/Interventions  ADLs/Self Care Home Management;Therapeutic exercise;Vestibular;Neuromuscular re-education;Patient/family education;Balance training;Therapeutic activities;Gait training;Manual techniques;Passive range of motion;Dry needling    PT Next Visit Plan  ROM has improved with TDN; let me know if we need one more to address muscles in upper neck.  Continue to progress x1 viewing and habituation to visual stimuli    PT Home Exercise Plan  x1 viewing, habituation (step and pivot turn), amb. with head turns; balance on foam;Mulligan's stretch added 03-09-20 to HEP    Consulted and Agree with Plan of Care  Patient       Patient will benefit from skilled therapeutic intervention in order to improve the following deficits and impairments:  Difficulty walking, Dizziness, Decreased activity tolerance, Decreased balance, Decreased range of motion, Decreased mobility, Impaired perceived functional ability, Pain  Visit Diagnosis: Dizziness and  giddiness  Unsteadiness on feet     Problem List Patient Active Problem List   Diagnosis Date Noted  . Congestion of nasal sinus 08/11/2019  . Flu-like symptoms 02/08/2019  . Muscle pain 06/06/2018  . MDD (major depressive disorder), recurrent episode, severe ([place]) 09/25/2017  . Acute tension headache 05/15/2017  . Generalized anxiety disorder 05/15/2017  . Colon polyp 12/07/2016  . Irritable bowel syndrome 09/02/2016  . External hemorrhoid 09/02/2016  . Allergic rhinitis 04/29/2016  . Migraine 04/29/2016  . Vitamin D deficiency 11/17/2015  . Insomnia 07/27/2015  . Acne 03/09/2015  . Essential hypertension 02/27/2015  . Elevated serum creatinine 12/31/2014  . Fatigue 12/29/2014  . Medication management 12/29/2014  . Major depressive disorder, recurrent episode, moderate ([place]) 12/29/2014  . Chronic ITP (idiopathic thrombocytopenia) (HCC) 12/29/2014    Rico Junker, PT, DPT 04/01/20    9:14 AM    [facility] [address] [place], [place], [address] Phone: [phone]   Fax:  [phone]  Name: Douglas Knight MRN: [id number] Date of Birth: 09/12/1986

## 2020-04-06 ENCOUNTER — Ambulatory Visit: Payer: BC Managed Care – PPO | Admitting: Physical Therapy

## 2020-04-06 ENCOUNTER — Other Ambulatory Visit: Payer: Self-pay

## 2020-04-06 ENCOUNTER — Telehealth: Payer: Self-pay | Admitting: Neurology

## 2020-04-06 DIAGNOSIS — R42 Dizziness and giddiness: Secondary | ICD-10-CM

## 2020-04-06 DIAGNOSIS — R2681 Unsteadiness on feet: Secondary | ICD-10-CM

## 2020-04-06 NOTE — Telephone Encounter (Signed)
no to the covid questions MR Brain w/wo contrast Dr. Stephanie Acre Auth: [id number] (exp. 04/06/20 to 07/07/20. Patient is scheduled at [facility] for 04/07/20.

## 2020-04-06 NOTE — Telephone Encounter (Signed)
Douglas Knight from [facility] is asking for a call from [facility] re: the MRI. [employer] can be reached at [phone]

## 2020-04-06 NOTE — Telephone Encounter (Signed)
bcbs pending faxed notes

## 2020-04-07 ENCOUNTER — Ambulatory Visit: Payer: BC Managed Care – PPO

## 2020-04-07 ENCOUNTER — Encounter: Payer: Self-pay | Admitting: Physical Therapy

## 2020-04-07 DIAGNOSIS — G4489 Other headache syndrome: Secondary | ICD-10-CM

## 2020-04-07 MED ORDER — GADOBENATE DIMEGLUMINE 529 MG/ML IV SOLN
15.0000 mL | Freq: Once | INTRAVENOUS | Status: AC | PRN
  Administered 2020-04-07: 15 mL via INTRAVENOUS

## 2020-04-07 NOTE — Therapy (Signed)
[facility] [address] [place], [place], [address] Phone: [phone]   Fax:  [phone]  Physical Therapy Treatment  Patient Details  Name: Douglas Knight MRN: [id number] Date of Birth: December 02, 1985 Referring Provider (PT): Anselm Jungling, PA-C   Encounter Date: 04/06/2020  PT End of Session - 04/07/20 2051    Visit Number  12    Number of Visits  18    Date for PT Re-Evaluation  04/24/20    Authorization Type  BCBS    Authorization - Visit Number  12    Authorization - Number of Visits  60    PT Start Time  1601    PT Stop Time  0931    PT Time Calculation (min)  42 min    Activity Tolerance  Patient tolerated treatment well    Behavior During Therapy  [facility] for tasks assessed/performed       Past Medical History:  Diagnosis Date  . Anal fissure   . Anxiety   . Bipolar 1 disorder ([place])   . Bipolar disorder ([place])   . COVID-19   . Depression   . Headache   . Hypertension   . IBS (irritable bowel syndrome)   . ITP (idiopathic thrombocytopenic purpura)   . Urticaria     Past Surgical History:  Procedure Laterality Date  . APPENDECTOMY  2003   [facility]    There were no vitals filed for this visit.                         Balance Exercises - 04/07/20 2049      Balance Exercises: Standing   Rockerboard  Anterior/posterior;Head turns;EO;EC;10 reps    Turning  Right;Left;Other reps (comment)   4 reps to each side   Marching  Foam/compliant surface;Head turns;Static;Dynamic;Forwards;Retro;5 reps   30' forward/back   Other Standing Exercises  pt performed figure 8 with small medicine ball between legs - standing on blue mat - 180 degree turns x 3 reps to Rt side, 3 reps to Lt side;  fig. 8 with ball between legs then 360 degree turns 3 reps    Other Standing Exercises Comments  pt performed amb. tossing ball - straight up, tossing on Rt and Lt sides; amb.  backwards tossing ball approx. 35' x 2 reps       standing on blue mat with feet together, then in partial tandem stance - making circles clockwise, counterclockwise, "V" and "t" patterns 35 'x 2 reps each direction    PT Short Term Goals - 04/07/20 2101      PT SHORT TERM GOAL #1   Title  Pt will improve DHI score from 50% to </ 30% to demonstrate improvement in vertigo.    Baseline  50% at initial;  52% on 03-02-20    Time  3    Period  Weeks    Status  Not Met    Target Date  02/28/20      PT SHORT TERM GOAL #2   Title  Independent in HEP for vestibular exercises.    Baseline  met 03-02-20    Time  3    Period  Weeks    Status  Achieved    Target Date  02/28/20      PT SHORT TERM GOAL #3   Title  Perform SOT and establish goal as appropriate.    Baseline  SOT completed 02-10-20 -- LTG established  Time  3    Period  Weeks    Status  Achieved    Target Date  02/28/20        PT Long Term Goals - 04/07/20 2101      PT LONG TERM GOAL #1   Title  Improve DHI score from 50 to </= 20% to demo improvement in vertigo.    Baseline  50% on 02-03-20; 52% on 03-02-20    Time  6    Period  Weeks    Status  On-going      PT LONG TERM GOAL #2   Title  Pt will subjectively report ability to perform visual scanning on cell phone and use of tablet with minimal (</= 2/10) dizziness for abiltiy to return to work activities.    Baseline  pt reports ability to perform visual scanning is much improved - met per pt report    Time  6    Period  Weeks    Status  Achieved      PT LONG TERM GOAL #3   Title  Pt will amb. 41' with horizontal head turns with minimal c/o dizziness (</= 2/10) for ease with environmental scanning.    Baseline  met 03-16-20    Time  6    Period  Weeks    Status  Achieved      PT LONG TERM GOAL #4   Title  Increase vestibular input score on SOT to WNL's for improved vestibular function in maintaining balance.    Time  6    Period  Weeks    Status  On-going       PT LONG TERM GOAL #5   Title  Pt will subjectively report at least 50% improvement in headaches with less upper trap tightness with use of dry needling treatment modality.    Time  4    Period  Weeks    Status  New            Plan - 04/07/20 2052    Clinical Impression Statement  Pt reports visual/vestibular exercises increase his headache; pt able to take short rest breaks during therapy session to allow HA intensity to decrease and then able to resume activitiies.  Pt requests another dry needling session to address upper trap tightness.    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 with head turns; balance on foam;Mulligan's stretch added 03-09-20 to HEP    Consulted and Agree with Plan of Care  Patient       Patient will benefit from skilled therapeutic intervention in order to improve the following deficits and impairments:  Difficulty walking, Dizziness, Decreased activity tolerance, Decreased balance, Decreased range of motion, Decreased mobility, Impaired perceived functional  ability,  Pain  Visit Diagnosis: Dizziness and giddiness  Unsteadiness on feet     Problem List Patient Active Problem List   Diagnosis Date Noted  . Congestion of nasal sinus 08/11/2019  . Flu-like symptoms 02/08/2019  . Muscle pain 06/06/2018  . MDD (major depressive disorder), recurrent episode, severe ([place]) 09/25/2017  . Acute tension headache 05/15/2017  . Generalized anxiety disorder 05/15/2017  . Colon polyp 12/07/2016  . Irritable bowel syndrome 09/02/2016  . External hemorrhoid 09/02/2016  . Allergic rhinitis 04/29/2016  . Migraine 04/29/2016  . Vitamin D deficiency 11/17/2015  . Insomnia 07/27/2015  . Acne 03/09/2015  . Essential hypertension 02/27/2015  . Elevated serum creatinine 12/31/2014  . Fatigue 12/29/2014  . Medication management 12/29/2014  . Major depressive disorder, recurrent episode, moderate ([place]) 12/29/2014  . Chronic ITP (idiopathic thrombocytopenia) (HCC) 12/29/2014    Binta Statzer, [place], PT 04/07/2020, 9:05 PM  [facility] [address] [place], [place], [address] Phone: [phone]   Fax:  [phone]  Name: Douglas Knight MRN: [id number] Date of Birth: Nov 18, 1986

## 2020-04-09 ENCOUNTER — Telehealth: Payer: Self-pay | Admitting: Neurology

## 2020-04-09 MED ORDER — AIMOVIG 140 MG/ML ~~LOC~~ SOAJ: 140.0000 mg | SUBCUTANEOUS | 4 refills | Status: DC

## 2020-04-09 NOTE — Telephone Encounter (Signed)
I called the patient.  MRI of the brain was unremarkable, no evidence of low spinal fluid pressure.  The patient will call for any dose adjustments of his medication for headache.    MRI brain 04/08/20:  IMPRESSION:   Normal MRI brain (with and without).

## 2020-04-09 NOTE — Addendum Note (Signed)
Addended by: Kathrynn Ducking on: 04/09/2020 05:45 PM   Modules accepted: Orders

## 2020-04-09 NOTE — Telephone Encounter (Signed)
I called the patient.  He is not tolerating the nortriptyline as it is making him moody and depressed.  He could not tolerate Topamax, he is on propranolol, we will try Aimovig for the headache.

## 2020-04-09 NOTE — Telephone Encounter (Signed)
Pt called stating he would like to schedule a sooner apt as he is still having issues with his headaches. Advised pt message would be sent to MD for review as pt was recently seen by MD

## 2020-04-13 ENCOUNTER — Ambulatory Visit: Payer: BC Managed Care – PPO | Admitting: Physical Therapy

## 2020-04-13 ENCOUNTER — Other Ambulatory Visit: Payer: Self-pay

## 2020-04-13 DIAGNOSIS — R42 Dizziness and giddiness: Secondary | ICD-10-CM

## 2020-04-13 DIAGNOSIS — R2681 Unsteadiness on feet: Secondary | ICD-10-CM | POA: Diagnosis not present

## 2020-04-14 ENCOUNTER — Telehealth: Payer: Self-pay

## 2020-04-14 ENCOUNTER — Encounter: Payer: Self-pay | Admitting: Physical Therapy

## 2020-04-14 NOTE — Therapy (Signed)
[facility] [address] [place], [place], [address] Phone: [phone]   Fax:  [phone]  Physical Therapy Treatment  Patient Details  Name: Douglas Knight MRN: [id number] Date of Birth: [birth year] Referring Provider (PT): Anselm Jungling, PA-C   Encounter Date: 04/13/2020  PT End of Session - 04/14/20 2114    Visit Number  13    Number of Visits  18    Date for PT Re-Evaluation  04/24/20    Authorization Type  BCBS    Authorization - Visit Number  13    Authorization - Number of Visits  60    PT Start Time  9892    PT Stop Time  0931    PT Time Calculation (min)  44 min    Activity Tolerance  Patient tolerated treatment well    Behavior During Therapy  [facility] for tasks assessed/performed       Past Medical History:  Diagnosis Date  . Anal fissure   . Anxiety   . Bipolar 1 disorder ([place])   . Bipolar disorder ([place])   . COVID-19   . Depression   . Headache   . Hypertension   . IBS (irritable bowel syndrome)   . ITP (idiopathic thrombocytopenic purpura)   . Urticaria     Past Surgical History:  Procedure Laterality Date  . APPENDECTOMY  2003   [facility]    There were no vitals filed for this visit.            NeuroRe-ed:   standing on blue mat with feet together, then in partial tandem stance - making circles clockwise, counterclockwise, "V" and "t" patterns 35 'x 2 reps each direction    Pt performed crossovers front - on blue mat - 3 reps with 180 degree turns and then 3 reps with 360 degree turns  Balance master - looking at 2 targets on either side - 3 reps for 1" each with horizontal head turns with moving support and surround           Balance Exercises - 04/14/20 2113      Balance Exercises: Standing   Rockerboard  Anterior/posterior;Head turns;EO;EC;10 reps    Turning  Right;Left;Other reps (comment)   4 reps to each side   Marching   Foam/compliant surface;Head turns;Static;Dynamic;Forwards;Retro;5 reps   30' forward/back   Other Standing Exercises Comments  pt performed amb. tossing ball - straight up, tossing on Rt and Lt sides; amb. backwards tossing ball approx. 35' x 2 reps           PT Short Term Goals - 04/14/20 2119      PT SHORT TERM GOAL #1   Title  Pt will improve DHI score from 50% to </ 30% to demonstrate improvement in vertigo.    Baseline  50% at initial;  52% on 03-02-20    Time  3    Period  Weeks    Status  Not Met    Target Date  02/28/20      PT SHORT TERM GOAL #2   Title  Independent in HEP for vestibular exercises.    Baseline  met 03-02-20    Time  3    Period  Weeks    Status  Achieved    Target Date  02/28/20      PT SHORT TERM GOAL #3   Title  Perform SOT and establish goal as appropriate.    Baseline  SOT completed 02-10-20 --  LTG established    Time  3    Period  Weeks    Status  Achieved    Target Date  02/28/20        PT Long Term Goals - 04/14/20 2119      PT LONG TERM GOAL #1   Title  Improve DHI score from 50 to </= 20% to demo improvement in vertigo.    Baseline  50% on 02-03-20; 52% on 03-02-20    Time  6    Period  Weeks    Status  On-going      PT LONG TERM GOAL #2   Title  Pt will subjectively report ability to perform visual scanning on cell phone and use of tablet with minimal (</= 2/10) dizziness for abiltiy to return to work activities.    Baseline  pt reports ability to perform visual scanning is much improved - met per pt report    Time  6    Period  Weeks    Status  Achieved      PT LONG TERM GOAL #3   Title  Pt will amb. 15' with horizontal head turns with minimal c/o dizziness (</= 2/10) for ease with environmental scanning.    Baseline  met 03-16-20    Time  6    Period  Weeks    Status  Achieved      PT LONG TERM GOAL #4   Title  Increase vestibular input score on SOT to WNL's for improved vestibular function in maintaining balance.    Time  6     Period  Weeks    Status  On-going      PT LONG TERM GOAL #5   Title  Pt will subjectively report at least 50% improvement in headaches with less upper trap tightness with use of dry needling treatment modality.    Time  4    Period  Weeks    Status  New            Plan - 04/14/20 2115    Clinical Impression Statement  Pt continues to c/o increased headache with vestibular/gaze stabilization exercises and activities but no LOB occurred with dynamic gait activities; pt demonstrates static and dynamic balance WNL's.  Pt is progressing well towards goals.    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 with head turns; balance on foam;Mulligan's stretch added 03-09-20 to HEP    Consulted and Agree with Plan of Care  Patient       Patient will benefit from skilled therapeutic intervention in order to improve the following deficits and impairments:  Difficulty walking, Dizziness, Decreased activity tolerance, Decreased balance, Decreased range of motion, Decreased mobility, Impaired  perceived  functional ability, Pain  Visit Diagnosis: Dizziness and giddiness  Unsteadiness on feet     Problem List Patient Active Problem List   Diagnosis Date Noted  . Congestion of nasal sinus 08/11/2019  . Flu-like symptoms 02/08/2019  . Muscle pain 06/06/2018  . MDD (major depressive disorder), recurrent episode, severe ([place]) 09/25/2017  . Acute tension headache 05/15/2017  . Generalized anxiety disorder 05/15/2017  . Colon polyp 12/07/2016  . Irritable bowel syndrome 09/02/2016  . External hemorrhoid 09/02/2016  . Allergic rhinitis 04/29/2016  . Migraine 04/29/2016  . Vitamin D deficiency 11/17/2015  . Insomnia 07/27/2015  . Acne 03/09/2015  . Essential hypertension 02/27/2015  . Elevated serum creatinine 12/31/2014  . Fatigue 12/29/2014  . Medication management 12/29/2014  . Major depressive disorder, recurrent episode, moderate ([place]) 12/29/2014  . Chronic ITP (idiopathic thrombocytopenia) (HCC) 12/29/2014    Abdelrahman Nair, [place], PT 04/14/2020, 9:20 PM  [facility] [address] [place], [place], [address] Phone: [phone]   Fax:  [phone]  Name: Odie Edmonds MRN: [id number] Date of Birth: 03/13/1986

## 2020-04-14 NOTE — Telephone Encounter (Signed)
PA done on cover my meds for Aimovig. [employer] is reviewing your PA request. Typically an electronic response will be received within 72 hours. To check for an update later, open this request from your dashboard. You may close this dialog and return to your dashboard to perform other tasks

## 2020-04-20 ENCOUNTER — Other Ambulatory Visit: Payer: Self-pay

## 2020-04-20 ENCOUNTER — Ambulatory Visit: Payer: BC Managed Care – PPO | Admitting: Physical Therapy

## 2020-04-20 DIAGNOSIS — R2681 Unsteadiness on feet: Secondary | ICD-10-CM

## 2020-04-20 DIAGNOSIS — R42 Dizziness and giddiness: Secondary | ICD-10-CM

## 2020-04-20 NOTE — Telephone Encounter (Signed)
Revised. 

## 2020-04-20 NOTE — Telephone Encounter (Signed)
I called [employer] to give more information.I stated pt has daily headaches. The AImovig  was approve from 04/20/2020 to 10/21/2020.

## 2020-04-20 NOTE — Telephone Encounter (Addendum)
I receive denial letter that pts Aimovig was denied because of not having documented he has greater than 15 headaches per month or at least 8 migraines days per month for 3 months.i stated on the PA it did not ask how may migraines or headaches he has had.PA resubmitted.

## 2020-04-21 ENCOUNTER — Encounter: Payer: Self-pay | Admitting: Physical Therapy

## 2020-04-21 NOTE — Therapy (Signed)
[facility] [address] [place], [place], [address] Phone: [phone]   Fax:  [phone]  Physical Therapy Treatment  Patient Details  Name: Douglas Knight MRN: [id number] Date of Birth: 10/28/1986 Referring Provider (PT): Anselm Jungling, PA-C   Encounter Date: 04/20/2020  PT End of Session - 04/21/20 1932    Visit Number  14    Number of Visits  18    Date for PT Re-Evaluation  05/22/20    Authorization Type  BCBS    Authorization - Visit Number  14    Authorization - Number of Visits  60    PT Start Time  0848    PT Stop Time  0932    PT Time Calculation (min)  44 min    Activity Tolerance  Patient tolerated treatment well    Behavior During Therapy  [facility] for tasks assessed/performed       Past Medical History:  Diagnosis Date  . Anal fissure   . Anxiety   . Bipolar 1 disorder ([place])   . Bipolar disorder ([place])   . COVID-19   . Depression   . Headache   . Hypertension   . IBS (irritable bowel syndrome)   . ITP (idiopathic thrombocytopenic purpura)   . Urticaria     Past Surgical History:  Procedure Laterality Date  . APPENDECTOMY  2003   [facility]    There were no vitals filed for this visit.  Subjective Assessment - 04/21/20 1925    Subjective  Pt states he is doing better - is driving around town more but still [place] more lanes[place]    Currently in Pain?  Yes    Pain Score  4     Pain Location  Head   tightness in upper traps, esp on Rt side   Pain Orientation  Right    Pain Descriptors / Indicators  Tightness;Headache    Pain Type  Chronic pain    Pain Onset  More than a month ago    Pain Frequency  Intermittent       Self Care - discussed LTG's and progress; verbally reviewed HEP and discussed progression of x1 viewing exercise on patterned background - pt states he is currently using patterned background for this  exercise  Sensory Organization test - composite score   Somatosensory, visual and vestibular inputs WNL's  Pt performed amb. Approx. 40' tracking ball clockwise 2 reps, counterclockwise 2 reps, "V" pattern 40' x 1 rep, and "+" pattern 40' x 1 rep     Pt completed DHI - score 24%                  PT Short Term Goals - 04/20/20 0854      PT SHORT TERM GOAL #1   Title  Pt will improve DHI score from 50% to </ 30% to demonstrate improvement in vertigo.    Baseline  50% at initial;  52% on 03-02-20    Time  3    Period  Weeks    Status  Not Met    Target Date  02/28/20      PT SHORT TERM GOAL #2   Title  Independent in HEP for vestibular exercises.    Baseline  met 03-02-20    Time  3    Period  Weeks    Status  Achieved    Target Date  02/28/20  PT SHORT TERM GOAL #3   Title  Perform SOT and establish goal as appropriate.    Baseline  SOT completed 02-10-20 -- LTG established    Time  3    Period  Weeks    Status  Achieved    Target Date  02/28/20        PT Long Term Goals - 04/20/20 0854      PT LONG TERM GOAL #1   Title  Improve DHI score from 50 to </= 20% to demo improvement in vertigo.    Baseline  50% on 02-03-20; 52% on 03-02-20;  24% on 04-20-20    Time  6    Period  Weeks    Status  On-going    Target Date  05/22/20      PT LONG TERM GOAL #2   Title  Pt will subjectively report ability to perform visual scanning on cell phone and use of tablet with minimal (</= 2/10) dizziness for abiltiy to return to work activities.    Baseline  pt reports ability to perform visual scanning is much improved - met per pt report    Time  6    Period  Weeks    Status  Achieved      PT LONG TERM GOAL #3   Title  Pt will amb. 70' with horizontal head turns with minimal c/o dizziness (</= 2/10) for ease with environmental scanning.    Baseline  met 03-16-20    Time  6    Period  Weeks    Status  Achieved      PT LONG TERM GOAL #4   Title  Increase vestibular  input score on SOT to WNL's for improved vestibular function in maintaining balance.    Baseline  met on 04-20-20    Time  6    Period  Weeks    Status  Achieved      PT LONG TERM GOAL #5   Title  Pt will subjectively report at least 50% improvement in headaches with less upper trap tightness with use of dry needling treatment modality.    Baseline  Dry needling appt scheduled on 05-07-20    Time  4    Period  Weeks    Status  On-going    Target Date  05/22/20              Patient will benefit from skilled therapeutic intervention in order to improve the following deficits and impairments:  Difficulty walking, Dizziness, Decreased activity tolerance, Decreased balance, Decreased range of motion, Decreased mobility, Impaired perceived functional ability, Pain  Visit Diagnosis: Dizziness and giddiness  Unsteadiness on feet     Problem List Patient Active Problem List   Diagnosis Date Noted  . Congestion of nasal sinus 08/11/2019  . Flu-like symptoms 02/08/2019  . Muscle pain 06/06/2018  . MDD (major depressive disorder), recurrent episode, severe ([place]) 09/25/2017  . Acute tension headache 05/15/2017  . Generalized anxiety disorder 05/15/2017  . Colon polyp 12/07/2016  . Irritable bowel syndrome 09/02/2016  . External hemorrhoid 09/02/2016  . Allergic rhinitis 04/29/2016  . Migraine 04/29/2016  . Vitamin D deficiency 11/17/2015  . Insomnia 07/27/2015  . Acne 03/09/2015  . Essential hypertension 02/27/2015  . Elevated serum creatinine 12/31/2014  . Fatigue 12/29/2014  . Medication management 12/29/2014  . Major depressive disorder, recurrent episode, moderate ([place]) 12/29/2014  . Chronic ITP (idiopathic thrombocytopenia) (HCC) 12/29/2014    Douglas Knight, [place], PT 04/21/2020, 7:51 PM  [facility] [address] [place], [place], [address] Phone: [phone]   Fax:  [phone]  Name: Douglas Knight MRN: [id number] Date of Birth: 1986-11-14

## 2020-04-24 ENCOUNTER — Encounter: Payer: Self-pay | Admitting: Physical Therapy

## 2020-04-24 ENCOUNTER — Ambulatory Visit: Payer: BC Managed Care – PPO | Admitting: Physical Therapy

## 2020-04-24 ENCOUNTER — Other Ambulatory Visit: Payer: Self-pay

## 2020-04-24 DIAGNOSIS — R2681 Unsteadiness on feet: Secondary | ICD-10-CM | POA: Diagnosis not present

## 2020-04-24 DIAGNOSIS — R42 Dizziness and giddiness: Secondary | ICD-10-CM

## 2020-04-24 NOTE — Therapy (Signed)
[facility] [address] [place], [place], [address] Phone: [phone]   Fax:  [phone]  Physical Therapy Treatment  Patient Details  Name: Douglas Knight MRN: [id number] Date of Birth: 12-18-1985 Referring Provider (PT): Anselm Jungling, PA-C   Encounter Date: 04/24/2020  PT End of Session - 04/24/20 1217    Visit Number  15    Number of Visits  18    Date for PT Re-Evaluation  05/22/20    Authorization Type  BCBS    Authorization - Visit Number  15    Authorization - Number of Visits  60    PT Start Time  1016    PT Stop Time  1105    PT Time Calculation (min)  49 min    Activity Tolerance  Patient tolerated treatment well    Behavior During Therapy  [facility] for tasks assessed/performed       Past Medical History:  Diagnosis Date  . Anal fissure   . Anxiety   . Bipolar 1 disorder ([place])   . Bipolar disorder ([place])   . COVID-19   . Depression   . Headache   . Hypertension   . IBS (irritable bowel syndrome)   . ITP (idiopathic thrombocytopenic purpura)   . Urticaria     Past Surgical History:  Procedure Laterality Date  . APPENDECTOMY  2003   [facility]    There were no vitals filed for this visit.  Subjective Assessment - 04/24/20 1019    Subjective  It's getting better every day.  Driving about 30 minutes and does okay if he has a break in between trips.  Drove to a patient's house yesterday and had a headache afterwards; faster speeds are still hard.  HA and mm tension varies day to day but has less days of tension than before.    Pertinent History  chronic ITP:  IBS:  h/o major depressive disorder; bipolar disorder; Covid; HTN    Currently in Pain?  Yes    Pain Score  2     Pain Location  Head    Pain Orientation  Right    Pain Descriptors / Indicators  Headache    Pain Onset  More than a month ago                        [facility] Adult PT Treatment/Exercise  - 04/24/20 1211      Modalities   Modalities  Cryotherapy      Cryotherapy   Number Minutes Cryotherapy  8 Minutes    Cryotherapy Location  Cervical    Type of Cryotherapy  Ice massage      Manual Therapy   Manual Therapy  Myofascial release;Soft tissue mobilization;Passive ROM;Manual Traction    Manual therapy comments  After TDN to improve cervical ROM for driving    Soft tissue mobilization  STM To R upper trap after TDN and ice massage; performed along the length of the muscle     Myofascial Release  suboccipital release x 5 minutes after TDN    Passive ROM  R upper trapezius after dry needling to improve functional ROM    Manual Traction  in combination with suboccipital release x 5 minutes following TDN       Trigger Point Dry Needling - 04/24/20 1210    Consent Given?  Yes    Muscles Treated Head and Neck  Upper trapezius    Dry Needling  Comments  x3 on R side only today    Upper Trapezius Response  Twitch reponse elicited;Palpable increased muscle length           PT Education - 04/24/20 1227    Education Details  educated on continued graded exposure for driving further distances and busier environment.    Person(s) Educated  Patient    Methods  Explanation    Comprehension  Verbalized understanding       PT Short Term Goals - 04/20/20 0854      PT SHORT TERM GOAL #1   Title  Pt will improve DHI score from 50% to </ 30% to demonstrate improvement in vertigo.    Baseline  50% at initial;  52% on 03-02-20    Time  3    Period  Weeks    Status  Not Met    Target Date  02/28/20      PT SHORT TERM GOAL #2   Title  Independent in HEP for vestibular exercises.    Baseline  met 03-02-20    Time  3    Period  Weeks    Status  Achieved    Target Date  02/28/20      PT SHORT TERM GOAL #3   Title  Perform SOT and establish goal as appropriate.    Baseline  SOT completed 02-10-20 -- LTG established    Time  3    Period  Weeks    Status  Achieved    Target Date   02/28/20        PT Long Term Goals - 04/20/20 0854      PT LONG TERM GOAL #1   Title  Improve DHI score from 50 to </= 20% to demo improvement in vertigo.    Baseline  50% on 02-03-20; 52% on 03-02-20;  24% on 04-20-20    Time  6    Period  Weeks    Status  On-going    Target Date  05/22/20      PT LONG TERM GOAL #2   Title  Pt will subjectively report ability to perform visual scanning on cell phone and use of tablet with minimal (</= 2/10) dizziness for abiltiy to return to work activities.    Baseline  pt reports ability to perform visual scanning is much improved - met per pt report    Time  6    Period  Weeks    Status  Achieved      PT LONG TERM GOAL #3   Title  Pt will amb. 38' with horizontal head turns with minimal c/o dizziness (</= 2/10) for ease with environmental scanning.    Baseline  met 03-16-20    Time  6    Period  Weeks    Status  Achieved      PT LONG TERM GOAL #4   Title  Increase vestibular input score on SOT to WNL's for improved vestibular function in maintaining balance.    Baseline  met on 04-20-20    Time  6    Period  Weeks    Status  Achieved      PT LONG TERM GOAL #5   Title  Pt will subjectively report at least 50% improvement in headaches with less upper trap tightness with use of dry needling treatment modality.    Baseline  Dry needling appt scheduled on 05-07-20    Time  4    Period  Weeks  Status  On-going    Target Date  05/22/20            Plan - 04/24/20 1219    Clinical Impression Statement  Pt is making good progress but continues to have increased difficulty with driving and notes after >30 minutes of driving he has significant increase in shoulder tension/pain and headache.  Continued to address increase neck/shoulder tension and headaches with trigger point dry needling on R side, ice massage and manual therapy.  Pt reporting soreness after session and moving slightly en bloc but reporting decrease in headache.  Will continue  to progress towards LTG and educated pt on continued graded exposure to driving longer distances and busier roads.    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 with head turns; balance on foam;Mulligan's stretch added 03-09-20 to HEP    Consulted and Agree with Plan of Care  Patient       Patient will benefit from skilled therapeutic intervention in order to improve the following deficits and impairments:  Difficulty walking, Dizziness, Decreased activity tolerance, Decreased balance, Decreased range of motion, Decreased mobility, Impaired perceived functional ability, Pain  Visit Diagnosis: Dizziness and giddiness  Unsteadiness on feet     Problem List Patient Active Problem List   Diagnosis Date Noted  . Congestion of nasal sinus 08/11/2019  . Flu-like symptoms 02/08/2019  . Muscle pain 06/06/2018  . MDD (major depressive disorder), recurrent episode, severe ([place]) 09/25/2017  . Acute tension headache 05/15/2017  . Generalized anxiety disorder 05/15/2017  . Colon polyp 12/07/2016   . Irritable bowel syndrome 09/02/2016  . External hemorrhoid 09/02/2016  . Allergic rhinitis 04/29/2016  . Migraine 04/29/2016  . Vitamin D deficiency 11/17/2015  . Insomnia 07/27/2015  . Acne 03/09/2015  . Essential hypertension 02/27/2015  . Elevated serum creatinine 12/31/2014  . Fatigue 12/29/2014  . Medication management 12/29/2014  . Major depressive disorder, recurrent episode, moderate ([place]) 12/29/2014  . Chronic ITP (idiopathic thrombocytopenia) (HCC) 12/29/2014    Rico Junker, PT, DPT 04/24/20    12:32 PM    [facility] [address] [place], [place], [address] Phone: [phone]   Fax:  [phone]  Name: Douglas Knight MRN: [id number] Date of Birth: 20-Sep-1986

## 2020-05-04 ENCOUNTER — Other Ambulatory Visit: Payer: Self-pay

## 2020-05-04 ENCOUNTER — Ambulatory Visit: Payer: BC Managed Care – PPO | Attending: Pediatrics | Admitting: Physical Therapy

## 2020-05-04 DIAGNOSIS — R42 Dizziness and giddiness: Secondary | ICD-10-CM | POA: Diagnosis not present

## 2020-05-04 DIAGNOSIS — R2681 Unsteadiness on feet: Secondary | ICD-10-CM | POA: Diagnosis not present

## 2020-05-05 ENCOUNTER — Encounter: Payer: Self-pay | Admitting: Physical Therapy

## 2020-05-05 NOTE — Therapy (Signed)
[facility] [address] [place], [place], [address] Phone: [phone]   Fax:  [phone]  Physical Therapy Treatment  Patient Details  Name: Douglas Knight MRN: [id number] Date of Birth: 1985-12-31 Referring Provider (PT): Anselm Jungling, PA-C   Encounter Date: 05/04/2020  PT End of Session - 05/05/20 1407    Visit Number  16    Number of Visits  18    Date for PT Re-Evaluation  05/22/20    Authorization Type  BCBS    Authorization - Visit Number  16    Authorization - Number of Visits  60    PT Start Time  0933    PT Stop Time  1016    PT Time Calculation (min)  43 min    Activity Tolerance  Patient tolerated treatment well    Behavior During Therapy  [facility] for tasks assessed/performed       Past Medical History:  Diagnosis Date  . Anal fissure   . Anxiety   . Bipolar 1 disorder ([place])   . Bipolar disorder ([place])   . COVID-19   . Depression   . Headache   . Hypertension   . IBS (irritable bowel syndrome)   . ITP (idiopathic thrombocytopenic purpura)   . Urticaria     Past Surgical History:  Procedure Laterality Date  . APPENDECTOMY  2003   [facility]    There were no vitals filed for this visit.  Subjective Assessment - 05/04/20 0940    Subjective  Pt states he went to [place], [facility] Day weekend - felt nauseous with the prolonged travel trip; is now driving more to see patients - overall doing better    Pertinent History  chronic ITP:  IBS:  h/o major depressive disorder; bipolar disorder; Covid; HTN    Currently in Pain?  Yes    Pain Score  2     Pain Location  Neck    Pain Orientation  Right;Mid    Pain Descriptors / Indicators  Aching    Pain Type  Chronic pain    Pain Onset  More than a month ago    Pain Frequency  Intermittent                   NeuroRe-ed:     Standing on blue mat with feet together, then in partial tandem stance - making  circles clockwise, counterclockwise, "V" and "t" patterns 35 'x 2 reps each direction      Pt performed marching on incline with EO with head turns horizontal & vertical 5 reps each:  EC with no head turns, then with EC with horizontal and vertical head turns      Pt performed amb. Tossing ball straight up, then Rt side/Lt side 35'x 1 rep; making circles clockwise and counterclockwise patterns, "V" and "+" patterns 35' x 1 rep each direction         Balance Exercises - 05/05/20 0001      Balance Exercises: Standing   Standing Eyes Closed  Narrow base of support (BOS);Wide (BOA);Head turns;Foam/compliant surface    Rockerboard  Anterior/posterior;Head turns;EO;EC;10 reps   10 reps each direction   Turning  Right;Left;Other (comment)   2 reps to each side   Marching  Foam/compliant surface;Head turns;Dynamic;Forwards;Retro   2 reps on blue mat   Other Standing Exercises Comments  pt performed amb. tossing ball - straight up, tossing on Rt and Lt sides; amb. backwards tossing  ball approx. 35' x 2 reps           PT Short Term Goals - 05/05/20 1421      PT SHORT TERM GOAL #1   Title  Pt will improve DHI score from 50% to </ 30% to demonstrate improvement in vertigo.    Baseline  50% at initial;  52% on 03-02-20    Time  3    Period  Weeks    Status  Not Met    Target Date  02/28/20      PT SHORT TERM GOAL #2   Title  Independent in HEP for vestibular exercises.    Baseline  met 03-02-20    Time  3    Period  Weeks    Status  Achieved    Target Date  02/28/20      PT SHORT TERM GOAL #3   Title  Perform SOT and establish goal as appropriate.    Baseline  SOT completed 02-10-20 -- LTG established    Time  3    Period  Weeks    Status  Achieved    Target Date  02/28/20        PT Long Term Goals - 05/05/20 1421      PT LONG TERM GOAL #1   Title  Improve DHI score from 50 to </= 20% to demo improvement in vertigo.    Baseline  50% on 02-03-20; 52% on 03-02-20;   24% on 04-20-20    Time  6    Period  Weeks    Status  On-going      PT LONG TERM GOAL #2   Title  Pt will subjectively report ability to perform visual scanning on cell phone and use of tablet with minimal (</= 2/10) dizziness for abiltiy to return to work activities.    Baseline  pt reports ability to perform visual scanning is much improved - met per pt report    Time  6    Period  Weeks    Status  Achieved      PT LONG TERM GOAL #3   Title  Pt will amb. 60' with horizontal head turns with minimal c/o dizziness (</= 2/10) for ease with environmental scanning.    Baseline  met 03-16-20    Time  6    Period  Weeks    Status  Achieved      PT LONG TERM GOAL #4   Title  Increase vestibular input score on SOT to WNL's for improved vestibular function in maintaining balance.    Baseline  met on 04-20-20    Time  6    Period  Weeks    Status  Achieved      PT LONG TERM GOAL #5   Title  Pt will subjectively report at least 50% improvement in headaches with less upper trap tightness with use of dry needling treatment modality.    Baseline  Dry needling appt scheduled on 05-07-20    Time  4    Period  Weeks    Status  On-going            Plan - 05/05/20 1408    Clinical Impression Statement  Pt is progressing steadily towards goals, although continues to c/o headache and Rt cervical tightness with motion as with prolonged car rides.  Pt is improving with gaze stabilization exercises with minimal c/o dizziness.  Pt able to increase gait velocity with verbal cues, and able  to perform horizontal head turns for environmental scanning.    Personal Factors and Comorbidities  Comorbidity 2;Profession;Behavior Pattern    Examination-Activity Limitations  Bend;Other;Stairs;Transfers;Locomotion Level   visual scanning with head turns   Examination-Participation Restrictions  Community Activity;Driving;Shop;Cleaning;Other   work   [employer] officer  Evolving/Moderate  complexity    Rehab Potential  Good    PT Frequency  2x / week   2x/week for 3 weeks followed by 1x/wk for 3 wks   PT Duration  4 weeks    PT Treatment/Interventions  ADLs/Self Care Home Management;Therapeutic exercise;Vestibular;Neuromuscular re-education;Patient/family education;Balance training;Therapeutic activities;Gait training;Manual techniques;Passive range of motion;Dry needling    PT Next Visit Plan  dry needling 05-07-20; D/C 05-18-20 session    PT Home Exercise Plan  x1 viewing, habituation (step and pivot turn), amb. with head turns; balance on foam;Mulligan's stretch added 03-09-20 to HEP    Consulted and Agree with Plan of Care  Patient       Patient will benefit from skilled therapeutic intervention in order to improve the following deficits and impairments:  Difficulty walking, Dizziness, Decreased activity tolerance, Decreased balance, Decreased range of motion, Decreased mobility, Impaired perceived functional ability, Pain  Visit Diagnosis: Unsteadiness on feet  Dizziness and giddiness     Problem List Patient Active Problem List   Diagnosis Date Noted  . Congestion of nasal sinus 08/11/2019  . Flu-like symptoms 02/08/2019  . Muscle pain 06/06/2018  . MDD (major depressive disorder), recurrent episode, severe ([place]) 09/25/2017  . Acute tension headache 05/15/2017  . Generalized anxiety disorder 05/15/2017  . Colon polyp 12/07/2016  . Irritable bowel syndrome 09/02/2016  . External hemorrhoid 09/02/2016  . Allergic rhinitis 04/29/2016  . Migraine 04/29/2016  . Vitamin D deficiency 11/17/2015  . Insomnia 07/27/2015  . Acne 03/09/2015  . Essential hypertension 02/27/2015  . Elevated serum creatinine 12/31/2014  . Fatigue 12/29/2014  . Medication management 12/29/2014  . Major depressive disorder, recurrent episode, moderate ([place]) 12/29/2014  . Chronic ITP (idiopathic thrombocytopenia) (HCC) 12/29/2014    Denelle Capurro, [place], PT 05/05/2020, 2:23 PM  [facility] [address] [place], [place], [address] Phone: [phone]   Fax:  [phone]  Name: Douglas Knight MRN: [id number] Date of Birth: 03-Oct-1986

## 2020-05-07 ENCOUNTER — Encounter: Payer: Self-pay | Admitting: Physical Therapy

## 2020-05-07 ENCOUNTER — Other Ambulatory Visit: Payer: Self-pay

## 2020-05-07 ENCOUNTER — Ambulatory Visit: Payer: BC Managed Care – PPO | Admitting: Physical Therapy

## 2020-05-07 DIAGNOSIS — R2681 Unsteadiness on feet: Secondary | ICD-10-CM

## 2020-05-07 DIAGNOSIS — R42 Dizziness and giddiness: Secondary | ICD-10-CM | POA: Diagnosis not present
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
## 2020-05-14 ENCOUNTER — Emergency Department (HOSPITAL_COMMUNITY)
Admission: EM | Admit: 2020-05-14 | Discharge: 2020-05-15 | Disposition: A | Discharge status: Home / Self Care | Attending: Emergency Medicine | Admitting: Emergency Medicine

## 2020-05-14 ENCOUNTER — Emergency Department (HOSPITAL_COMMUNITY)

## 2020-05-14 ENCOUNTER — Encounter (HOSPITAL_COMMUNITY): Payer: Self-pay

## 2020-05-14 DIAGNOSIS — R1084 Generalized abdominal pain: Secondary | ICD-10-CM | POA: Diagnosis not present

## 2020-05-14 DIAGNOSIS — M25552 Pain in left hip: Secondary | ICD-10-CM | POA: Diagnosis not present

## 2020-05-14 DIAGNOSIS — M25551 Pain in right hip: Secondary | ICD-10-CM | POA: Diagnosis not present

## 2020-05-14 DIAGNOSIS — Z5321 Procedure and treatment not carried out due to patient leaving prior to being seen by health care provider: Secondary | ICD-10-CM | POA: Insufficient documentation

## 2020-05-14 LAB — CBC
HCT: 43.2 % (ref 39.0–52.0)
Hemoglobin: 15 g/dL (ref 13.0–17.0)
MCH: 32.1 pg (ref 26.0–34.0)
MCHC: 34.7 g/dL (ref 30.0–36.0)
MCV: 92.5 fL (ref 80.0–100.0)
Platelets: 146 10*3/uL — ABNORMAL LOW (ref 150–400)
RBC: 4.67 MIL/uL (ref 4.22–5.81)
RDW: 11.5 % (ref 11.5–15.5)
WBC: 7.8 10*3/uL (ref 4.0–10.5)
nRBC: 0 % (ref 0.0–0.2)

## 2020-05-14 LAB — BASIC METABOLIC PANEL
Anion gap: 11 (ref 5–15)
BUN: 14 mg/dL (ref 6–20)
CO2: 22 mmol/L (ref 22–32)
Calcium: 9.6 mg/dL (ref 8.9–10.3)
Chloride: 106 mmol/L (ref 98–111)
Creatinine, Ser: 1.48 mg/dL — ABNORMAL HIGH (ref 0.61–1.24)
GFR calc Af Amer: >60 mL/min (ref >60)
GFR calc non Af Amer: >60 mL/min (ref >60)
Glucose, Bld: 148 mg/dL — ABNORMAL HIGH (ref 70–99)
Potassium: 4.1 mmol/L (ref 3.5–5.1)
Sodium: 139 mmol/L (ref 135–145)
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
## 2020-05-15 DIAGNOSIS — M545 Low back pain: Secondary | ICD-10-CM | POA: Diagnosis not present

## 2020-05-15 DIAGNOSIS — S40929A Unspecified superficial injury of unspecified upper arm, initial encounter: Secondary | ICD-10-CM | POA: Diagnosis not present

## 2020-05-15 DIAGNOSIS — M25559 Pain in unspecified hip: Secondary | ICD-10-CM | POA: Diagnosis not present

## 2020-05-15 NOTE — ED Notes (Signed)
Pt stated they were leaving  

## 2020-05-15 NOTE — ED Notes (Signed)
Douglas Knight, [phone] would like a call when pt is in a room

## 2020-05-18 ENCOUNTER — Ambulatory Visit: Payer: BC Managed Care – PPO | Admitting: Physical Therapy

## 2020-06-24 ENCOUNTER — Ambulatory Visit (INDEPENDENT_AMBULATORY_CARE_PROVIDER_SITE_OTHER): Payer: BC Managed Care – PPO | Admitting: Neurology

## 2020-06-24 ENCOUNTER — Encounter: Payer: Self-pay | Admitting: Neurology

## 2020-06-24 VITALS — BP 134/88 | HR 80 | Ht 69.0 in | Wt 194.0 lb

## 2020-06-24 DIAGNOSIS — G43009 Migraine without aura, not intractable, without status migrainosus: Secondary | ICD-10-CM

## 2020-06-24 NOTE — Patient Instructions (Signed)
I am glad you are improving  Continue seeing your primary doctor See you back as needed

## 2020-06-24 NOTE — Progress Notes (Signed)
PATIENT: Douglas Knight DOB: [birth year]  REASON FOR VISIT: follow up HISTORY FROM: patient  HISTORY OF PRESENT ILLNESS: Today 06/24/20  Douglas Knight is a 34 year old male with history of bipolar disorder and migraine headaches.  His migraine headaches have been well controlled.  He had Covid infection in January 2021, had onset of headache, right-sided tinnitus, neck stiffness following Covid infection. MRI of the brain was unremarkable, no evidence of low spinal fluid pressure.  He could not tolerate nortriptyline or Topamax. Never started Aimovig, worried about side effects, and things started improving. The daily post-COVID headaches are 75% better. May have a few mild right sided temporal headache weekly, but no longer daily. Will take 800 mg ibuprofen if needed, but limits this. Completed vestibular rehab, dizziness much improved. 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 He presents today for evaluation unaccompanied.  HISTORY 03/25/2020 Dr. Jannifer Knight: Douglas Knight is a 34 year old right-handed white male with a history of bipolar disorder.  The patient has a pre-existing history of migraine headaches, but he will only have about 2 headaches a year on average.  Stress may increase his headaches.  The patient contracted the Covid virus in January 2021, he received a positive test on 24 January.  He began having fevers, cough, nausea.  The patient was coughing vigorously and within a week or 2 developed tinnitus involving the right ear.  The patient also had onset of dizziness associated with significant vertigo at that time.  He has also has had daily headaches that are in the right frontotemporal area occasionally going to the left frontotemporal region and to the right parietal and occipital area.  The patient may have some photophobia without phonophobia with the headache.  The patient reports that he also has neck stiffness and shoulder discomfort.  He is getting vestibular  rehab through physical therapy and is getting dry needling on the neck and shoulders which is helpful.  The patient is on propranolol for blood pressure issues taking 20 mg twice daily.  He was given a trial on Topamax for his headaches but could not tolerate this medication.  He reports no numbness or weakness of the face, arms, legs.  He denies issues controlling the bowels or the bladder.  He does have some fatigue but this is improving.  He does have some blurring of vision but no loss of vision or flashing lights.  He reports that he has a lot of trouble with driving, he has not been able to work since the [employer] virus as his job requires a lot of driving.  He is on short-term disability now.  He comes to this office for an evaluation.  Dr. Polly Knight from ENT has seen this gentleman, he felt that he could possibly have a perilymphatic fistula from heavy coughing, audiometric testing was completely normal.  The right-sided tinnitus is persistent.  The patient reports no problem with increased headache with upright posture versus lying down, he does have increased headache with exertion.   REVIEW OF SYSTEMS: Out of a complete 14 system review of symptoms, the patient complains only of the following symptoms, and all other reviewed systems are negative.  Headache  ALLERGIES: Allergies  Allergen Reactions  . Sulfa Antibiotics Anaphylaxis, Hives and Other (See Comments)    Stomach pain  . Wellbutrin [Bupropion] Shortness Of Breath    HOME MEDICATIONS: Outpatient Medications Prior to Visit  Medication Sig Dispense Refill  . hydrOXYzine (ATARAX/VISTARIL) 50 MG tablet Take 50 mg by  mouth as directed.    . lamoTRIgine (LAMICTAL) 200 MG tablet Take 200 mg by mouth 2 (two) times daily.    . meclizine (ANTIVERT) 25 MG tablet Take 1 tablet (25 mg total) by mouth 3 (three) times daily as needed for dizziness. 30 tablet 0  . propranolol (INDERAL) 40 MG tablet Take 1 tablet (40 mg total) by mouth daily. 90  tablet 1  . Azelastine-Fluticasone (DYMISTA) 137-50 MCG/ACT SUSP Use 1 spray per nostril 1-2 times daily for nasal congestion or drainage (Patient not taking: Reported on 02/03/2020) 1 Bottle 5  . EPINEPHrine (AUVI-Q) 0.3 mg/0.3 mL IJ SOAJ injection Use as directed for severe allergic reaction 2 Device 2  . Erenumab-aooe (AIMOVIG) 140 MG/ML SOAJ Inject 140 mg into the skin every 30 (thirty) days. 1.12 mg 4  . fluticasone (FLONASE) 50 MCG/ACT nasal spray Place 2 sprays into both nostrils daily. (Patient not taking: Reported on 02/03/2020) 16 g 0  . nortriptyline (PAMELOR) 10 MG capsule Take one capsule at night for one week, then take 2 capsules at night for one week, then take 3 capsules at night 90 capsule 3   No facility-administered medications prior to visit.    PAST MEDICAL HISTORY: Past Medical History:  Diagnosis Date  . Anal fissure   . Anxiety   . Bipolar 1 disorder ([place])   . Bipolar disorder ([place])   . COVID-19   . Depression   . Headache   . Hypertension   . IBS (irritable bowel syndrome)   . ITP (idiopathic thrombocytopenic purpura)   . Urticaria     PAST SURGICAL HISTORY: Past Surgical History:  Procedure Laterality Date  . APPENDECTOMY  2003   [facility]    FAMILY HISTORY: Family History  Problem Relation Age of Onset  . Diabetes Father   . Hypertension Father   . Colon polyps Father   . Colon polyps Mother   . Heart disease Maternal Grandfather   . Alzheimer's disease Paternal Grandmother   . Depression Paternal Grandfather   . Colon polyps Paternal Grandfather   . Colon cancer Paternal Aunt   . Asthma Neg Hx   . Allergic rhinitis Neg Hx   . Angioedema Neg Hx   . Eczema Neg Hx   . Urticaria Neg Hx     SOCIAL HISTORY: Social History   Socioeconomic History  . Marital status: Single    Spouse name: Not on file  . Number of children: 0  . Years of education: Not on file  . Highest education level: Not on file  Occupational History    . Occupation: Magazine features editor  Tobacco Use  . Smoking status: Never Smoker  . Smokeless tobacco: Never Used  Vaping Use  . Vaping Use: Never used  Substance and Sexual Activity  . Alcohol use: Yes    Comment: rarely  . Drug use: No  . Sexual activity: Not on file  Other Topics Concern  . Not on file  Social History Narrative   01/2015 Currently working on [employer] at [employer] Strain:   . Difficulty of Paying Living Expenses:   Food Insecurity:   . Worried About Charity fundraiser in the Last Year:   . Arboriculturist in the Last Year:   Transportation Needs:   . Film/video editor (Medical):   Marland Kitchen Lack of Transportation (Non-Medical):   Physical Activity:   . Days  of Exercise per Week:   . Minutes of Exercise per Session:   Stress:   . Feeling of Stress :   Social Connections:   . Frequency of Communication with Friends and Family:   . Frequency of Social Gatherings with Friends and Family:   . Attends Religious Services:   . Active Member of Clubs or Organizations:   . Attends Archivist Meetings:   Marland Kitchen Marital Status:   Intimate Partner Violence:   . Fear of Current or Ex-Partner:   . Emotionally Abused:   Marland Kitchen Physically Abused:   . Sexually Abused:    PHYSICAL EXAM  Vitals:   06/24/20 0950 06/24/20 0952  BP: (!) 162/91 (!) 134/88  Pulse: 80 80  Weight: 194 lb (88 kg)   Height: 5\' 9"  (1.753 m)    Body mass index is 28.65 kg/m.  Generalized: Well developed, in no acute distress   Neurological examination  Mentation: Alert oriented to time, place, history taking. Follows all commands speech and language fluent Cranial nerve II-XII: Pupils were equal round reactive to light. Extraocular movements were full, visual field were full on confrontational test. Facial sensation and strength were normal. Head turning and shoulder shrug  were normal and symmetric. Motor: The motor  testing reveals 5 over 5 strength of all 4 extremities. Good symmetric motor tone is noted throughout.  Sensory: Sensory testing is intact to soft touch on all 4 extremities. No evidence of extinction is noted.  Coordination: Cerebellar testing reveals good finger-nose-finger and heel-to-shin bilaterally.  Gait and station: Gait is normal. Reflexes: Deep tendon reflexes are symmetric and normal bilaterally.   DIAGNOSTIC DATA (LABS, IMAGING, TESTING) - I reviewed patient records, labs, notes, testing and imaging myself where available.  Lab Results  Component Value Date   WBC 7.8 05/14/2020   HGB 15.0 05/14/2020   HCT 43.2 05/14/2020   MCV 92.5 05/14/2020   PLT 146 (L) 05/14/2020      Component Value Date/Time   NA 139 05/14/2020 1719   NA 145 (H) 06/06/2018 1021   K 4.1 05/14/2020 1719   CL 106 05/14/2020 1719   CO2 22 05/14/2020 1719   GLUCOSE 148 (H) 05/14/2020 1719   BUN 14 05/14/2020 1719   BUN 16 06/06/2018 1021   CREATININE 1.48 (H) 05/14/2020 1719   CREATININE 1.12 01/13/2016 0931   CALCIUM 9.6 05/14/2020 1719   PROT 7.3 09/26/2017 0645   PROT 7.0 06/23/2017 1207   ALBUMIN 4.4 09/26/2017 0645   ALBUMIN 4.8 06/23/2017 1207   AST 18 09/26/2017 0645   ALT 21 09/26/2017 0645   ALKPHOS 84 09/26/2017 0645   BILITOT 0.9 09/26/2017 0645   BILITOT 0.5 06/23/2017 1207   GFRNONAA >60 05/14/2020 1719   GFRAA >60 05/14/2020 1719   Lab Results  Component Value Date   CHOL 137 09/26/2017   HDL 60 09/26/2017   LDLCALC 53 09/26/2017   TRIG 121 09/26/2017   CHOLHDL 2.3 09/26/2017   Lab Results  Component Value Date   HGBA1C 4.8 09/26/2017   No results found for: [id number] Lab Results  Component Value Date   TSH 2.482 09/26/2017    ASSESSMENT AND PLAN 34 y.o. year old male  has a past medical history of Anal fissure, Anxiety, Bipolar 1 disorder ([place]), Bipolar disorder ([place]), COVID-19, Depression, Headache, Hypertension, IBS (irritable bowel syndrome), ITP (idiopathic  thrombocytopenic purpura), and Urticaria. here with:  1.  History of migraine headache 2.  History of Covid infection in January 2021  3.  Onset of headache, right-sided tinnitus, neck stiffness following Covid infection  His symptoms have significantly improved, is 75% better. He never started the Aimovig, no longer feels is needed. If headaches return, we could offer CGRP in the future. He could not tolerate nortriptyline or Topamax. He will continue follow-up with his PCP, follow-up here on an as-needed basis.  I spent 20 minutes of face-to-face and non-face-to-face time with patient.  This included previsit chart review, lab review, study review, order entry, electronic health record documentation, patient education.  Butler Denmark, AGNP-C, DNP 06/24/2020, 10:31 AM [facility] [address] [place], [place] [address] [phone]

## 2020-06-25 DIAGNOSIS — R202 Paresthesia of skin: Secondary | ICD-10-CM | POA: Diagnosis not present

## 2020-06-25 DIAGNOSIS — R1032 Left lower quadrant pain: Secondary | ICD-10-CM | POA: Diagnosis not present

## 2020-06-26 NOTE — Progress Notes (Signed)
I have read the note, and I agree with the clinical assessment and plan.  Kacy Conely K Rodrecus Belsky   

## 2020-12-12 IMAGING — CT CT TEMPORAL BONES W/O CM
2 of 7 series · 10 of 40 positions shown, 12 images · non-contrast
Comparison: Head CT 05/16/2017

CLINICAL DATA: Right-sided tinnitus

EXAM:
CT TEMPORAL BONES WITHOUT CONTRAST
TECHNIQUE: Axial and coronal plane CT imaging of the petrous temporal bones was
performed with thin-collimation image reconstruction. No intravenous
contrast was administered. Multiplanar CT image reconstructions were
also generated.

[Series 3: temporal bones 0.60 hr60 ax bone · axial · 0.47mm/px · z∈[-563,-507]mm · 7 of 125 slices shown, 9 images]
[im 16/125  brain]
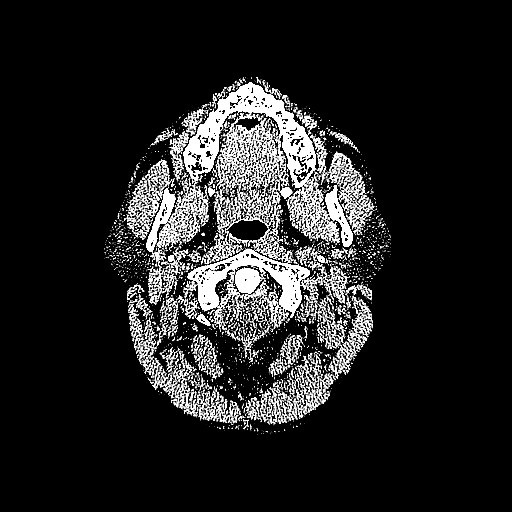
[im 16/125  bone]
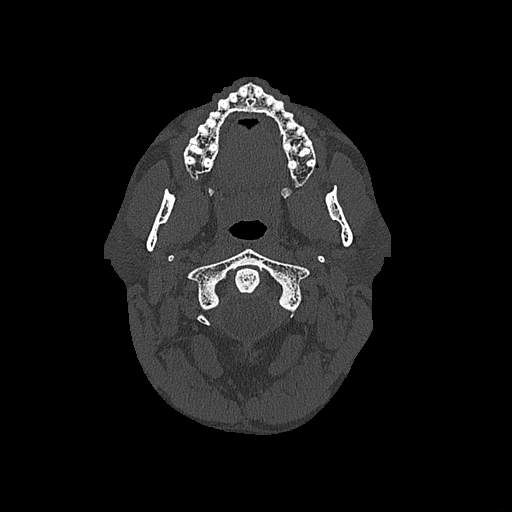
[im 32/125  bone]
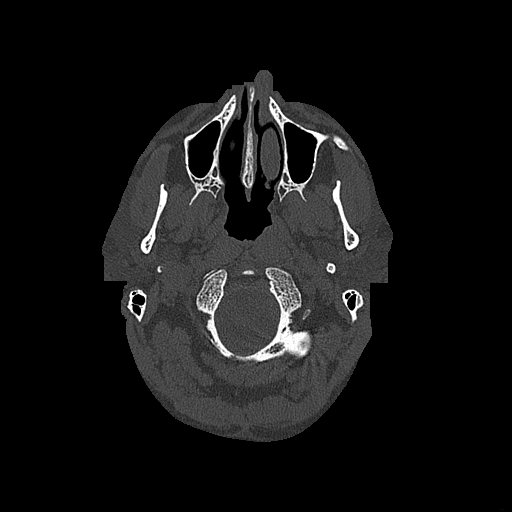
[im 47/125  bone]
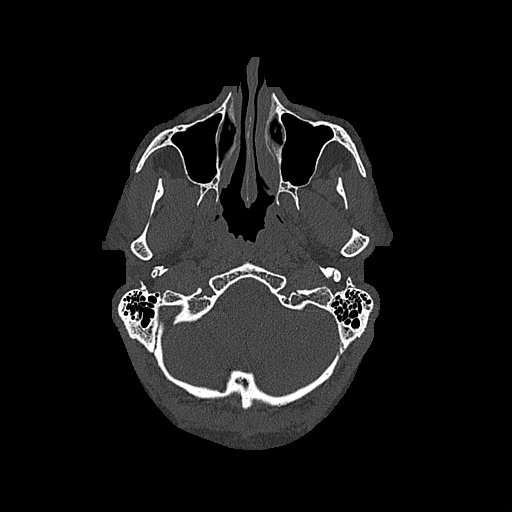
[im 63/125  bone]
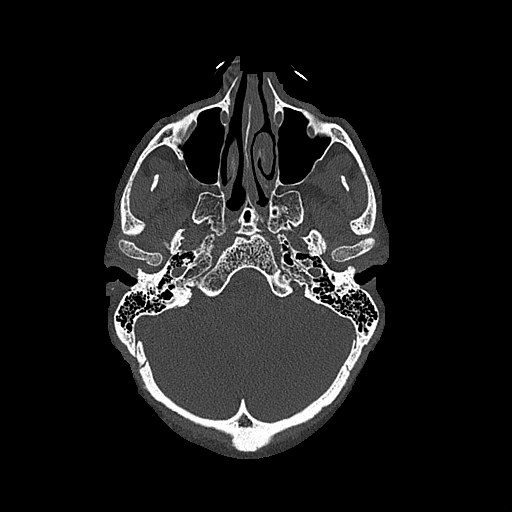
[im 78/125  brain]
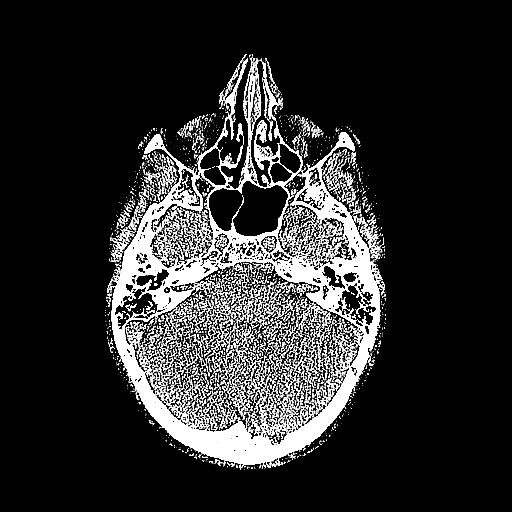
[im 78/125  bone]
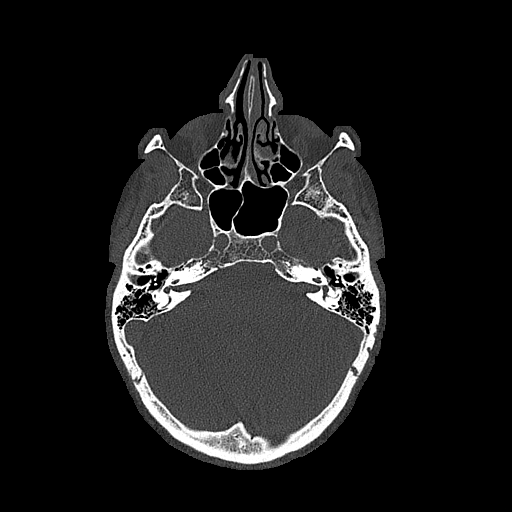
[im 94/125  bone]
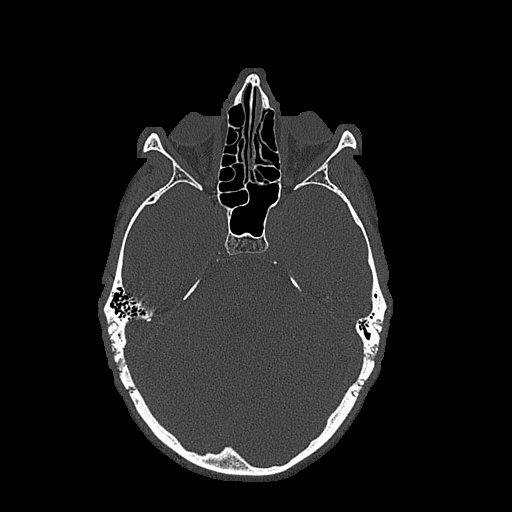
[im 109/125  bone]
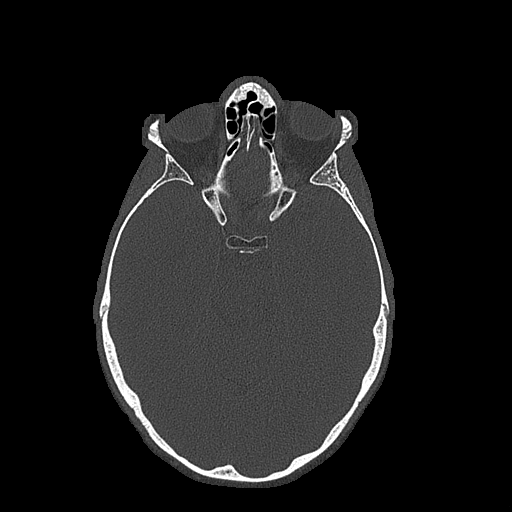

[Series 5: temporal bones 0.80 hr60 cor soft · coronal · 0.15mm/px · 3 of 404 slices shown]
[im 81/404  bone]
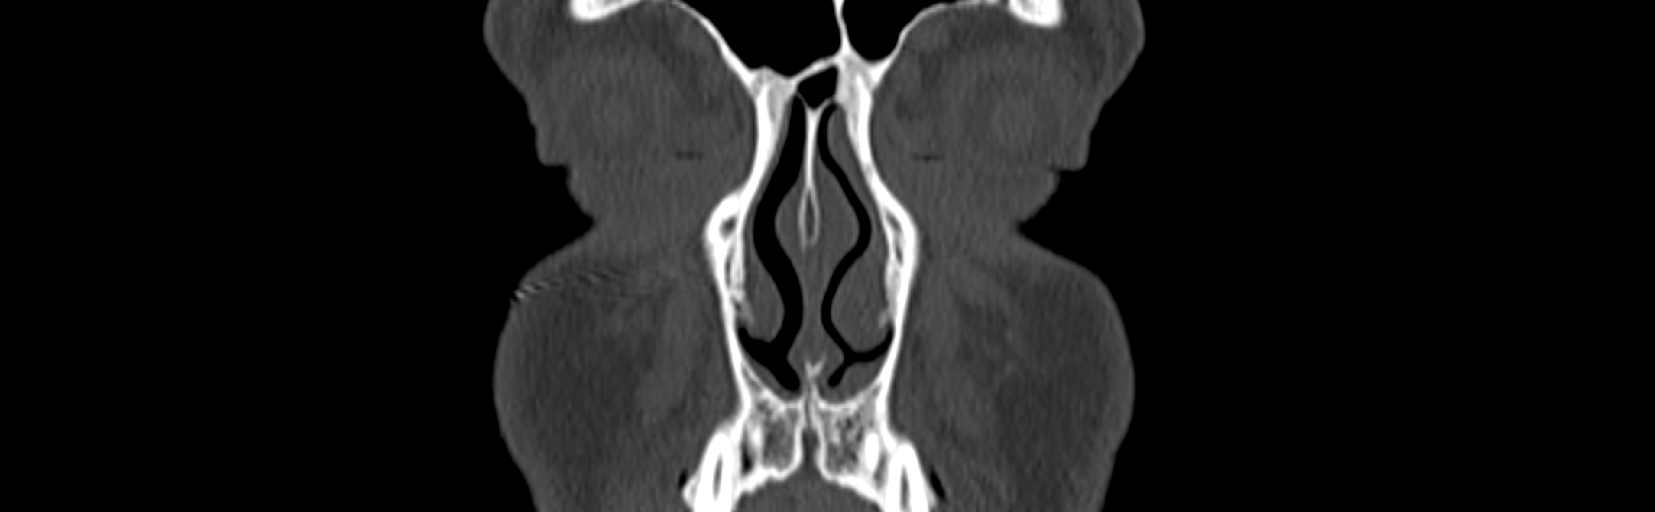
[im 162/404  bone]
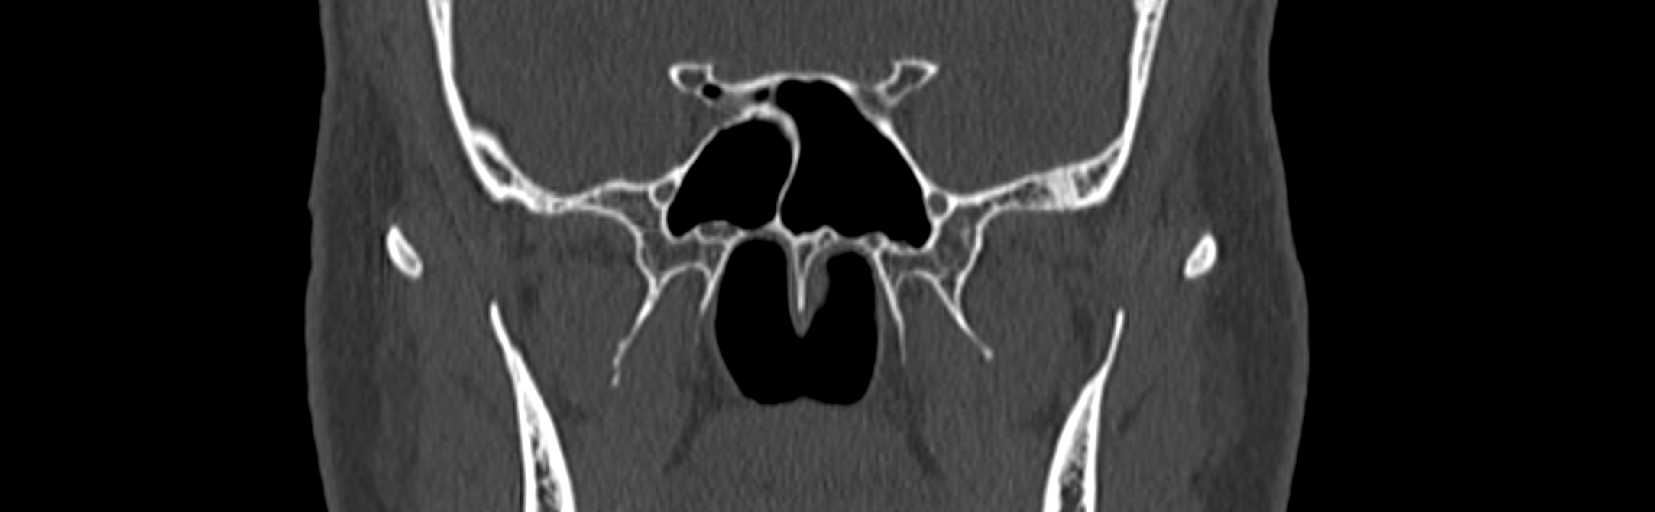
[im 242/404  bone]
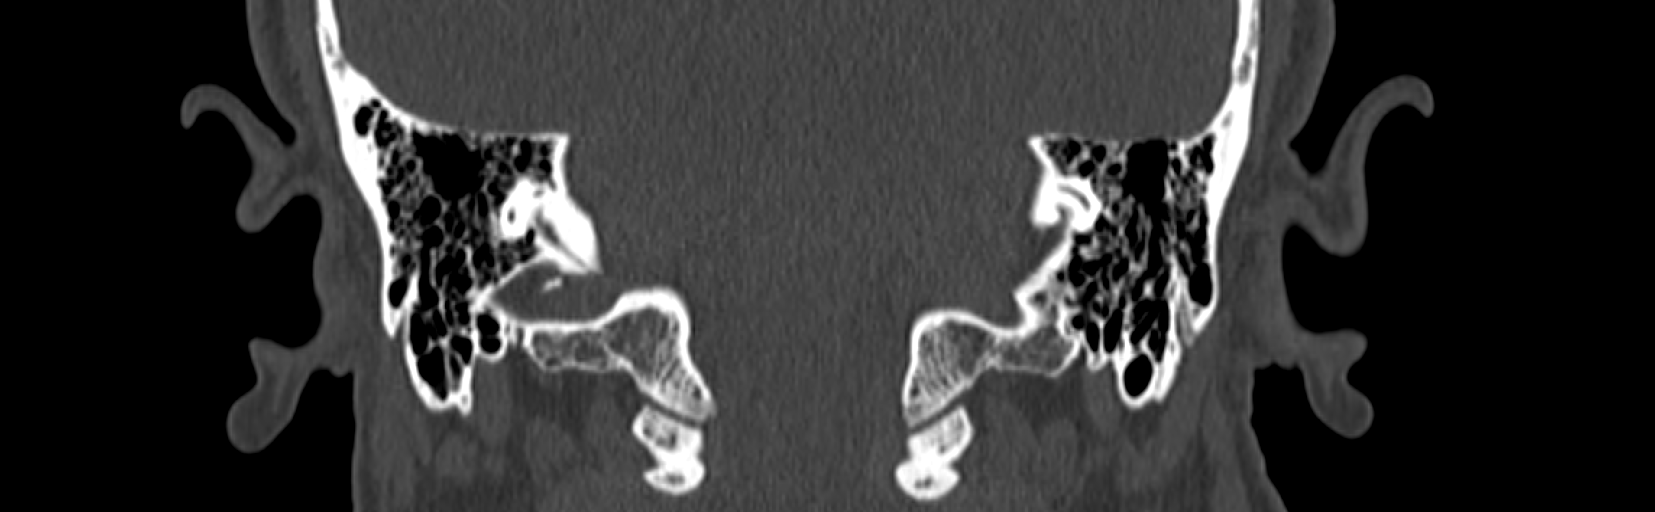

[10 of 40 positions shown; findings below may reference images not displayed]

FINDINGS: Right temporal bone: The Bino and external auditory canal are
unremarkable. The tympanic membrane is thin. The ossicles are
normally formed and aligned. Mastoid and middle ear is well
pneumatized and aerated. The labyrinthine structures appear normally
formed and bony covered. Internal auditory canal is normal in size.
The vestibular aqueduct is normal in size. The carotid and sigmoid
sinus are bone covered. The right sigmoid sinus is dominant.
Unremarkable facial nerve canal.

Left temporal bone: The Bino and external auditory canal are
unremarkable. The tympanic membrane is thin. The ossicles are
normally formed and aligned. Mastoid and middle ear is well
pneumatized and aerated. The labyrinthine structures appear normally
formed and bony covered. Internal auditory canal is normal in size.
The vestibular aqueduct is normal in size. The carotid and sigmoid
sinus are bone covered. Unremarkable facial nerve canal.
IMPRESSION: Negative temporal bones CT.

## 2021-01-26 ENCOUNTER — Encounter: Payer: Self-pay | Admitting: Gastroenterology
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
## 2021-04-02 ENCOUNTER — Other Ambulatory Visit: Payer: Self-pay

## 2021-04-02 ENCOUNTER — Ambulatory Visit (AMBULATORY_SURGERY_CENTER): Payer: Self-pay | Admitting: *Deleted

## 2021-04-02 VITALS — Ht 69.0 in | Wt 185.0 lb

## 2021-04-02 DIAGNOSIS — Z860100 Personal history of colon polyps, unspecified: Secondary | ICD-10-CM

## 2021-04-02 MED ORDER — SUPREP BOWEL PREP KIT 17.5-3.13-1.6 GM/177ML PO SOLN: 1.0000 | Freq: Once | ORAL | 0 refills | Status: AC

## 2021-04-02 NOTE — Progress Notes (Signed)
No egg or soy allergy known to patient  No issues with past sedation with any surgeries or procedures Patient denies ever being told they had issues or difficulty with intubation  No FH of Malignant Hyperthermia No diet pills per patient No home 02 use per patient  No blood thinners per patient  Pt denies issues with constipation  No A fib or A flutter  EMMI video to pt or via [place] 19 guidelines implemented in PV today with Pt and RN  Pt is fully vaccinated  for Covid - he is also a long haul Covid pt   Due to the COVID-19 pandemic we are asking patients to follow certain guidelines.  Pt aware of COVID protocols and [facility] guidelines

## 2021-04-16 ENCOUNTER — Other Ambulatory Visit: Payer: Self-pay

## 2021-04-16 ENCOUNTER — Ambulatory Visit (AMBULATORY_SURGERY_CENTER): Payer: 59 | Admitting: Gastroenterology

## 2021-04-16 ENCOUNTER — Encounter: Payer: Self-pay | Admitting: Gastroenterology

## 2021-04-16 VITALS — BP 115/82 | HR 68 | Temp 97.7°F | Resp 15 | Ht 69.0 in | Wt 185.0 lb

## 2021-04-16 DIAGNOSIS — D122 Benign neoplasm of ascending colon: Secondary | ICD-10-CM | POA: Diagnosis not present

## 2021-04-16 DIAGNOSIS — Z860100 Personal history of colon polyps, unspecified: Secondary | ICD-10-CM

## 2021-04-16 DIAGNOSIS — D125 Benign neoplasm of sigmoid colon: Secondary | ICD-10-CM | POA: Diagnosis not present

## 2021-04-16 DIAGNOSIS — D123 Benign neoplasm of transverse colon: Secondary | ICD-10-CM

## 2021-04-16 MED ORDER — SODIUM CHLORIDE 0.9 % IV SOLN: 500.0000 mL | Freq: Once | INTRAVENOUS | Status: AC

## 2021-04-16 NOTE — Progress Notes (Signed)
Called to room to assist during endoscopic procedure.  Patient ID and intended procedure confirmed with present staff. Received instructions for my participation in the procedure from the performing physician.  

## 2021-04-16 NOTE — Progress Notes (Signed)
Medical history reviewed with no changes noted. VS assessed by N.C 

## 2021-04-16 NOTE — Progress Notes (Signed)
A/ox3, pleased with MAC, report to RN 

## 2021-04-16 NOTE — Op Note (Signed)
[facility] Patient Name: Douglas Knight Procedure Date: 04/16/2021 9:13 AM MRN: [id number] Endoscopist: Sherilyn Cooter L. Myrtie Neither , MD Age: 35 Referring MD:  Date of Birth: 05-07-1986 Gender: Male Account #: [id number] Procedure:                Colonoscopy Indications:              Surveillance: Personal history of adenomatous                            polyps on last colonoscopy > 3 years ago (67mm TA                            ;Jan 2018) Medicines:                Monitored Anesthesia Care Procedure:                Pre-Anesthesia Assessment:                           - Prior to the procedure, a History and Physical                            was performed, and patient medications and                            allergies were reviewed. The patient's tolerance of                            previous anesthesia was also reviewed. The risks                            and benefits of the procedure and the sedation                            options and risks were discussed with the patient.                            All questions were answered, and informed consent                            was obtained. Prior Anticoagulants: The patient has                            taken no previous anticoagulant or antiplatelet                            agents. ASA Grade Assessment: II - A patient with                            mild systemic disease. After reviewing the risks                            and benefits, the patient was deemed in  satisfactory condition to undergo the procedure.                           After obtaining informed consent, the colonoscope                            was passed under direct vision. Throughout the                            procedure, the patient's blood pressure, pulse, and                            oxygen saturations were monitored continuously. The                            Olympus CF-HQ190 [id number]) Colonoscope was                             introduced through the anus and advanced to the the                            cecum, identified by appendiceal orifice and                            ileocecal valve. The colonoscopy was performed                            without difficulty. The patient tolerated the                            procedure well. The quality of the bowel                            preparation was excellent. The ileocecal valve,                            appendiceal orifice, and rectum were photographed. Scope In: 9:14:23 AM Scope Out: 9:31:38 AM Scope Withdrawal Time: 0 hours 14 minutes 1 second  Total Procedure Duration: 0 hours 17 minutes 15 seconds  Findings:                 The perianal and digital rectal examinations were                            normal.                           A few diverticula were found in the cecum.                           Two sessile polyps were found in the mid transverse                            colon and distal ascending colon. The polyps were 3  to 5 mm in size. These polyps were removed with a                            cold snare. Resection and retrieval were complete.                           A 6 mm polyp was found in the sigmoid colon. The                            polyp was pedunculated. The polyp was removed with                            a hot snare. Resection and retrieval were complete.                           The exam was otherwise without abnormality on                            direct and retroflexion views. Complications:            No immediate complications. Estimated Blood Loss:     Estimated blood loss was minimal. Impression:               - Diverticulosis in the cecum.                           - Two 3 to 5 mm polyps in the mid transverse colon                            and in the distal ascending colon, removed with a                            cold snare. Resected and retrieved.                            - One 6 mm polyp in the sigmoid colon, removed with                            a hot snare. Resected and retrieved.                           - The examination was otherwise normal on direct                            and retroflexion views. Recommendation:           - Patient has a contact number available for                            emergencies. The signs and symptoms of potential                            delayed complications were discussed with the  patient. Return to normal activities tomorrow.                            Written discharge instructions were provided to the                            patient.                           - Resume previous diet.                           - Continue present medications.                           - Await pathology results.                           - Repeat colonoscopy is recommended for                            surveillance. The colonoscopy date will be                            determined after pathology results from today's                            exam become available for review. Zaraya Delauder L. Loletha Carrow, MD 04/16/2021 9:36:37 AM This report has been signed electronically.

## 2021-04-16 NOTE — Patient Instructions (Signed)
Information on polyps and diverticulosis given to you today.  Await pathology results.  Resume previous diet and medications.  YOU HAD AN ENDOSCOPIC PROCEDURE TODAY AT THE [facility]:   Refer to the procedure report that was given to you for any specific questions about what was found during the examination.  If the procedure report does not answer your questions, please call your gastroenterologist to clarify.  If you requested that your care partner not be given the details of your procedure findings, then the procedure report has been included in a sealed envelope for you to review at your convenience later.  YOU SHOULD EXPECT: Some feelings of bloating in the abdomen. Passage of more gas than usual.  Walking can help get rid of the air that was put into your GI tract during the procedure and reduce the bloating. If you had a lower endoscopy (such as a colonoscopy or flexible sigmoidoscopy) you may notice spotting of blood in your stool or on the toilet paper. If you underwent a bowel prep for your procedure, you may not have a normal bowel movement for a few days.  Please Note:  You might notice some irritation and congestion in your nose or some drainage.  This is from the oxygen used during your procedure.  There is no need for concern and it should clear up in a day or so.  SYMPTOMS TO REPORT IMMEDIATELY:   Following lower endoscopy (colonoscopy or flexible sigmoidoscopy):  Excessive amounts of blood in the stool  Significant tenderness or worsening of abdominal pains  Swelling of the abdomen that is new, acute  Fever of 100F or higher   For urgent or emergent issues, a gastroenterologist can be reached at any hour by calling [phone]. Do not use MyChart messaging for urgent concerns.    DIET:  We do recommend a small meal at first, but then you may proceed to your regular diet.  Drink plenty of fluids but you should avoid alcoholic beverages for 24  hours.  ACTIVITY:  You should plan to take it easy for the rest of today and you should NOT DRIVE or use heavy machinery until tomorrow (because of the sedation medicines used during the test).    FOLLOW UP: Our staff will call the number listed on your records 48-72 hours following your procedure to check on you and address any questions or concerns that you may have regarding the information given to you following your procedure. If we do not reach you, we will leave a message.  We will attempt to reach you two times.  During this call, we will ask if you have developed any symptoms of COVID 19. If you develop any symptoms (ie: fever, flu-like symptoms, shortness of breath, cough etc.) before then, please call [phone].  If you test positive for Covid 19 in the 2 weeks post procedure, please call and report this information to us.    If any biopsies were taken you will be contacted by phone or by letter within the next 1-3 weeks.  Please call us at [phone] if you have not heard about the biopsies in 3 weeks.    SIGNATURES/CONFIDENTIALITY: You and/or your care partner have signed paperwork which will be entered into your electronic medical record.  These signatures attest to the fact that that the information above on your After Visit Summary has been reviewed and is understood.  Full responsibility of the confidentiality of this discharge information lies with you and/or   your care-partner. 

## 2021-04-20 ENCOUNTER — Telehealth: Payer: Self-pay

## 2021-04-20 ENCOUNTER — Telehealth: Payer: Self-pay | Admitting: *Deleted

## 2021-04-20 NOTE — Telephone Encounter (Signed)
  Follow up Call-  Call back number 04/16/2021  Post procedure Call Back phone  # [phone]  Permission to leave phone message Yes  Some recent data might be hidden     1st follow up call made.  NALM

## 2021-04-20 NOTE — Telephone Encounter (Signed)
Attempted 2nd f/u phone call. No answer. Left message.  °

## 2021-05-10 ENCOUNTER — Encounter: Payer: Self-pay | Admitting: Gastroenterology
